# Patient Record
Sex: Male | Born: 1955
Health system: Southern US, Community
[De-identification: ages and names within clinical notes are randomized; demographics above are authoritative.]

## PROBLEM LIST (undated history)

## (undated) DIAGNOSIS — M549 Dorsalgia, unspecified: Secondary | ICD-10-CM

## (undated) DIAGNOSIS — R319 Hematuria, unspecified: Secondary | ICD-10-CM

## (undated) DIAGNOSIS — G8929 Other chronic pain: Secondary | ICD-10-CM

## (undated) DIAGNOSIS — C61 Malignant neoplasm of prostate: Secondary | ICD-10-CM

## (undated) DIAGNOSIS — I10 Essential (primary) hypertension: Secondary | ICD-10-CM

## (undated) DIAGNOSIS — G709 Myoneural disorder, unspecified: Secondary | ICD-10-CM

## (undated) DIAGNOSIS — J302 Other seasonal allergic rhinitis: Secondary | ICD-10-CM

## (undated) DIAGNOSIS — C801 Malignant (primary) neoplasm, unspecified: Secondary | ICD-10-CM

## (undated) DIAGNOSIS — E039 Hypothyroidism, unspecified: Secondary | ICD-10-CM

## (undated) HISTORY — PX: BACK SURGERY: SHX140

## (undated) HISTORY — PX: KNEE ARTHROSCOPY: SUR90

## (undated) HISTORY — PX: NASAL SEPTOPLASTY W/ TURBINOPLASTY: SHX2070

---

## 1980-09-12 HISTORY — PX: APPENDECTOMY: SHX54

## 1998-01-16 ENCOUNTER — Ambulatory Visit (HOSPITAL_COMMUNITY): Admission: RE | Admit: 1998-01-16 | Discharge: 1998-01-16 | Payer: Self-pay | Admitting: Internal Medicine

## 1999-01-27 ENCOUNTER — Ambulatory Visit (HOSPITAL_COMMUNITY): Admission: RE | Admit: 1999-01-27 | Discharge: 1999-01-27 | Payer: Self-pay

## 2000-12-13 ENCOUNTER — Ambulatory Visit (HOSPITAL_COMMUNITY): Admission: RE | Admit: 2000-12-13 | Discharge: 2000-12-13 | Payer: Self-pay | Admitting: Pain Medicine

## 2000-12-13 ENCOUNTER — Encounter: Payer: Self-pay | Admitting: Pain Medicine

## 2002-01-14 ENCOUNTER — Encounter: Payer: Self-pay | Admitting: Pain Medicine

## 2002-01-14 ENCOUNTER — Ambulatory Visit (HOSPITAL_COMMUNITY): Admission: RE | Admit: 2002-01-14 | Discharge: 2002-01-14 | Payer: Self-pay | Admitting: Pain Medicine

## 2002-09-09 ENCOUNTER — Encounter: Payer: Self-pay | Admitting: Pain Medicine

## 2002-09-09 ENCOUNTER — Ambulatory Visit (HOSPITAL_COMMUNITY): Admission: RE | Admit: 2002-09-09 | Discharge: 2002-09-09 | Payer: Self-pay | Admitting: Pain Medicine

## 2006-06-02 ENCOUNTER — Ambulatory Visit (HOSPITAL_COMMUNITY): Admission: RE | Admit: 2006-06-02 | Discharge: 2006-06-02 | Payer: Self-pay | Admitting: Urology

## 2012-08-17 ENCOUNTER — Other Ambulatory Visit (HOSPITAL_COMMUNITY): Payer: Self-pay | Admitting: Urology

## 2012-08-17 DIAGNOSIS — R3129 Other microscopic hematuria: Secondary | ICD-10-CM

## 2012-08-17 DIAGNOSIS — D1800 Hemangioma unspecified site: Secondary | ICD-10-CM

## 2012-08-20 ENCOUNTER — Ambulatory Visit (HOSPITAL_COMMUNITY)
Admission: RE | Admit: 2012-08-20 | Discharge: 2012-08-20 | Disposition: A | Discharge status: Home / Self Care | Payer: 59 | Source: Ambulatory Visit | Attending: Urology | Admitting: Urology

## 2012-08-20 DIAGNOSIS — R3129 Other microscopic hematuria: Secondary | ICD-10-CM | POA: Insufficient documentation

## 2012-08-20 MED ORDER — IOHEXOL 300 MG/ML  SOLN
125.0000 mL | Freq: Once | INTRAMUSCULAR | Status: AC | PRN
  Administered 2012-08-20: 125 mL via INTRAVENOUS

## 2012-10-08 ENCOUNTER — Other Ambulatory Visit: Payer: Self-pay | Admitting: Urology

## 2012-11-12 ENCOUNTER — Encounter (HOSPITAL_COMMUNITY): Payer: Self-pay | Admitting: Pharmacy Technician

## 2012-11-15 ENCOUNTER — Encounter (HOSPITAL_COMMUNITY)
Admission: RE | Admit: 2012-11-15 | Discharge: 2012-11-15 | Disposition: A | Discharge status: Home / Self Care | Payer: 59 | Source: Ambulatory Visit | Attending: Urology | Admitting: Urology

## 2012-11-15 ENCOUNTER — Encounter (HOSPITAL_COMMUNITY): Payer: Self-pay

## 2012-11-15 ENCOUNTER — Ambulatory Visit (HOSPITAL_COMMUNITY)
Admission: RE | Admit: 2012-11-15 | Discharge: 2012-11-15 | Disposition: A | Discharge status: Home / Self Care | Payer: 59 | Source: Ambulatory Visit | Attending: Urology | Admitting: Urology

## 2012-11-15 DIAGNOSIS — Z01810 Encounter for preprocedural cardiovascular examination: Secondary | ICD-10-CM | POA: Insufficient documentation

## 2012-11-15 DIAGNOSIS — Z01818 Encounter for other preprocedural examination: Secondary | ICD-10-CM | POA: Insufficient documentation

## 2012-11-15 DIAGNOSIS — Z01812 Encounter for preprocedural laboratory examination: Secondary | ICD-10-CM | POA: Insufficient documentation

## 2012-11-15 HISTORY — DX: Other seasonal allergic rhinitis: J30.2

## 2012-11-15 HISTORY — DX: Hypothyroidism, unspecified: E03.9

## 2012-11-15 HISTORY — DX: Malignant (primary) neoplasm, unspecified: C80.1

## 2012-11-15 HISTORY — DX: Dorsalgia, unspecified: M54.9

## 2012-11-15 HISTORY — DX: Myoneural disorder, unspecified: G70.9

## 2012-11-15 HISTORY — DX: Essential (primary) hypertension: I10

## 2012-11-15 HISTORY — DX: Other chronic pain: G89.29

## 2012-11-15 LAB — COMPREHENSIVE METABOLIC PANEL
ALT: 19 U/L (ref 0–53)
Albumin: 4.1 g/dL (ref 3.5–5.2)
Alkaline Phosphatase: 86 U/L (ref 39–117)
Chloride: 99 mEq/L (ref 96–112)
GFR calc Af Amer: >90 mL/min (ref >90)
Glucose, Bld: 95 mg/dL (ref 70–99)
Potassium: 4.5 mEq/L (ref 3.5–5.1)
Sodium: 137 mEq/L (ref 135–145)
Total Protein: 7.5 g/dL (ref 6.0–8.3)

## 2012-11-15 LAB — CBC
Hemoglobin: 14.6 g/dL (ref 13.0–17.0)
MCHC: 34 g/dL (ref 30.0–36.0)
WBC: 9.1 10*3/uL (ref 4.0–10.5)

## 2012-11-15 NOTE — Pre-Procedure Instructions (Signed)
11-15-12 EKG/ CXR done today.

## 2012-11-15 NOTE — Patient Instructions (Addendum)
20 Roger Durham  11/15/2012   Your procedure is scheduled on:  3-12 -2014  Report to [facility] at       0600 AM.  Call this number if you have problems the morning of surgery: [phone]  Or Presurgical Testing [phone]([place])   Remember: Follow any bowel prep instructions per MD office.(Drink clear liquids plentiful)    Do not eat food:After Midnight.    Take these medicines the morning of surgery with A SIP OF WATER:    Do not wear jewelry, make-up or nail polish.  Do not wear lotions, powders, or perfumes. You may wear deodorant.  Do not shave 12 hours prior to first CHG shower(legs and under arms).(face and neck okay.)  Do not bring valuables to the hospital.  Contacts, dentures or bridgework,body piercing,  may not be worn into surgery.  Leave suitcase in the car. After surgery it may be brought to your room.  For patients admitted to the hospital, checkout time is 11:00 AM the day of discharge.   Patients discharged the day of surgery will not be allowed to drive home. Must have responsible person with you x 24 hours once discharged.  Name and phone number of your driver: [phone] cell- [name]-spouse  Special Instructions: CHG(Chlorhedine 4%-"Hibiclens","Betasept","Aplicare") Shower Use Special Wash: see special instructions.(avoid face and genitals)   Please read over the following fact sheets that you were given: MRSA Information, Blood Transfusion fact sheet, Incentive Spirometry Instruction.    Failure to follow these instructions may result in Cancellation of your surgery.   Patient signature_______________________________________________________

## 2012-11-20 LAB — TYPE AND SCREEN: Antibody Screen: NEGATIVE

## 2012-11-20 NOTE — H&P (Signed)
History of Present Illness   Roger Durham  presents to discuss his recent prostate ultrasound and biopsy results. He was noted to have an elevated PSA of approximately 5.6. A digital rectal exam he did not have an enlarged prostate but there was a moderate amount of induration involving the right lobe of his prostate. Ultrasound revealed a prostate of approximately 24 g. Unfortunately biopsies were positive primarily on the right side. There 5/6 biopsies were positive. The majority of biopsy showed a Gleason's 4+3 equals 7 cancer with 30-60% core involvement. There was one core of Gleason's 6. On the left side there was one biopsy positive. This was a Gleason's 3+3 equals 6 cancer involving 5% of the core at the left base. He had several additional biopsies that showed high-grade PIN/atypia. Patient is felt to have clinical stage TIIb  tumor.   Past Medical History Problems  1. History of  Hypertension 401.9 2. History of  Hypothyroidism 244.9  Surgical History Problems  1. History of  Back Surgery  Current Meds 1. Adult Aspirin Low Strength 81 MG Oral Tablet Dispersible; Therapy: (Recorded:05Dec2013) to 2. Levofloxacin 500 MG Oral Tablet; TAKE 1 TABLET Daily Start taking the day prior to procedure  date; Therapy: 05Dec2013 to (Evaluate:08Dec2013); Last Rx:05Dec2013 3. Levothyroxine Sodium 75 MCG Oral Tablet; Therapy: (Recorded:05Dec2013) to 4. Lisinopril TABS; Therapy: (Recorded:05Dec2013) to 5. Methadone HCl 10 MG Oral Tablet; Therapy: (Recorded:05Dec2013) to 6. Simvastatin 40 MG Oral Tablet; Therapy: (Recorded:05Dec2013) to  Allergies Medication  1. No Known Drug Allergies  Family History Problems  1. Paternal history of  Death In The Family Father 52yrs 2. Maternal history of  Death In The Family Mother 40yrs 3. Family history of  Family Health Status Number Of Children 1 son and 1 daughter 4. Family history of  No Significant Family History  Social History Problems  1.  Activities Of Daily Living 2. Caffeine Use 3 qd 3. Exercise Habits No formal exercise at this time however patient is independent with all chores around the home and with gardening activities. 4. Living Independently With Spouse 5. Marital History - Currently Married 6. Occupation: retired Garment/textile technologist 7. Self-reliant In Usual Daily Activities 8. Tobacco Use 305.1 1/2 ppd for 40yrs Denied  9. History of  Alcohol Use  Review of Systems Genitourinary, constitutional, skin, eye, otolaryngeal, hematologic/lymphatic, cardiovascular, pulmonary, endocrine, musculoskeletal, gastrointestinal, neurological and psychiatric system(s) were reviewed and pertinent findings if present are noted.  Genitourinary: urinary frequency, feelings of urinary urgency and erectile dysfunction.  Gastrointestinal: no flank pain and no abdominal pain.  Musculoskeletal: back pain.   Physical Exam Constitutional: Well nourished and well developed . No acute distress.  ENT:. The ears and nose are normal in appearance.  Neck: The appearance of the neck is normal and no neck mass is present.  Pulmonary: No respiratory distress and normal respiratory rhythm and effort.  Cardiovascular: Heart rate and rhythm are normal . No peripheral edema.  Abdomen: The abdomen is soft and nontender. No masses are palpated. No CVA tenderness. No hernias are palpable. No hepatosplenomegaly noted.  Rectal: Rectal exam demonstrates normal sphincter tone, no tenderness and no masses. Estimated prostate size is 1+. The prostate has no nodularity, is indurated involving the apex, mid aspect of the prostate which appears to be confined within the prostate capsule and is not tender. The left seminal vesicle is nonpalpable. The right seminal vesicle is nonpalpable. The perineum is normal on inspection.  Genitourinary: Examination of the penis demonstrates no discharge, no masses,  no lesions and a normal meatus. The scrotum is without lesions.  The right epididymis is palpably normal and non-tender. The left epididymis is palpably normal and non-tender. The right testis is non-tender and without masses. The left testis is non-tender and without masses.  Lymphatics: The femoral and inguinal nodes are not enlarged or tender.  Skin: Normal skin turgor, no visible rash and no visible skin lesions.  Neuro/Psych:. Mood and affect are appropriate.     Assessment Assessed  1. Prostate Cancer 185  Plan Prostate Cancer (185)  1. Follow-up Keep Future Appt Office  Follow-up  Requested for: 12Feb2014  Discussion/Summary  The patient was counseled about the natural history of prostate cancer and the standard treatment options that are available for prostate cancer. It was explained to him how his age and life expectancy, clinical stage, Gleason score, and PSA affect his prognosis, the decision to proceed with additional staging studies, as well as how that information influences recommended treatment strategies. We discussed the roles for active surveillance, radiation therapy, surgical therapy, androgen deprivation, as well as ablative therapy options for the treatment of prostate cancer as appropriate to his individual cancer situation. We discussed the risks and benefits of these options with regard to their impact on cancer control and also in terms of potential adverse events, complications, and impact on quiality of life particularly related to urinary, bowel, and sexual function. The patient was encouraged to ask questions throughout the discussion today and all questions were answered to his stated satisfaction. In addition, the patient was provided with and/or directed to appropriate resources and literature for further education about prostate cancer and treatment options.   We discussed surgical therapy for prostate cancer including the different available surgical approaches. We discussed, in detail, the risks and expectations of surgery  with regard to cancer control, urinary control, and erectile function as well as the expected postoperative recovery process. The risks, potential complications/adverse events of radical prostatectomy as well as alternative options were explained to the patient.   We discussed surgical therapy for prostate cancer including the different available surgical approaches. We discussed, in detail, the risks and expectations of surgery with regard to cancer control, urinary control, and erectile function as well as the expected postoperative recovery process. Additional risks of surgery including but not limited to bleeding, infection, hernia formation, nerve damage, lymphocele formation, bowel/rectal injury potentially necessitating colostomy, damage to the urinary tract resulting in urine leakage, urethral stricture, and the cardiopulmonary risks such as myocardial infarction, stroke, death, venothromboembolism, etc. were explained. The risk of open surgical conversion for robotic/laparoscopic prostatectomy was also discussed.   45 minutes were spent in face to face consultation with patient today.    SignaturesElectronically signed by : Barron Alvine, M.D.; Oct 29 2012  9:17AM

## 2012-11-21 ENCOUNTER — Encounter (HOSPITAL_COMMUNITY)
Admission: RE | Disposition: A | Discharge status: Home / Self Care | Payer: Self-pay | Source: Ambulatory Visit | Attending: Urology

## 2012-11-21 ENCOUNTER — Observation Stay (HOSPITAL_COMMUNITY)
Admission: RE | Admit: 2012-11-21 | Discharge: 2012-11-22 | Disposition: A | Discharge status: Home / Self Care | Payer: 59 | Source: Ambulatory Visit | Attending: Urology | Admitting: Urology

## 2012-11-21 ENCOUNTER — Encounter (HOSPITAL_COMMUNITY): Payer: Self-pay | Admitting: Anesthesiology

## 2012-11-21 ENCOUNTER — Encounter (HOSPITAL_COMMUNITY): Payer: Self-pay | Admitting: *Deleted

## 2012-11-21 ENCOUNTER — Ambulatory Visit (HOSPITAL_COMMUNITY): Payer: 59 | Admitting: Anesthesiology

## 2012-11-21 DIAGNOSIS — Z79899 Other long term (current) drug therapy: Secondary | ICD-10-CM | POA: Insufficient documentation

## 2012-11-21 DIAGNOSIS — Z7982 Long term (current) use of aspirin: Secondary | ICD-10-CM | POA: Insufficient documentation

## 2012-11-21 DIAGNOSIS — I10 Essential (primary) hypertension: Secondary | ICD-10-CM | POA: Insufficient documentation

## 2012-11-21 DIAGNOSIS — E039 Hypothyroidism, unspecified: Secondary | ICD-10-CM | POA: Insufficient documentation

## 2012-11-21 DIAGNOSIS — C61 Malignant neoplasm of prostate: Principal | ICD-10-CM | POA: Insufficient documentation

## 2012-11-21 HISTORY — PX: LYMPHADENECTOMY: SHX5960

## 2012-11-21 HISTORY — PX: ROBOT ASSISTED LAPAROSCOPIC RADICAL PROSTATECTOMY: SHX5141

## 2012-11-21 LAB — HEMOGLOBIN AND HEMATOCRIT, BLOOD: HCT: 39.6 % (ref 39.0–52.0)

## 2012-11-21 SURGERY — ROBOTIC ASSISTED LAPAROSCOPIC RADICAL PROSTATECTOMY
Anesthesia: General | Wound class: Clean Contaminated

## 2012-11-21 MED ORDER — ACETAMINOPHEN 10 MG/ML IV SOLN
1000.0000 mg | Freq: Four times a day (QID) | INTRAVENOUS | Status: AC
  Administered 2012-11-21 – 2012-11-22 (×4): 1000 mg via INTRAVENOUS
  Filled 2012-11-21 (×4): qty 100

## 2012-11-21 MED ORDER — PROMETHAZINE HCL 25 MG/ML IJ SOLN: 6.2500 mg | INTRAMUSCULAR | Status: DC | PRN

## 2012-11-21 MED ORDER — MORPHINE SULFATE 2 MG/ML IJ SOLN: 2.0000 mg | INTRAMUSCULAR | Status: DC | PRN

## 2012-11-21 MED ORDER — NEOSTIGMINE METHYLSULFATE 1 MG/ML IJ SOLN
INTRAMUSCULAR | Status: DC | PRN
  Administered 2012-11-21: 4 mg via INTRAVENOUS

## 2012-11-21 MED ORDER — BUPIVACAINE-EPINEPHRINE 0.25% -1:200000 IJ SOLN
INTRAMUSCULAR | Status: DC | PRN
  Administered 2012-11-21: 26 mL

## 2012-11-21 MED ORDER — ROCURONIUM BROMIDE 100 MG/10ML IV SOLN
INTRAVENOUS | Status: DC | PRN
  Administered 2012-11-21: 60 mg via INTRAVENOUS
  Administered 2012-11-21 (×2): 20 mg via INTRAVENOUS

## 2012-11-21 MED ORDER — CIPROFLOXACIN HCL 500 MG PO TABS: 500.0000 mg | ORAL_TABLET | Freq: Two times a day (BID) | ORAL | Status: DC

## 2012-11-21 MED ORDER — ONDANSETRON HCL 4 MG/2ML IJ SOLN
INTRAMUSCULAR | Status: DC | PRN
  Administered 2012-11-21: 4 mg via INTRAVENOUS

## 2012-11-21 MED ORDER — HYDROMORPHONE HCL PF 1 MG/ML IJ SOLN
INTRAMUSCULAR | Status: AC
  Filled 2012-11-21: qty 1

## 2012-11-21 MED ORDER — SIMVASTATIN 20 MG PO TABS
20.0000 mg | ORAL_TABLET | Freq: Every day | ORAL | Status: DC
  Filled 2012-11-21 (×2): qty 1

## 2012-11-21 MED ORDER — LACTATED RINGERS IV SOLN
INTRAVENOUS | Status: DC | PRN
  Administered 2012-11-21: 11:00:00

## 2012-11-21 MED ORDER — SODIUM CHLORIDE 0.9 % IR SOLN
Status: DC | PRN
  Administered 2012-11-21: 1000 mL

## 2012-11-21 MED ORDER — INDIGOTINDISULFONATE SODIUM 8 MG/ML IJ SOLN
INTRAMUSCULAR | Status: AC
  Filled 2012-11-21: qty 10

## 2012-11-21 MED ORDER — ACETAMINOPHEN 10 MG/ML IV SOLN
INTRAVENOUS | Status: DC | PRN
  Administered 2012-11-21: 1000 mg via INTRAVENOUS

## 2012-11-21 MED ORDER — MEPERIDINE HCL 50 MG/ML IJ SOLN: 6.2500 mg | INTRAMUSCULAR | Status: DC | PRN

## 2012-11-21 MED ORDER — STERILE WATER FOR IRRIGATION IR SOLN
Status: DC | PRN
  Administered 2012-11-21: 3000 mL

## 2012-11-21 MED ORDER — HYDROMORPHONE HCL PF 1 MG/ML IJ SOLN
INTRAMUSCULAR | Status: DC | PRN
  Administered 2012-11-21 (×2): 1 mg via INTRAVENOUS

## 2012-11-21 MED ORDER — AMLODIPINE BESY-BENAZEPRIL HCL 5-10 MG PO CAPS: 1.0000 | ORAL_CAPSULE | Freq: Every morning | ORAL | Status: DC

## 2012-11-21 MED ORDER — METHADONE HCL 10 MG PO TABS
10.0000 mg | ORAL_TABLET | Freq: Three times a day (TID) | ORAL | Status: DC
  Filled 2012-11-21: qty 1

## 2012-11-21 MED ORDER — CEFAZOLIN SODIUM-DEXTROSE 2-3 GM-% IV SOLR
2.0000 g | INTRAVENOUS | Status: AC
  Administered 2012-11-21: 2 g via INTRAVENOUS

## 2012-11-21 MED ORDER — CEFAZOLIN SODIUM-DEXTROSE 2-3 GM-% IV SOLR
INTRAVENOUS | Status: AC
  Filled 2012-11-21: qty 50

## 2012-11-21 MED ORDER — KCL IN DEXTROSE-NACL 10-5-0.45 MEQ/L-%-% IV SOLN
INTRAVENOUS | Status: DC
  Administered 2012-11-21 – 2012-11-22 (×3): via INTRAVENOUS
  Filled 2012-11-21 (×5): qty 1000

## 2012-11-21 MED ORDER — MIDAZOLAM HCL 5 MG/5ML IJ SOLN
INTRAMUSCULAR | Status: DC | PRN
  Administered 2012-11-21: 2 mg via INTRAVENOUS

## 2012-11-21 MED ORDER — HEPARIN SODIUM (PORCINE) 1000 UNIT/ML IJ SOLN
INTRAMUSCULAR | Status: AC
  Filled 2012-11-21: qty 1

## 2012-11-21 MED ORDER — PROPOFOL 10 MG/ML IV BOLUS
INTRAVENOUS | Status: DC | PRN
  Administered 2012-11-21: 200 mg via INTRAVENOUS

## 2012-11-21 MED ORDER — FENTANYL CITRATE 0.05 MG/ML IJ SOLN
INTRAMUSCULAR | Status: DC | PRN
  Administered 2012-11-21: 100 ug via INTRAVENOUS
  Administered 2012-11-21: 150 ug via INTRAVENOUS

## 2012-11-21 MED ORDER — ACETAMINOPHEN 10 MG/ML IV SOLN
INTRAVENOUS | Status: AC
  Filled 2012-11-21: qty 100

## 2012-11-21 MED ORDER — LEVOTHYROXINE SODIUM 75 MCG PO TABS
75.0000 ug | ORAL_TABLET | Freq: Every day | ORAL | Status: DC
  Filled 2012-11-21 (×2): qty 1

## 2012-11-21 MED ORDER — BENAZEPRIL HCL 10 MG PO TABS
10.0000 mg | ORAL_TABLET | Freq: Every day | ORAL | Status: DC
  Filled 2012-11-21: qty 1

## 2012-11-21 MED ORDER — LACTATED RINGERS IV SOLN
INTRAVENOUS | Status: DC | PRN
  Administered 2012-11-21 (×2): via INTRAVENOUS

## 2012-11-21 MED ORDER — KETAMINE HCL 50 MG/ML IJ SOLN
INTRAMUSCULAR | Status: DC | PRN
  Administered 2012-11-21: 25 mg via INTRAMUSCULAR

## 2012-11-21 MED ORDER — HYDROMORPHONE HCL PF 1 MG/ML IJ SOLN
0.2500 mg | INTRAMUSCULAR | Status: DC | PRN
  Administered 2012-11-21: 0.5 mg via INTRAVENOUS

## 2012-11-21 MED ORDER — SODIUM CHLORIDE 0.9 % IV BOLUS (SEPSIS)
1000.0000 mL | Freq: Once | INTRAVENOUS | Status: AC
  Administered 2012-11-21: 1000 mL via INTRAVENOUS

## 2012-11-21 MED ORDER — INDIGOTINDISULFONATE SODIUM 8 MG/ML IJ SOLN
INTRAMUSCULAR | Status: DC | PRN
  Administered 2012-11-21 (×2): 5 mL via INTRAVENOUS

## 2012-11-21 MED ORDER — BUPIVACAINE-EPINEPHRINE PF 0.25-1:200000 % IJ SOLN
INTRAMUSCULAR | Status: AC
  Filled 2012-11-21: qty 30

## 2012-11-21 MED ORDER — HEMOSTATIC AGENTS (NO CHARGE) OPTIME
TOPICAL | Status: DC | PRN
  Administered 2012-11-21: 1

## 2012-11-21 MED ORDER — LACTATED RINGERS IV SOLN: INTRAVENOUS | Status: DC

## 2012-11-21 MED ORDER — AMLODIPINE BESYLATE 5 MG PO TABS
5.0000 mg | ORAL_TABLET | Freq: Every day | ORAL | Status: DC
  Filled 2012-11-21: qty 1

## 2012-11-21 MED ORDER — GLYCOPYRROLATE 0.2 MG/ML IJ SOLN
INTRAMUSCULAR | Status: DC | PRN
  Administered 2012-11-21: .5 mg via INTRAVENOUS

## 2012-11-21 MED ORDER — AMLODIPINE BESYLATE 5 MG PO TABS
5.0000 mg | ORAL_TABLET | ORAL | Status: DC
  Filled 2012-11-21: qty 1

## 2012-11-21 MED ORDER — EPHEDRINE SULFATE 50 MG/ML IJ SOLN
INTRAMUSCULAR | Status: DC | PRN
  Administered 2012-11-21 (×2): 5 mg via INTRAVENOUS

## 2012-11-21 MED ORDER — KCL IN DEXTROSE-NACL 10-5-0.45 MEQ/L-%-% IV SOLN
INTRAVENOUS | Status: AC
  Filled 2012-11-21: qty 1000

## 2012-11-21 SURGICAL SUPPLY — 51 items
AGENT HMST MTR 8 SURGIFLO (HEMOSTASIS) ×2
APL ESCP 34 STRL LF DISP (HEMOSTASIS) ×2
APPLICATOR SURGIFLO ENDO (HEMOSTASIS) ×1 IMPLANT
CANISTER SUCTION 2500CC (MISCELLANEOUS) ×3 IMPLANT
CATH FOLEY 2WAY SLVR 18FR 30CC (CATHETERS) ×3 IMPLANT
CATH ROBINSON RED A/P 16FR (CATHETERS) ×3 IMPLANT
CATH ROBINSON RED A/P 8FR (CATHETERS) ×3 IMPLANT
CATH TIEMANN FOLEY 18FR 5CC (CATHETERS) ×3 IMPLANT
CHLORAPREP W/TINT 26ML (MISCELLANEOUS) ×3 IMPLANT
CLIP LIGATING HEM O LOK PURPLE (MISCELLANEOUS) IMPLANT
CLOTH BEACON ORANGE TIMEOUT ST (SAFETY) ×3 IMPLANT
CORD HIGH FREQUENCY UNIPOLAR (ELECTROSURGICAL) ×3 IMPLANT
COVER SURGICAL LIGHT HANDLE (MISCELLANEOUS) ×3 IMPLANT
COVER TIP SHEARS 8 DVNC (MISCELLANEOUS) ×2 IMPLANT
COVER TIP SHEARS 8MM DA VINCI (MISCELLANEOUS) ×1
CUTTER ECHEON FLEX ENDO 45 340 (ENDOMECHANICALS) ×3 IMPLANT
DECANTER SPIKE VIAL GLASS SM (MISCELLANEOUS) ×3 IMPLANT
DRAPE SURG IRRIG POUCH 19X23 (DRAPES) ×3 IMPLANT
DRSG TEGADERM 2-3/8X2-3/4 SM (GAUZE/BANDAGES/DRESSINGS) ×12 IMPLANT
DRSG TEGADERM 4X4.75 (GAUZE/BANDAGES/DRESSINGS) ×6 IMPLANT
DRSG TEGADERM 6X8 (GAUZE/BANDAGES/DRESSINGS) ×9 IMPLANT
DRSG TEGADERM 8X12 (GAUZE/BANDAGES/DRESSINGS) ×2 IMPLANT
ELECT REM PT RETURN 9FT ADLT (ELECTROSURGICAL) ×3
ELECTRODE REM PT RTRN 9FT ADLT (ELECTROSURGICAL) ×2 IMPLANT
GAUZE SPONGE 2X2 8PLY STRL LF (GAUZE/BANDAGES/DRESSINGS) ×2 IMPLANT
GLOVE BIO SURGEON STRL SZ 6.5 (GLOVE) ×3 IMPLANT
GLOVE BIOGEL M STRL SZ7.5 (GLOVE) ×6 IMPLANT
GOWN PREVENTION PLUS XLARGE (GOWN DISPOSABLE) ×4 IMPLANT
GOWN STRL NON-REIN LRG LVL3 (GOWN DISPOSABLE) ×2 IMPLANT
GOWN STRL REIN XL XLG (GOWN DISPOSABLE) ×6 IMPLANT
HOLDER FOLEY CATH W/STRAP (MISCELLANEOUS) ×3 IMPLANT
IV LACTATED RINGERS 1000ML (IV SOLUTION) ×3 IMPLANT
KIT ACCESSORY DA VINCI DISP (KITS) ×1
KIT ACCESSORY DVNC DISP (KITS) ×2 IMPLANT
NDL SAFETY ECLIPSE 18X1.5 (NEEDLE) ×2 IMPLANT
NEEDLE HYPO 18GX1.5 SHARP (NEEDLE) ×3
PACK ROBOT UROLOGY CUSTOM (CUSTOM PROCEDURE TRAY) ×3 IMPLANT
RELOAD GREEN ECHELON 45 (STAPLE) ×3 IMPLANT
SEALER TISSUE G2 CVD JAW 45CM (ENDOMECHANICALS) ×1 IMPLANT
SET TUBE IRRIG SUCTION NO TIP (IRRIGATION / IRRIGATOR) ×3 IMPLANT
SOLUTION ELECTROLUBE (MISCELLANEOUS) ×3 IMPLANT
SPOGE SURGIFLO 8M (HEMOSTASIS) ×1
SPONGE GAUZE 2X2 STER 10/PKG (GAUZE/BANDAGES/DRESSINGS)
SPONGE SURGIFLO 8M (HEMOSTASIS) IMPLANT
SUT ETHILON 3 0 PS 1 (SUTURE) ×1 IMPLANT
SUT VIC AB 2-0 SH 27 (SUTURE) ×3
SUT VIC AB 2-0 SH 27X BRD (SUTURE) ×2 IMPLANT
SUT VICRYL 0 UR6 27IN ABS (SUTURE) ×3 IMPLANT
SYR 27GX1/2 1ML LL SAFETY (SYRINGE) ×3 IMPLANT
TOWEL OR NON WOVEN STRL DISP B (DISPOSABLE) ×3 IMPLANT
WATER STERILE IRR 1500ML POUR (IV SOLUTION) ×4 IMPLANT

## 2012-11-21 NOTE — Progress Notes (Signed)
Patient ID: Roger Durham, male   DOB: May 02, 1956, 57 y.o.   MRN: [id number] Post-op note  Subjective: The patient is doing well.  No complaints.  Denies N/V.  Has not ambulated yet  Objective: Vital signs in last 24 hours: Temp:  [97.3 F (36.3 C)-98 F (36.7 C)] 98 F (36.7 C) (03/12 1331) Pulse Rate:  [66-85] 70 (03/12 1331) Resp:  [9-18] 12 (03/12 1331) BP: (105-144)/(64-75) 105/66 mmHg (03/12 1331) SpO2:  [94 %-100 %] 96 % (03/12 1331) Weight:  [92.987 kg (205 lb)] 92.987 kg (205 lb) (03/12 1331)  Intake/Output from previous day:   Intake/Output this shift: Total I/O In: 3075 [I.V.:2075; IV Piggyback:1000] Out: 205 [Urine:125; Drains:30; Blood:50]  Physical Exam:  General: Alert and oriented. Abdomen: Soft, Nondistended. Incisions: Clean and dry.  Lab Results:  Recent Labs  11/21/12 1149  HGB 13.1  HCT 39.6    Assessment/Plan: POD#0   1) Continue to monitor 2) Amb, IS, pain control, DVT prophy    LOS: 0 days   Silas Flood. 11/21/2012, 2:29 PM

## 2012-11-21 NOTE — Anesthesia Preprocedure Evaluation (Addendum)
Anesthesia Evaluation  Patient identified by MRN, date of birth, ID band Patient awake    Reviewed: Allergy & Precautions, H&P , NPO status , Patient's Chart, lab work & pertinent test results  Airway Mallampati: II TM Distance: >3 FB Neck ROM: Full    Dental no notable dental hx. (+) Edentulous Upper and Edentulous Lower   Pulmonary neg pulmonary ROS, former smoker,  breath sounds clear to auscultation  Pulmonary exam normal       Cardiovascular hypertension, Pt. on medications Rhythm:Regular Rate:Normal     Neuro/Psych negative neurological ROS  negative psych ROS   GI/Hepatic negative GI ROS, Neg liver ROS,   Endo/Other  Hypothyroidism   Renal/GU negative Renal ROS  negative genitourinary   Musculoskeletal   Abdominal   Peds negative pediatric ROS (+)  Hematology negative hematology ROS (+)   Anesthesia Other Findings   Reproductive/Obstetrics negative OB ROS                         Anesthesia Physical Anesthesia Plan  ASA: II  Anesthesia Plan: General   Post-op Pain Management:    Induction: Intravenous  Airway Management Planned: Oral ETT  Additional Equipment:   Intra-op Plan:   Post-operative Plan: Extubation in OR  Informed Consent: I have reviewed the patients History and Physical, chart, labs and discussed the procedure including the risks, benefits and alternatives for the proposed anesthesia with the patient or authorized representative who has indicated his/her understanding and acceptance.   Dental advisory given  Plan Discussed with: CRNA  Anesthesia Plan Comments:         Anesthesia Quick Evaluation

## 2012-11-21 NOTE — Transfer of Care (Signed)
Immediate Anesthesia Transfer of Care Note  Patient: Roger Durham  Procedure(s) Performed: Procedure(s) with comments: ROBOTIC ASSISTED LAPAROSCOPIC RADICAL PROSTATECTOMY (N/A) - WITH BILATERAL PELVIC LYMPH NODE DISSECTION  LYMPHADENECTOMY (Bilateral)  Patient Location: PACU  Anesthesia Type:General  Level of Consciousness: oriented, sedated, patient cooperative and responds to stimulation  Airway & Oxygen Therapy: Patient Spontanous Breathing and Patient connected to face mask oxygen  Post-op Assessment: Report given to PACU RN, Post -op Vital signs reviewed and stable and Patient moving all extremities X 4  Post vital signs: Reviewed and stable  Complications: No apparent anesthesia complications

## 2012-11-21 NOTE — Progress Notes (Signed)
Hgb. And Hct. Drawn by lab. 

## 2012-11-21 NOTE — Anesthesia Postprocedure Evaluation (Signed)
  Anesthesia Post-op Note  Patient: Roger Durham  Procedure(s) Performed: Procedure(s) (LRB): ROBOTIC ASSISTED LAPAROSCOPIC RADICAL PROSTATECTOMY (N/A) LYMPHADENECTOMY (Bilateral)  Patient Location: PACU  Anesthesia Type: General  Level of Consciousness: awake and alert   Airway and Oxygen Therapy: Patient Spontanous Breathing  Post-op Pain: mild  Post-op Assessment: Post-op Vital signs reviewed, Patient's Cardiovascular Status Stable, Respiratory Function Stable, Patent Airway and No signs of Nausea or vomiting  Last Vitals:  Filed Vitals:   11/21/12 1145  BP: 144/74  Pulse: 71  Temp:   Resp: 9    Post-op Vital Signs: stable   Complications: No apparent anesthesia complications

## 2012-11-21 NOTE — Progress Notes (Signed)
Patient ambulated 2x in the hall today.  Tolerated very well.  States pain is manageable and denies needing pain medication.

## 2012-11-21 NOTE — Op Note (Signed)
Preoperative diagnosis: Clinical stage T1c Adenocarcinoma prostate  Postoperative diagnosis: Same  Procedure: Robotic-assisted laparoscopic radical retropubic prostatectomy with bilateral pelvic lymph node dissection  Surgeon: Valetta Fuller, MD  Asst.: Pecola Leisure, PA Anesthesia: Gen. Endotracheal  Indications: Patient was diagnosed with clinical stage TIc Adenocarcinoma the prostate. He underwent extensive consultation with regard to treatment options. The patient decided on a surgical approach. He appeared to understand the distinct advantages as well as the disadvantages of this procedure. The patient has performed a mechanical bowel prep. He has had placement of PAS compression boots and has received perioperative antibiotics. The patient's preoperative PSA was 5.6. Ultrasound revealed a 23 g prostate.   Technique and findings:The patient was brought to the operating room and had successful induction of general endotracheal anesthesia.the patient was placed in a low lithotomy position with careful padding of all extremities. He was secured to the operative table and placed in the steep Trendelenburg position. He was prepped and draped in usual manner. A Foley catheter was placed sterilely on the field. Camera port site was chosen 18 cm above the pubic symphysis just to the left of the umbilicus. A standard open Hassan technique was utilized. A 12 mm trocar was placed without difficulty. The camera was then inserted and no abnormalities were noted within the pelvis. The trochars were placed with direct visual guidance. This included 3 8mm robotic trochars and a 12 mm and 5 mm assist ports. Once all the ports were placed the robot was docked. The bladder was filled and the space of Retzius was developed with electrocautery dissection as well as blunt dissection. Superficial fat off the endopelvic fascia and bladder neck was removed with electrocautery scissors. The endopelvic fascia was then incised  bilaterally from base to apex. Levator musculature was swept off the apex of the prostate isolating the dorsal venous complex which was then stapled with the ETS stapling device. The anterior bladder neck was identified with the aid of the Foley balloon. This was then transected down to the Foley catheter with electrocautery scissors. The Foley catheter was then retracted anteriorly. Indigo carmine was given and we appeared to be well away from the ureteral orifices. The posterior bladder neck was then transected and the dissection carried down to the adnexal structures. The seminal vesicles and vas deferens on both sides were then individually dissected free and retracted anteriorly. The posterior plane between the rectum and prostate was then established primarily with blunt dissection.  Attention was then turned towards nerve sparing. The patient was felt to be a candidate for left-sided nerve sparing and wide excision on the right. Superficial fascia along the anterior lateral aspect of the prostate was incised on the left side. This tissue was then swept laterally until we were able to establish a groove between the neurovascular tissue and the posterior lateral aspect on the prostate. This groove was then extended from the apex back to the base of the prostate. With the prostate retracted anteriorly the vascular pedicles of the prostate were taken with the Enseal device. The Foley catheter was then reinserted and the anterior urethra was transected. The posterior urethra was then transected as were some rectourethralis fibers. The prostate was then removed from the pelvis. The pelvis was then copiously irrigated. Rectal insufflation was performed and there was no evidence of rectal injury.  Attention was then turned towards bilateral pelvic lymph node dissection. The obturator node packets were removed I laterally and the dissection extended towards the bifurcation of the iliac  artery. The obturator nerve  was identified on both sides and preserved. Hemalock clips were used for small veins and lymphatic channels. The node packets were sent for permanent analysis.  Attention was then turned towards reconstruction. The bladder neck did not require any reconstruction. The bladder neck and posterior urethra were reapproximated at the 6:00 position utilizing a 2-0 Vicryl suture. The rest of the anastomosis was done with a double-armed 3-0 Monocryl suture in a 360 degree manner. Additional indigo carmine was given. A new catheter was placed and bladder irrigation revealed no evidence of leakage. A Blake drain was placed through one of the robotic trochars and positioned in the retropubic space above the anastomosis. This was then secured to the skin with a nylon suture. The prostate was placed in the Endopouch retrieval bag. The 12 mm trocar site was closed with a Vicryl suture with the aid of a suture passer. Our other trochars were taken out with direct visual guidance without evidence of any bleeding. The camera port incision was extended slightly to allow for removal of the specimen and then closed with a running Vicryl suture. All port sites were infiltrated with Marcaine and then closed with surgical clips. The patient was then taken to recovery room having had no obvious complications or problems. Sponge and needle counts were correct.

## 2012-11-21 NOTE — Progress Notes (Signed)
Amlodipine omitted because patient took his at home this am.

## 2012-11-21 NOTE — Preoperative (Signed)
Beta Blockers   Reason not to administer Beta Blockers:Not Applicable 

## 2012-11-21 NOTE — Interval H&P Note (Signed)
History and Physical Interval Note:  11/21/2012 8:22 AM  Roger Durham  has presented today for surgery, with the diagnosis of PROSTATE CANCER  The various methods of treatment have been discussed with the patient and family. After consideration of risks, benefits and other options for treatment, the patient has consented to  Procedure(s) with comments: ROBOTIC ASSISTED LAPAROSCOPIC RADICAL PROSTATECTOMY (N/A) - WITH BILATERAL PELVIC LYMPH NODE DISSECTION  LYMPHADENECTOMY (Bilateral) as a surgical intervention .  The patient's history has been reviewed, patient examined, no change in status, stable for surgery.  I have reviewed the patient's chart and labs.  Questions were answered to the patient's satisfaction.     GRAPEY,DAVID S

## 2012-11-21 NOTE — Progress Notes (Signed)
Lab results noted- Hgb. 13.1- Hct. 39.6

## 2012-11-22 ENCOUNTER — Encounter (HOSPITAL_COMMUNITY): Payer: Self-pay | Admitting: Urology

## 2012-11-22 LAB — CREATININE, FLUID (PLEURAL, PERITONEAL, JP DRAINAGE): Creat, Fluid: 0.7 mg/dL

## 2012-11-22 LAB — BASIC METABOLIC PANEL
BUN: 10 mg/dL (ref 6–23)
Calcium: 8.7 mg/dL (ref 8.4–10.5)
GFR calc non Af Amer: >90 mL/min (ref >90)
Glucose, Bld: 118 mg/dL — ABNORMAL HIGH (ref 70–99)

## 2012-11-22 MED ORDER — BISACODYL 10 MG RE SUPP
10.0000 mg | Freq: Once | RECTAL | Status: AC
  Administered 2012-11-22: 10 mg via RECTAL
  Filled 2012-11-22: qty 1

## 2012-11-22 NOTE — Progress Notes (Signed)
1 Day Post-Op Subjective: Patient reports pain control good.He has ambulated several times. No real complaints.  Objective: Vital signs in last 24 hours: Temp:  [97.3 F (36.3 C)-98.8 F (37.1 C)] 98.1 F (36.7 C) (03/13 0539) Pulse Rate:  [63-79] 67 (03/13 0539) Resp:  [9-18] 16 (03/13 0539) BP: (105-144)/(59-77) 124/65 mmHg (03/13 0539) SpO2:  [94 %-100 %] 95 % (03/13 0539) Weight:  [92.987 kg (205 lb)] 92.987 kg (205 lb) (03/12 1331)  Intake/Output from previous day: 03/12 0701 - 03/13 0700 In: 6211.3 [P.O.:630; I.V.:4281.3; IV Piggyback:1300] Out: 3330 [Urine:2925; Drains:355; Blood:50] Intake/Output this shift:    Physical Exam:  General:alert and cooperative GI:  soft Male genitalia: not done Penis: catheter draining clear urine   Lab Results:  Recent Labs  11/21/12 1149 11/22/12 0430  HGB 13.1 14.1  HCT 39.6 41.7   BMET  Recent Labs  11/22/12 0430  NA 137  K 4.2  CL 103  CO2 26  GLUCOSE 118*  BUN 10  CREATININE 0.75  CALCIUM 8.7   No results found for this basename: LABPT, INR,  in the last 72 hours No results found for this basename: LABURIN,  in the last 72 hours Results for orders placed during the hospital encounter of 11/15/12  SURGICAL PCR SCREEN     Status: None   Collection Time    11/15/12  3:34 PM      Result Value Range Status   MRSA, PCR NEGATIVE  NEGATIVE Final   Staphylococcus aureus NEGATIVE  NEGATIVE Final   Comment:            The Xpert SA Assay (FDA     approved for NASAL specimens     in patients over 31 years of age),     is one component of     a comprehensive surveillance     program.  Test performance has     been validated by [employer] for patients greater     than or equal to 58 year old.     It is not intended     to diagnose infection nor to     guide or monitor treatment.    Studies/Results: No results found.  Assessment/Plan: 1 Day Post-Op Procedure(s) (LRB): ROBOTIC ASSISTED LAPAROSCOPIC  RADICAL PROSTATECTOMY (N/A) LYMPHADENECTOMY (Bilateral)  Clear liquids  Doubt urine leak but will send stat JP creat  Probable d/c home later today  Routine post-op   LOS: 1 day   GRAPEY,DAVID S 11/22/2012, 8:03 AM

## 2012-11-22 NOTE — Progress Notes (Signed)
Went to bedside to speak with patient and patient's wife to explain Link to [employer] as a benefit of having [employer]. Patient's wife is an Sports administrator [employer]. Patient reports he does not have any Link to Wellness needs at this time. Does not think he needs a post transition of care call either. Appreciative of visit. Left contact information with wife. Raiford Noble, MSN-Ed, RN,BSN, [facility], [phone]

## 2012-11-23 NOTE — Discharge Summary (Signed)
  Date of admission: 11/21/2012  Date of discharge: 11/23/2012  Admission diagnosis: Prostate Cancer  Discharge diagnosis: Prostate Cancer  History and Physical: For full details, please see admission history and physical. Briefly, Roger Durham is a 57 y.o. gentleman with localized prostate cancer.  After discussing management/treatment options, he elected to proceed with surgical treatment.  Hospital Course: Roger Durham was taken to the operating room on 11/21/2012 and underwent a robotic assisted laparoscopic radical prostatectomy. He tolerated this procedure well and without complications. Postoperatively, he was able to be transferred to a regular hospital room following recovery from anesthesia.  He was able to begin ambulating the night of surgery. He remained hemodynamically stable overnight.  He had excellent urine output.  He had increased drainage from the JP, therefore, a Cr level was checked.  This was found to be consistent with serum and the drain was d/c'd on POD #1.  He was transitioned to oral pain medication, tolerated a clear liquid diet, and had met all discharge criteria and was able to be discharged home later on POD#1.  Laboratory values:  Recent Labs  11/21/12 1149 11/22/12 0430  HGB 13.1 14.1  HCT 39.6 41.7    Disposition: Home  Discharge instruction: He was instructed to be ambulatory but to refrain from heavy lifting, strenuous activity, or driving. He was instructed on urethral catheter care.  Discharge medications:     Medication List    STOP taking these medications       aspirin EC 81 MG tablet      TAKE these medications       amLODipine-benazepril 5-10 MG per capsule  Commonly known as:  LOTREL  Take 1 capsule by mouth every morning.     ciprofloxacin 500 MG tablet  Commonly known as:  CIPRO  Take 1 tablet (500 mg total) by mouth 2 (two) times daily. Start day prior to office visit for foley removal     levothyroxine 75 MCG tablet  Commonly  known as:  SYNTHROID, LEVOTHROID  Take 75 mcg by mouth every morning.     methadone 10 MG tablet  Commonly known as:  DOLOPHINE  Take 10 mg by mouth 3 (three) times daily. For pain by pain management center     simvastatin 20 MG tablet  Commonly known as:  ZOCOR  Take 20 mg by mouth every evening.        Followup: He will followup in 1 week for catheter removal and to discuss his surgical pathology results.

## 2014-03-17 ENCOUNTER — Other Ambulatory Visit: Payer: Self-pay | Admitting: Gastroenterology

## 2015-09-30 ENCOUNTER — Encounter: Payer: Self-pay | Admitting: Radiation Oncology

## 2015-09-30 NOTE — Progress Notes (Signed)
GU Location of Tumor / Histology: prostatic adenocarcinoma with biochemical reoccurence  If Prostate Cancer, Gleason Score is (4 + 3) and PSA is (0.14) on 09/09/2015  Roger Durham is three years status post prostatectomy. His PSA was undetectable until May of 2016 when it proved to be 0.02 then in November 0.08. PSA 09/08/15 0.14.    Past/Anticipated interventions by urology, if any: prostatectomy, routine PSA checks, referral to radiation oncology for consideration of salvage radiotherapy  Past/Anticipated interventions by medical oncology, if any: no  Weight changes, if any: no  Bowel/Bladder complaints, if any: urinary frequency worse at night, feelings of intermittent urinary urgency, nocturia x 1-2 and mild erectile dysfunction, no dysuria, no hematuria, describes a strong steady urine stream, denies difficulty emptying his bladder, denies incontinence or leakage  Nausea/Vomiting, if any: no  Pain issues, if any:  Chronic back pain related to injury from 1994; manages back pain with methadone 10 mg tid.   SAFETY ISSUES:  Prior radiation? no  Pacemaker/ICD? no  Possible current pregnancy? no  Is the patient on methotrexate? no  Current Complaints / other details:  60 year old. Retired Music therapist. Married with one son and one daughter.

## 2015-10-01 ENCOUNTER — Ambulatory Visit
Admission: RE | Admit: 2015-10-01 | Discharge: 2015-10-01 | Disposition: A | Discharge status: Home / Self Care | Payer: 59 | Source: Ambulatory Visit | Attending: Radiation Oncology | Admitting: Radiation Oncology

## 2015-10-01 ENCOUNTER — Encounter: Payer: Self-pay | Admitting: Radiation Oncology

## 2015-10-01 VITALS — BP 126/75 | HR 66 | Resp 16 | Ht 67.0 in | Wt 206.0 lb

## 2015-10-01 DIAGNOSIS — M5412 Radiculopathy, cervical region: Secondary | ICD-10-CM | POA: Diagnosis not present

## 2015-10-01 DIAGNOSIS — M545 Low back pain: Secondary | ICD-10-CM | POA: Insufficient documentation

## 2015-10-01 DIAGNOSIS — E039 Hypothyroidism, unspecified: Secondary | ICD-10-CM | POA: Diagnosis not present

## 2015-10-01 DIAGNOSIS — M961 Postlaminectomy syndrome, not elsewhere classified: Secondary | ICD-10-CM | POA: Diagnosis not present

## 2015-10-01 DIAGNOSIS — C61 Malignant neoplasm of prostate: Secondary | ICD-10-CM

## 2015-10-01 DIAGNOSIS — M542 Cervicalgia: Secondary | ICD-10-CM | POA: Diagnosis not present

## 2015-10-01 DIAGNOSIS — Z510 Encounter for antineoplastic radiation therapy: Secondary | ICD-10-CM | POA: Insufficient documentation

## 2015-10-01 DIAGNOSIS — G8929 Other chronic pain: Secondary | ICD-10-CM | POA: Insufficient documentation

## 2015-10-01 DIAGNOSIS — R9721 Rising PSA following treatment for malignant neoplasm of prostate: Secondary | ICD-10-CM | POA: Diagnosis not present

## 2015-10-01 DIAGNOSIS — I10 Essential (primary) hypertension: Secondary | ICD-10-CM | POA: Insufficient documentation

## 2015-10-01 DIAGNOSIS — Z87891 Personal history of nicotine dependence: Secondary | ICD-10-CM | POA: Diagnosis not present

## 2015-10-01 DIAGNOSIS — M5440 Lumbago with sciatica, unspecified side: Secondary | ICD-10-CM | POA: Diagnosis not present

## 2015-10-01 DIAGNOSIS — Z9889 Other specified postprocedural states: Secondary | ICD-10-CM | POA: Insufficient documentation

## 2015-10-01 DIAGNOSIS — G709 Myoneural disorder, unspecified: Secondary | ICD-10-CM | POA: Diagnosis not present

## 2015-10-01 HISTORY — DX: Malignant neoplasm of prostate: C61

## 2015-10-01 NOTE — Progress Notes (Signed)
Radiation Oncology         [phone] ________________________________  Initial outpatient Consultation  Name: Roger Durham MRN: [id number]  Date: 10/01/2015  DOB: [birth year]  [id number] Roger John, MD  [name] Snare, MD   REFERRING PHYSICIAN: [name] Snare, MD  DIAGNOSIS: The encounter diagnosis was Prostate cancer [facility]).    ICD-9-CM ICD-10-CM   1. Prostate cancer ([place]) [place] is a pleasant 61 y.o. male with a history of stage IIB adenocarcinoma of the prostate. In 2014 he was found to have an adenocarcinoma of the prostate, and underwent a radical prostatectomy and final pathology revealed adenocarcinoma with Gleason score of 4+3. Margins were negative and he did not have any extra prostatitic disease. His PSA has been followed since that time, and had been undetectable until November 2016 when this was 0.08. On 09/08/2015 this had increased to 0.14. He comes to discuss the role of radiotherapy to the prosthetic fossa with Dr. Tammi Klippel.  PREVIOUS RADIATION THERAPY: No  PAST MEDICAL HISTORY:  has a past medical history of Chronic back pain; Hypertension; Hypothyroidism; Seasonal allergies; Neuromuscular disorder ([place]); Cancer [facility]); and Prostate cancer ([place]).    PAST SURGICAL HISTORY: Past Surgical History  Procedure Laterality Date  . Back surgery      multiple back surgery-retained hardware '08-Ray cage  . Nasal septoplasty w/ turbinoplasty      Seasonal allergies  . Appendectomy    . Knee arthroscopy      left knee-torn menicus  . Robot assisted laparoscopic radical prostatectomy N/A 11/21/2012    Procedure: ROBOTIC ASSISTED LAPAROSCOPIC RADICAL PROSTATECTOMY;  Surgeon: Bernestine Amass, MD;  Location: [facility] ORS;  Service: Urology;  Laterality: N/A;  WITH BILATERAL PELVIC LYMPH NODE DISSECTION   . Lymphadenectomy Bilateral 11/21/2012    Procedure: LYMPHADENECTOMY;  Surgeon: Bernestine Amass, MD;  Location: [facility];  Service: Urology;   Laterality: Bilateral;    FAMILY HISTORY: family history includes Cancer in his brother.  SOCIAL HISTORY:  Social History   Social History  . Marital Status: Married    Spouse Name: N/A  . Number of Children: N/A  . Years of Education: N/A   Occupational History  . Not on file.   Social History Main Topics  . Smoking status: Former Smoker -- 0.50 packs/day for 25 years    Types: Cigarettes    Quit date: 09/12/2009  . Smokeless tobacco: Never Used     Comment: uses in past yr-electronic cigarette  . Alcohol Use: No     Comment: occ. beer  . Drug Use: No  . Sexual Activity: Yes   Other Topics Concern  . Not on file   Social History Narrative   He is a retired Immunologist.  ALLERGIES: Penicillins  MEDICATIONS:  Current Outpatient Prescriptions  Medication Sig Dispense Refill  . amLODipine-benazepril (LOTREL) 5-10 MG per capsule Take 1 capsule by mouth every morning.    Marland Kitchen aspirin 81 MG tablet Take 81 mg by mouth daily.    Marland Kitchen levothyroxine (SYNTHROID, LEVOTHROID) 75 MCG tablet Take 75 mcg by mouth every morning.    . methadone (DOLOPHINE) 10 MG tablet Take 10 mg by mouth 3 (three) times daily. For pain by pain management center    . simvastatin (ZOCOR) 20 MG tablet Take 20 mg by mouth every evening.     No current facility-administered medications for this encounter.    REVIEW OF SYSTEMS:  On review of systems, the patient reports  that he has not had any trouble with dysuria, hematuria, urinary frequency, dribbling or incontinence. He has noticed ED for sever years and describes this as frequent but not during every encounter. He also has noticed some viscous ejaculate in the past month or so, where prior to that time after hs prostatectomy he had none. He denies any new body aches or pains but has a history of multiple back surgeries and has chronic low back pain managed by Dr. Brayton Mars. He has been on the same dose of Methadone for many years with good relief. He denies chest  pain, shortness of breath, fevers, unintended weight changes, nausea, vomiting, or bowel dysfunction. A complete review of systems is obtained and is otherwise negative.   PHYSICAL EXAM:  height is 5\' 7"  (1.702 m) and weight is 206 lb (93.441 kg). His blood pressure is 126/75 and his pulse is 66. His respiration is 16 and oxygen saturation is 100%.   Pain scale 0/10  In general this is a well appearing middle [place] male in no acute distress. He is alert and oriented x4 and appropriate during the encounter. Cardiovascular exam reveals a regular rhythm, no C/R/M. Chest is clear to ausculation bilaterally. No adenopathy is noted of the supraclavicular, cervical, or axillary chains bilaterally. Abdomen revels active bowel sounds in all quadrants, is soft, nontender, nondistended. No hepatosplenomegaly is noted and no fascial defects are present.  KPS = 100  100 - Normal; no complaints; no evidence of disease. 90   - Able to carry on normal activity; minor signs or symptoms of disease. 80   - Normal activity with effort; some signs or symptoms of disease. 49   - Cares for self; unable to carry on normal activity or to do active work. 60   - Requires occasional assistance, but is able to care for most of his personal needs. 50   - Requires considerable assistance and frequent medical care. 8   - Disabled; requires special care and assistance. 59   - Severely disabled; hospital admission is indicated although death not imminent. 55   - Very sick; hospital admission necessary; active supportive treatment necessary. 10   - Moribund; fatal processes progressing rapidly. 0     - Dead  Karnofsky DA, Abelmann Lansford, Craver LS and [place] [facility] [phone]) The use of the nitrogen mustards in the palliative treatment of carcinoma: with particular reference to bronchogenic carcinoma Cancer 1 634-56  LABORATORY DATA:  Lab Results  Component Value Date   WBC 9.1 11/15/2012   HGB 14.1 11/22/2012   HCT 41.7  11/22/2012   MCV 84.8 11/15/2012   PLT 242 11/15/2012   Lab Results  Component Value Date   NA 137 11/22/2012   K 4.2 11/22/2012   CL 103 11/22/2012   CO2 26 11/22/2012   Lab Results  Component Value Date   ALT 19 11/15/2012   AST 24 11/15/2012   ALKPHOS 86 11/15/2012   BILITOT 0.4 11/15/2012     RADIOGRAPHY: none    IMPRESSION: History of 2B adenocarcinoma of the prostate s/p radical prostatectomy with rising PSA.  PLAN: Dr. Tammi Klippel discusses the patient's history, original pathology, and discusses the rising PSA. He reviews that the most likely source of the patient's PSA is a microscopic recurrence within the prostatic fossa. Considering the patient's preop and postop Gleason score and preop PSA, he recommends proceeding with radiotherapy to the prostatic fossa for about  6 weeks. He also reviews the options for considering androgen deprivation  therapy, but given his original tumor was not high risk, we feel that the risks of using this at this time outweigh the potential benefit. The patient is in agreement and would like to move forward with simulation and subsequent therapy. We discussed the risks, benefits, and expectations of radiotherapy. We will contact him to set up simulation to occur next week.    The above documentation reflects my direct findings during this shared patient visit. Please see the separate note by Dr. Tammi Klippel on this date for the remainder of the patient's plan of care.  Carola Rhine, PAC

## 2015-10-01 NOTE — Progress Notes (Signed)
See progress note under physician encounter. 

## 2015-10-01 NOTE — Patient Instructions (Signed)
Contact our office if you have any questions following today's appointment: [phone].  

## 2015-10-04 MED FILL — METHADONE HCL 10 MG TABLET: 10 | 30 days supply | Qty: 90 | Fill #0

## 2015-10-09 ENCOUNTER — Ambulatory Visit
Admission: RE | Admit: 2015-10-09 | Discharge: 2015-10-09 | Disposition: A | Discharge status: Home / Self Care | Payer: 59 | Source: Ambulatory Visit | Attending: Radiation Oncology | Admitting: Radiation Oncology

## 2015-10-09 ENCOUNTER — Other Ambulatory Visit: Payer: Self-pay | Admitting: Radiation Oncology

## 2015-10-09 DIAGNOSIS — E039 Hypothyroidism, unspecified: Secondary | ICD-10-CM | POA: Diagnosis not present

## 2015-10-09 DIAGNOSIS — C61 Malignant neoplasm of prostate: Secondary | ICD-10-CM

## 2015-10-09 DIAGNOSIS — G709 Myoneural disorder, unspecified: Secondary | ICD-10-CM | POA: Diagnosis not present

## 2015-10-09 DIAGNOSIS — I10 Essential (primary) hypertension: Secondary | ICD-10-CM | POA: Diagnosis not present

## 2015-10-09 DIAGNOSIS — M545 Low back pain: Secondary | ICD-10-CM | POA: Diagnosis not present

## 2015-10-09 DIAGNOSIS — G8929 Other chronic pain: Secondary | ICD-10-CM | POA: Diagnosis not present

## 2015-10-09 DIAGNOSIS — Z510 Encounter for antineoplastic radiation therapy: Secondary | ICD-10-CM | POA: Diagnosis not present

## 2015-10-09 DIAGNOSIS — R9721 Rising PSA following treatment for malignant neoplasm of prostate: Secondary | ICD-10-CM | POA: Diagnosis not present

## 2015-10-09 DIAGNOSIS — Z87891 Personal history of nicotine dependence: Secondary | ICD-10-CM | POA: Diagnosis not present

## 2015-10-09 LAB — TSH: TSH: 5.345 m(IU)/L — ABNORMAL HIGH (ref 0.320–4.118)

## 2015-10-09 NOTE — Progress Notes (Signed)
  Radiation Oncology         [phone] ________________________________  Name: Roger Durham MRN: [id number]  Date: 10/09/2015  DOB: 09-20-1955  SIMULATION AND TREATMENT PLANNING NOTE    ICD-9-CM ICD-10-CM   1. Prostate cancer ([place]) [place]     DIAGNOSIS: Roger Durham is a pleasant 60 y.o. male with a history of stage IIB adenocarcinoma of the prostate with a Gleason's score of 4+3 and most recent PSA of 0.14.  NARRATIVE:  The patient was brought to the [place]. Identity was confirmed.  All relevant records and images related to the planned course of therapy were reviewed.  The patient freely provided informed written consent to proceed with treatment after reviewing the details related to the planned course of therapy. The consent form was witnessed and verified by the simulation staff.  Then, the patient was set-up in a stable reproducible supine position for radiation therapy.  A vacuum lock pillow device was custom fabricated to position his legs in a reproducible immobilized position.  Then, I performed a urethrogram under sterile conditions to identify the prostatic apex.  CT images were obtained.  Surface markings were placed.  The CT images were loaded into the planning software.  Then the prostate target and avoidance structures including the rectum, bladder, bowel and hips were contoured.  Treatment planning then occurred.  The radiation prescription was entered and confirmed.  A total of 1 complex treatment device was fabricated. I have requested : Intensity Modulated Radiotherapy (IMRT) is medically necessary for this case for the following reason:  Rectal sparing.Marland Kitchen  PLAN:  The patient will receive 68.4 Gy in 38 fractions.  ________________________________  Sheral Apley Tammi Klippel, M.D.

## 2015-10-10 LAB — PSA: Prostate Specific Ag, Serum: 0.2 ng/mL (ref 0.0–4.0)

## 2015-10-12 ENCOUNTER — Telehealth: Payer: Self-pay | Admitting: Radiation Oncology

## 2015-10-12 MED FILL — LEVOTHYROXINE 88 MCG TABLET: 88 | 30 days supply | Qty: 30 | Fill #2

## 2015-10-12 NOTE — Telephone Encounter (Signed)
Phoned patient as requested by Shona Simpson, PA-C. Per Alison's order informed patient his PSA 3 days ago was 0.2 and TSH 5.345. Additionally, explained to the patient that Dr. Tammi Klippel is OK with his results and a copy of his TSH was forwarded onto Dr. Laurann Montana. Patient verbalized understanding and expressed appreciation for the call.

## 2015-10-13 DIAGNOSIS — G709 Myoneural disorder, unspecified: Secondary | ICD-10-CM | POA: Diagnosis not present

## 2015-10-13 DIAGNOSIS — Z87891 Personal history of nicotine dependence: Secondary | ICD-10-CM | POA: Diagnosis not present

## 2015-10-13 DIAGNOSIS — R9721 Rising PSA following treatment for malignant neoplasm of prostate: Secondary | ICD-10-CM | POA: Diagnosis not present

## 2015-10-13 DIAGNOSIS — E039 Hypothyroidism, unspecified: Secondary | ICD-10-CM | POA: Diagnosis not present

## 2015-10-13 DIAGNOSIS — I10 Essential (primary) hypertension: Secondary | ICD-10-CM | POA: Diagnosis not present

## 2015-10-13 DIAGNOSIS — C61 Malignant neoplasm of prostate: Secondary | ICD-10-CM | POA: Diagnosis not present

## 2015-10-13 DIAGNOSIS — G8929 Other chronic pain: Secondary | ICD-10-CM | POA: Diagnosis not present

## 2015-10-13 DIAGNOSIS — M545 Low back pain: Secondary | ICD-10-CM | POA: Diagnosis not present

## 2015-10-13 DIAGNOSIS — Z510 Encounter for antineoplastic radiation therapy: Secondary | ICD-10-CM | POA: Diagnosis not present

## 2015-10-14 DIAGNOSIS — C61 Malignant neoplasm of prostate: Secondary | ICD-10-CM | POA: Diagnosis not present

## 2015-10-15 DIAGNOSIS — I10 Essential (primary) hypertension: Secondary | ICD-10-CM | POA: Diagnosis not present

## 2015-10-15 DIAGNOSIS — Z510 Encounter for antineoplastic radiation therapy: Secondary | ICD-10-CM | POA: Diagnosis not present

## 2015-10-15 DIAGNOSIS — R9721 Rising PSA following treatment for malignant neoplasm of prostate: Secondary | ICD-10-CM | POA: Diagnosis not present

## 2015-10-15 DIAGNOSIS — C61 Malignant neoplasm of prostate: Secondary | ICD-10-CM | POA: Diagnosis not present

## 2015-10-15 DIAGNOSIS — M545 Low back pain: Secondary | ICD-10-CM | POA: Diagnosis not present

## 2015-10-15 DIAGNOSIS — E039 Hypothyroidism, unspecified: Secondary | ICD-10-CM | POA: Diagnosis not present

## 2015-10-15 DIAGNOSIS — G709 Myoneural disorder, unspecified: Secondary | ICD-10-CM | POA: Diagnosis not present

## 2015-10-15 DIAGNOSIS — Z87891 Personal history of nicotine dependence: Secondary | ICD-10-CM | POA: Diagnosis not present

## 2015-10-15 DIAGNOSIS — G8929 Other chronic pain: Secondary | ICD-10-CM | POA: Diagnosis not present

## 2015-10-20 ENCOUNTER — Ambulatory Visit
Admission: RE | Admit: 2015-10-20 | Discharge: 2015-10-20 | Disposition: A | Discharge status: Home / Self Care | Payer: 59 | Source: Ambulatory Visit | Attending: Radiation Oncology | Admitting: Radiation Oncology

## 2015-10-20 DIAGNOSIS — R9721 Rising PSA following treatment for malignant neoplasm of prostate: Secondary | ICD-10-CM | POA: Diagnosis not present

## 2015-10-20 DIAGNOSIS — I10 Essential (primary) hypertension: Secondary | ICD-10-CM | POA: Diagnosis not present

## 2015-10-20 DIAGNOSIS — Z510 Encounter for antineoplastic radiation therapy: Secondary | ICD-10-CM | POA: Diagnosis not present

## 2015-10-20 DIAGNOSIS — Z87891 Personal history of nicotine dependence: Secondary | ICD-10-CM | POA: Diagnosis not present

## 2015-10-20 DIAGNOSIS — M545 Low back pain: Secondary | ICD-10-CM | POA: Diagnosis not present

## 2015-10-20 DIAGNOSIS — E039 Hypothyroidism, unspecified: Secondary | ICD-10-CM | POA: Diagnosis not present

## 2015-10-20 DIAGNOSIS — G709 Myoneural disorder, unspecified: Secondary | ICD-10-CM | POA: Diagnosis not present

## 2015-10-20 DIAGNOSIS — C61 Malignant neoplasm of prostate: Secondary | ICD-10-CM | POA: Diagnosis not present

## 2015-10-20 DIAGNOSIS — G8929 Other chronic pain: Secondary | ICD-10-CM | POA: Diagnosis not present

## 2015-10-21 ENCOUNTER — Ambulatory Visit
Admission: RE | Admit: 2015-10-21 | Discharge: 2015-10-21 | Disposition: A | Discharge status: Home / Self Care | Payer: 59 | Source: Ambulatory Visit | Attending: Radiation Oncology | Admitting: Radiation Oncology

## 2015-10-21 DIAGNOSIS — E039 Hypothyroidism, unspecified: Secondary | ICD-10-CM | POA: Diagnosis not present

## 2015-10-21 DIAGNOSIS — R9721 Rising PSA following treatment for malignant neoplasm of prostate: Secondary | ICD-10-CM | POA: Diagnosis not present

## 2015-10-21 DIAGNOSIS — M545 Low back pain: Secondary | ICD-10-CM | POA: Diagnosis not present

## 2015-10-21 DIAGNOSIS — Z510 Encounter for antineoplastic radiation therapy: Secondary | ICD-10-CM | POA: Diagnosis not present

## 2015-10-21 DIAGNOSIS — C61 Malignant neoplasm of prostate: Secondary | ICD-10-CM | POA: Diagnosis not present

## 2015-10-21 DIAGNOSIS — G8929 Other chronic pain: Secondary | ICD-10-CM | POA: Diagnosis not present

## 2015-10-21 DIAGNOSIS — Z87891 Personal history of nicotine dependence: Secondary | ICD-10-CM | POA: Diagnosis not present

## 2015-10-21 DIAGNOSIS — G709 Myoneural disorder, unspecified: Secondary | ICD-10-CM | POA: Diagnosis not present

## 2015-10-21 DIAGNOSIS — I10 Essential (primary) hypertension: Secondary | ICD-10-CM | POA: Diagnosis not present

## 2015-10-21 NOTE — Progress Notes (Signed)
Oriented patient to staff and routine of the clinic. Provided patient with RADIATION THERAPY AND YOU handbook then, reviewed pertinent information. Educated patient reference potential side effects and management such as fatigue, diarrhea, and urinary bladder changes. Answered all patient questions to the best of my ability. Provided patient with my business card and encouraged him to call with needs. Patient verbalized understanding of all reviewed.

## 2015-10-22 ENCOUNTER — Ambulatory Visit
Admission: RE | Admit: 2015-10-22 | Discharge: 2015-10-22 | Disposition: A | Discharge status: Home / Self Care | Payer: 59 | Source: Ambulatory Visit | Attending: Radiation Oncology | Admitting: Radiation Oncology

## 2015-10-22 VITALS — BP 115/61 | HR 51 | Resp 16 | Wt 209.0 lb

## 2015-10-22 DIAGNOSIS — R9721 Rising PSA following treatment for malignant neoplasm of prostate: Secondary | ICD-10-CM | POA: Diagnosis not present

## 2015-10-22 DIAGNOSIS — Z510 Encounter for antineoplastic radiation therapy: Secondary | ICD-10-CM | POA: Diagnosis not present

## 2015-10-22 DIAGNOSIS — G8929 Other chronic pain: Secondary | ICD-10-CM | POA: Diagnosis not present

## 2015-10-22 DIAGNOSIS — C61 Malignant neoplasm of prostate: Secondary | ICD-10-CM

## 2015-10-22 DIAGNOSIS — Z87891 Personal history of nicotine dependence: Secondary | ICD-10-CM | POA: Diagnosis not present

## 2015-10-22 DIAGNOSIS — E039 Hypothyroidism, unspecified: Secondary | ICD-10-CM | POA: Diagnosis not present

## 2015-10-22 DIAGNOSIS — I10 Essential (primary) hypertension: Secondary | ICD-10-CM | POA: Diagnosis not present

## 2015-10-22 DIAGNOSIS — G709 Myoneural disorder, unspecified: Secondary | ICD-10-CM | POA: Diagnosis not present

## 2015-10-22 DIAGNOSIS — M545 Low back pain: Secondary | ICD-10-CM | POA: Diagnosis not present

## 2015-10-22 NOTE — Progress Notes (Signed)
  Radiation Oncology         [phone]   Name: Roger Durham MRN: [id number]   Date: 10/22/2015  DOB: [birth year]     Weekly Radiation Therapy Management    ICD-9-CM ICD-10-CM   1. Prostate cancer ([place]) 185 C61     Current Dose: 5.4 Gy  Planned Dose:  68.4 Gy  Narrative The patient presents for routine under treatment assessment. Weight and vitals stable. Denies pain. Reports urinary frequency worse at night, feelings of intermittent urinary urgency, and nocturia x 1-2. Denies dysuria and hematuria. Describes a strong steady urine stream, denies difficulty emptying his bladder, denies incontinence or leakage. Denies fatigue.  .  The patient is without complaint. Set-up films were reviewed. The chart was checked.  Physical Findings  weight is 209 lb (94.802 kg). His blood pressure is 115/61 and his pulse is 51. His respiration is 16 and oxygen saturation is 100%. . Weight essentially stable.  No significant changes.  Impression The patient is tolerating radiation.  Plan Continue treatment as planned.     Sheral Apley Tammi Klippel, M.D.   This document serves as a record of services personally performed by Tyler Pita, MD. It was created on his behalf by Derek Mound, a trained medical scribe. The creation of this record is based on the scribe's personal observations and the provider's statements to them. This document has been checked and approved by the attending provider.

## 2015-10-22 NOTE — Progress Notes (Signed)
Weight and vitals stable. Denies pain. Reports urinary frequency worse at night, feelings of intermittent urinary urgency, and nocturia x 1-2. Denies dysuria and hematuria. Describes a strong steady urine stream, denies difficulty emptying his bladder, denies incontinence or leakage. Denies fatigue.   BP 115/61 mmHg  Pulse 51  Resp 16  Wt 209 lb (94.802 kg)  SpO2 100% Wt Readings from Last 3 Encounters:  10/22/15 209 lb (94.802 kg)  09/30/15 206 lb (93.441 kg)  11/21/12 205 lb (92.987 kg)

## 2015-10-23 ENCOUNTER — Ambulatory Visit
Admission: RE | Admit: 2015-10-23 | Discharge: 2015-10-23 | Disposition: A | Discharge status: Home / Self Care | Payer: 59 | Source: Ambulatory Visit | Attending: Radiation Oncology | Admitting: Radiation Oncology

## 2015-10-23 DIAGNOSIS — C61 Malignant neoplasm of prostate: Secondary | ICD-10-CM | POA: Diagnosis not present

## 2015-10-23 DIAGNOSIS — G709 Myoneural disorder, unspecified: Secondary | ICD-10-CM | POA: Diagnosis not present

## 2015-10-23 DIAGNOSIS — Z510 Encounter for antineoplastic radiation therapy: Secondary | ICD-10-CM | POA: Diagnosis not present

## 2015-10-23 DIAGNOSIS — E039 Hypothyroidism, unspecified: Secondary | ICD-10-CM | POA: Diagnosis not present

## 2015-10-23 DIAGNOSIS — G8929 Other chronic pain: Secondary | ICD-10-CM | POA: Diagnosis not present

## 2015-10-23 DIAGNOSIS — I10 Essential (primary) hypertension: Secondary | ICD-10-CM | POA: Diagnosis not present

## 2015-10-23 DIAGNOSIS — R9721 Rising PSA following treatment for malignant neoplasm of prostate: Secondary | ICD-10-CM | POA: Diagnosis not present

## 2015-10-23 DIAGNOSIS — M545 Low back pain: Secondary | ICD-10-CM | POA: Diagnosis not present

## 2015-10-23 DIAGNOSIS — Z87891 Personal history of nicotine dependence: Secondary | ICD-10-CM | POA: Diagnosis not present

## 2015-10-26 ENCOUNTER — Ambulatory Visit
Admission: RE | Admit: 2015-10-26 | Discharge: 2015-10-26 | Disposition: A | Discharge status: Home / Self Care | Payer: 59 | Source: Ambulatory Visit | Attending: Radiation Oncology | Admitting: Radiation Oncology

## 2015-10-26 DIAGNOSIS — G709 Myoneural disorder, unspecified: Secondary | ICD-10-CM | POA: Diagnosis not present

## 2015-10-26 DIAGNOSIS — M545 Low back pain: Secondary | ICD-10-CM | POA: Diagnosis not present

## 2015-10-26 DIAGNOSIS — Z510 Encounter for antineoplastic radiation therapy: Secondary | ICD-10-CM | POA: Diagnosis not present

## 2015-10-26 DIAGNOSIS — I10 Essential (primary) hypertension: Secondary | ICD-10-CM | POA: Diagnosis not present

## 2015-10-26 DIAGNOSIS — R9721 Rising PSA following treatment for malignant neoplasm of prostate: Secondary | ICD-10-CM | POA: Diagnosis not present

## 2015-10-26 DIAGNOSIS — G8929 Other chronic pain: Secondary | ICD-10-CM | POA: Diagnosis not present

## 2015-10-26 DIAGNOSIS — E039 Hypothyroidism, unspecified: Secondary | ICD-10-CM | POA: Diagnosis not present

## 2015-10-26 DIAGNOSIS — Z87891 Personal history of nicotine dependence: Secondary | ICD-10-CM | POA: Diagnosis not present

## 2015-10-26 DIAGNOSIS — C61 Malignant neoplasm of prostate: Secondary | ICD-10-CM | POA: Diagnosis not present

## 2015-10-27 ENCOUNTER — Ambulatory Visit
Admission: RE | Admit: 2015-10-27 | Discharge: 2015-10-27 | Disposition: A | Discharge status: Home / Self Care | Payer: 59 | Source: Ambulatory Visit | Attending: Radiation Oncology | Admitting: Radiation Oncology

## 2015-10-27 DIAGNOSIS — R9721 Rising PSA following treatment for malignant neoplasm of prostate: Secondary | ICD-10-CM | POA: Diagnosis not present

## 2015-10-27 DIAGNOSIS — G8929 Other chronic pain: Secondary | ICD-10-CM | POA: Diagnosis not present

## 2015-10-27 DIAGNOSIS — E039 Hypothyroidism, unspecified: Secondary | ICD-10-CM | POA: Diagnosis not present

## 2015-10-27 DIAGNOSIS — Z87891 Personal history of nicotine dependence: Secondary | ICD-10-CM | POA: Diagnosis not present

## 2015-10-27 DIAGNOSIS — G709 Myoneural disorder, unspecified: Secondary | ICD-10-CM | POA: Diagnosis not present

## 2015-10-27 DIAGNOSIS — I10 Essential (primary) hypertension: Secondary | ICD-10-CM | POA: Diagnosis not present

## 2015-10-27 DIAGNOSIS — C61 Malignant neoplasm of prostate: Secondary | ICD-10-CM | POA: Diagnosis not present

## 2015-10-27 DIAGNOSIS — M545 Low back pain: Secondary | ICD-10-CM | POA: Diagnosis not present

## 2015-10-27 DIAGNOSIS — Z510 Encounter for antineoplastic radiation therapy: Secondary | ICD-10-CM | POA: Diagnosis not present

## 2015-10-28 ENCOUNTER — Ambulatory Visit
Admission: RE | Admit: 2015-10-28 | Discharge: 2015-10-28 | Disposition: A | Discharge status: Home / Self Care | Payer: 59 | Source: Ambulatory Visit | Attending: Radiation Oncology | Admitting: Radiation Oncology

## 2015-10-28 DIAGNOSIS — G8929 Other chronic pain: Secondary | ICD-10-CM | POA: Diagnosis not present

## 2015-10-28 DIAGNOSIS — E039 Hypothyroidism, unspecified: Secondary | ICD-10-CM | POA: Diagnosis not present

## 2015-10-28 DIAGNOSIS — Z87891 Personal history of nicotine dependence: Secondary | ICD-10-CM | POA: Diagnosis not present

## 2015-10-28 DIAGNOSIS — Z510 Encounter for antineoplastic radiation therapy: Secondary | ICD-10-CM | POA: Diagnosis not present

## 2015-10-28 DIAGNOSIS — C61 Malignant neoplasm of prostate: Secondary | ICD-10-CM | POA: Diagnosis not present

## 2015-10-28 DIAGNOSIS — M545 Low back pain: Secondary | ICD-10-CM | POA: Diagnosis not present

## 2015-10-28 DIAGNOSIS — R9721 Rising PSA following treatment for malignant neoplasm of prostate: Secondary | ICD-10-CM | POA: Diagnosis not present

## 2015-10-28 DIAGNOSIS — G709 Myoneural disorder, unspecified: Secondary | ICD-10-CM | POA: Diagnosis not present

## 2015-10-28 DIAGNOSIS — I10 Essential (primary) hypertension: Secondary | ICD-10-CM | POA: Diagnosis not present

## 2015-10-29 ENCOUNTER — Ambulatory Visit
Admission: RE | Admit: 2015-10-29 | Discharge: 2015-10-29 | Disposition: A | Discharge status: Home / Self Care | Payer: 59 | Source: Ambulatory Visit | Attending: Radiation Oncology | Admitting: Radiation Oncology

## 2015-10-29 DIAGNOSIS — G8929 Other chronic pain: Secondary | ICD-10-CM | POA: Diagnosis not present

## 2015-10-29 DIAGNOSIS — Z87891 Personal history of nicotine dependence: Secondary | ICD-10-CM | POA: Diagnosis not present

## 2015-10-29 DIAGNOSIS — I10 Essential (primary) hypertension: Secondary | ICD-10-CM | POA: Diagnosis not present

## 2015-10-29 DIAGNOSIS — Z510 Encounter for antineoplastic radiation therapy: Secondary | ICD-10-CM | POA: Diagnosis not present

## 2015-10-29 DIAGNOSIS — G709 Myoneural disorder, unspecified: Secondary | ICD-10-CM | POA: Diagnosis not present

## 2015-10-29 DIAGNOSIS — E039 Hypothyroidism, unspecified: Secondary | ICD-10-CM | POA: Diagnosis not present

## 2015-10-29 DIAGNOSIS — M545 Low back pain: Secondary | ICD-10-CM | POA: Diagnosis not present

## 2015-10-29 DIAGNOSIS — R9721 Rising PSA following treatment for malignant neoplasm of prostate: Secondary | ICD-10-CM | POA: Diagnosis not present

## 2015-10-29 DIAGNOSIS — C61 Malignant neoplasm of prostate: Secondary | ICD-10-CM | POA: Diagnosis not present

## 2015-10-30 ENCOUNTER — Ambulatory Visit
Admission: RE | Admit: 2015-10-30 | Discharge: 2015-10-30 | Disposition: A | Discharge status: Home / Self Care | Payer: 59 | Source: Ambulatory Visit | Attending: Radiation Oncology | Admitting: Radiation Oncology

## 2015-10-30 ENCOUNTER — Encounter: Payer: Self-pay | Admitting: Radiation Oncology

## 2015-10-30 VITALS — BP 114/68 | HR 63 | Resp 16 | Wt 204.8 lb

## 2015-10-30 DIAGNOSIS — Z87891 Personal history of nicotine dependence: Secondary | ICD-10-CM | POA: Diagnosis not present

## 2015-10-30 DIAGNOSIS — M545 Low back pain: Secondary | ICD-10-CM | POA: Diagnosis not present

## 2015-10-30 DIAGNOSIS — C61 Malignant neoplasm of prostate: Secondary | ICD-10-CM

## 2015-10-30 DIAGNOSIS — Z510 Encounter for antineoplastic radiation therapy: Secondary | ICD-10-CM | POA: Diagnosis not present

## 2015-10-30 DIAGNOSIS — G709 Myoneural disorder, unspecified: Secondary | ICD-10-CM | POA: Diagnosis not present

## 2015-10-30 DIAGNOSIS — I10 Essential (primary) hypertension: Secondary | ICD-10-CM | POA: Diagnosis not present

## 2015-10-30 DIAGNOSIS — G8929 Other chronic pain: Secondary | ICD-10-CM | POA: Diagnosis not present

## 2015-10-30 DIAGNOSIS — R9721 Rising PSA following treatment for malignant neoplasm of prostate: Secondary | ICD-10-CM | POA: Diagnosis not present

## 2015-10-30 DIAGNOSIS — E039 Hypothyroidism, unspecified: Secondary | ICD-10-CM | POA: Diagnosis not present

## 2015-10-30 NOTE — Progress Notes (Signed)
Weight and vitals stable. Denies pain. Reports urinary frequency is worse at night. Reports feelings of intermittent urinary urgency. Reports nocturia x 1-2. Denies hematuria, dysuria, leakage, incontinence or difficulty emptying his bladder. Denies fatigue.   BP 114/68 mmHg  Pulse 63  Resp 16  Wt 204 lb 12.8 oz (92.897 kg)  SpO2 100% Wt Readings from Last 3 Encounters:  10/30/15 204 lb 12.8 oz (92.897 kg)  10/22/15 209 lb (94.802 kg)  09/30/15 206 lb (93.441 kg)

## 2015-10-30 NOTE — Progress Notes (Signed)
  Radiation Oncology         [phone]   Name: Roger Durham MRN: [id number]   Date: 10/30/2015  DOB: 09/11/1956     Weekly Radiation Therapy Management    ICD-9-CM ICD-10-CM   1. Prostate cancer ([place]) 185 C61     Current Dose: 16.2 Gy  Planned Dose:  68.4 Gy  Narrative The patient presents for routine under treatment assessment. Weight and vitals stable. Denies pain. Reports urinary frequency is worse at night. Reports feelings of intermittent urinary urgency. Reports nocturia x 1-2. Denies hematuria, dysuria, leakage, incontinence or difficulty emptying his bladder. Denies fatigue.   .  The patient is without complaint. Set-up films were reviewed. The chart was checked.  Physical Findings  weight is 204 lb 12.8 oz (92.897 kg). His blood pressure is 114/68 and his pulse is 63. His respiration is 16 and oxygen saturation is 100%. . Weight essentially stable.  No significant changes.  Impression The patient is tolerating radiation.  Plan Continue treatment as planned.     Sheral Apley Tammi Klippel, M.D.   This document serves as a record of services personally performed by Tyler Pita, MD. It was created on his behalf by Derek Mound, a trained medical scribe. The creation of this record is based on the scribe's personal observations and the provider's statements to them. This document has been checked and approved by the attending provider.

## 2015-11-02 ENCOUNTER — Ambulatory Visit
Admission: RE | Admit: 2015-11-02 | Discharge: 2015-11-02 | Disposition: A | Discharge status: Home / Self Care | Payer: 59 | Source: Ambulatory Visit | Attending: Radiation Oncology | Admitting: Radiation Oncology

## 2015-11-02 DIAGNOSIS — M545 Low back pain: Secondary | ICD-10-CM | POA: Diagnosis not present

## 2015-11-02 DIAGNOSIS — G8929 Other chronic pain: Secondary | ICD-10-CM | POA: Diagnosis not present

## 2015-11-02 DIAGNOSIS — C61 Malignant neoplasm of prostate: Secondary | ICD-10-CM | POA: Diagnosis not present

## 2015-11-02 DIAGNOSIS — Z510 Encounter for antineoplastic radiation therapy: Secondary | ICD-10-CM | POA: Diagnosis not present

## 2015-11-02 DIAGNOSIS — R9721 Rising PSA following treatment for malignant neoplasm of prostate: Secondary | ICD-10-CM | POA: Diagnosis not present

## 2015-11-02 DIAGNOSIS — G709 Myoneural disorder, unspecified: Secondary | ICD-10-CM | POA: Diagnosis not present

## 2015-11-02 DIAGNOSIS — E039 Hypothyroidism, unspecified: Secondary | ICD-10-CM | POA: Diagnosis not present

## 2015-11-02 DIAGNOSIS — Z87891 Personal history of nicotine dependence: Secondary | ICD-10-CM | POA: Diagnosis not present

## 2015-11-02 DIAGNOSIS — I10 Essential (primary) hypertension: Secondary | ICD-10-CM | POA: Diagnosis not present

## 2015-11-03 ENCOUNTER — Ambulatory Visit
Admission: RE | Admit: 2015-11-03 | Discharge: 2015-11-03 | Disposition: A | Discharge status: Home / Self Care | Payer: 59 | Source: Ambulatory Visit | Attending: Radiation Oncology | Admitting: Radiation Oncology

## 2015-11-03 DIAGNOSIS — I10 Essential (primary) hypertension: Secondary | ICD-10-CM | POA: Diagnosis not present

## 2015-11-03 DIAGNOSIS — R9721 Rising PSA following treatment for malignant neoplasm of prostate: Secondary | ICD-10-CM | POA: Diagnosis not present

## 2015-11-03 DIAGNOSIS — C61 Malignant neoplasm of prostate: Secondary | ICD-10-CM | POA: Diagnosis not present

## 2015-11-03 DIAGNOSIS — G709 Myoneural disorder, unspecified: Secondary | ICD-10-CM | POA: Diagnosis not present

## 2015-11-03 DIAGNOSIS — E039 Hypothyroidism, unspecified: Secondary | ICD-10-CM | POA: Diagnosis not present

## 2015-11-03 DIAGNOSIS — Z510 Encounter for antineoplastic radiation therapy: Secondary | ICD-10-CM | POA: Diagnosis not present

## 2015-11-03 DIAGNOSIS — G8929 Other chronic pain: Secondary | ICD-10-CM | POA: Diagnosis not present

## 2015-11-03 DIAGNOSIS — M545 Low back pain: Secondary | ICD-10-CM | POA: Diagnosis not present

## 2015-11-03 DIAGNOSIS — Z87891 Personal history of nicotine dependence: Secondary | ICD-10-CM | POA: Diagnosis not present

## 2015-11-04 ENCOUNTER — Ambulatory Visit
Admission: RE | Admit: 2015-11-04 | Discharge: 2015-11-04 | Disposition: A | Discharge status: Home / Self Care | Payer: 59 | Source: Ambulatory Visit | Attending: Radiation Oncology | Admitting: Radiation Oncology

## 2015-11-04 DIAGNOSIS — Z510 Encounter for antineoplastic radiation therapy: Secondary | ICD-10-CM | POA: Diagnosis not present

## 2015-11-04 DIAGNOSIS — Z87891 Personal history of nicotine dependence: Secondary | ICD-10-CM | POA: Diagnosis not present

## 2015-11-04 DIAGNOSIS — C61 Malignant neoplasm of prostate: Secondary | ICD-10-CM | POA: Diagnosis not present

## 2015-11-04 DIAGNOSIS — G8929 Other chronic pain: Secondary | ICD-10-CM | POA: Diagnosis not present

## 2015-11-04 DIAGNOSIS — R9721 Rising PSA following treatment for malignant neoplasm of prostate: Secondary | ICD-10-CM | POA: Diagnosis not present

## 2015-11-04 DIAGNOSIS — G709 Myoneural disorder, unspecified: Secondary | ICD-10-CM | POA: Diagnosis not present

## 2015-11-04 DIAGNOSIS — M545 Low back pain: Secondary | ICD-10-CM | POA: Diagnosis not present

## 2015-11-04 DIAGNOSIS — I10 Essential (primary) hypertension: Secondary | ICD-10-CM | POA: Diagnosis not present

## 2015-11-04 DIAGNOSIS — E039 Hypothyroidism, unspecified: Secondary | ICD-10-CM | POA: Diagnosis not present

## 2015-11-05 ENCOUNTER — Ambulatory Visit
Admission: RE | Admit: 2015-11-05 | Discharge: 2015-11-05 | Disposition: A | Discharge status: Home / Self Care | Payer: 59 | Source: Ambulatory Visit | Attending: Radiation Oncology | Admitting: Radiation Oncology

## 2015-11-05 DIAGNOSIS — C61 Malignant neoplasm of prostate: Secondary | ICD-10-CM | POA: Diagnosis not present

## 2015-11-05 DIAGNOSIS — R9721 Rising PSA following treatment for malignant neoplasm of prostate: Secondary | ICD-10-CM | POA: Diagnosis not present

## 2015-11-05 DIAGNOSIS — Z510 Encounter for antineoplastic radiation therapy: Secondary | ICD-10-CM | POA: Diagnosis not present

## 2015-11-05 DIAGNOSIS — M545 Low back pain: Secondary | ICD-10-CM | POA: Diagnosis not present

## 2015-11-05 DIAGNOSIS — E039 Hypothyroidism, unspecified: Secondary | ICD-10-CM | POA: Diagnosis not present

## 2015-11-05 DIAGNOSIS — G709 Myoneural disorder, unspecified: Secondary | ICD-10-CM | POA: Diagnosis not present

## 2015-11-05 DIAGNOSIS — Z87891 Personal history of nicotine dependence: Secondary | ICD-10-CM | POA: Diagnosis not present

## 2015-11-05 DIAGNOSIS — G8929 Other chronic pain: Secondary | ICD-10-CM | POA: Diagnosis not present

## 2015-11-05 DIAGNOSIS — I10 Essential (primary) hypertension: Secondary | ICD-10-CM | POA: Diagnosis not present

## 2015-11-06 ENCOUNTER — Ambulatory Visit
Admission: RE | Admit: 2015-11-06 | Discharge: 2015-11-06 | Disposition: A | Discharge status: Home / Self Care | Payer: 59 | Source: Ambulatory Visit | Attending: Radiation Oncology | Admitting: Radiation Oncology

## 2015-11-06 VITALS — BP 120/65 | HR 73 | Resp 16 | Wt 204.6 lb

## 2015-11-06 DIAGNOSIS — R9721 Rising PSA following treatment for malignant neoplasm of prostate: Secondary | ICD-10-CM | POA: Diagnosis not present

## 2015-11-06 DIAGNOSIS — G8929 Other chronic pain: Secondary | ICD-10-CM | POA: Diagnosis not present

## 2015-11-06 DIAGNOSIS — C61 Malignant neoplasm of prostate: Secondary | ICD-10-CM | POA: Diagnosis not present

## 2015-11-06 DIAGNOSIS — Z87891 Personal history of nicotine dependence: Secondary | ICD-10-CM | POA: Diagnosis not present

## 2015-11-06 DIAGNOSIS — G709 Myoneural disorder, unspecified: Secondary | ICD-10-CM | POA: Diagnosis not present

## 2015-11-06 DIAGNOSIS — I10 Essential (primary) hypertension: Secondary | ICD-10-CM | POA: Diagnosis not present

## 2015-11-06 DIAGNOSIS — Z510 Encounter for antineoplastic radiation therapy: Secondary | ICD-10-CM | POA: Diagnosis not present

## 2015-11-06 DIAGNOSIS — E039 Hypothyroidism, unspecified: Secondary | ICD-10-CM | POA: Diagnosis not present

## 2015-11-06 DIAGNOSIS — M545 Low back pain: Secondary | ICD-10-CM | POA: Diagnosis not present

## 2015-11-06 MED ORDER — PHENAZOPYRIDINE HCL 100 MG PO TABS: 100.0000 mg | ORAL_TABLET | Freq: Three times a day (TID) | ORAL | Status: DC | PRN

## 2015-11-06 MED FILL — PHENAZOPYRIDINE 100 MG TAB: 100 | 30 days supply | Qty: 90 | Fill #0

## 2015-11-06 MED FILL — METHADONE HCL 10 MG TABLET: 10 | 30 days supply | Qty: 90 | Fill #0

## 2015-11-06 MED FILL — LEVOTHYROXINE 88 MCG TABLET: 88 | 30 days supply | Qty: 30 | Fill #3

## 2015-11-06 MED FILL — SIMVASTATIN 40 MG TABLET: 40 | 90 days supply | Qty: 90 | Fill #3

## 2015-11-06 NOTE — Progress Notes (Signed)
  Radiation Oncology         [phone]   Name: Roger Durham MRN: [id number]   Date: 11/06/2015  DOB: 1956/06/21     Weekly Radiation Therapy Management    ICD-9-CM ICD-10-CM   1. Prostate cancer ([place]) 185 C61     Current Dose: 25.2 Gy  Planned Dose:  68.4 Gy  Narrative The patient presents for routine under treatment assessment.  Weight and vitals stable. Denies pain. Reports urinary frequency is worse at night. Reports feelings of intermittent urinary urgency. Reports nocturia x 4-5, but he is able to get back to sleep and feels well rested in the morning. He denies flank pain, hematuria, fevers, chills, incontinence, or difficulty with incomplete emptying. He has noticed increasing dysuria and describes this as a sharp shooting pain in the urethra at the onset of urinary stream.  The patient is without complaint. Set-up films were reviewed. The chart was checked.  Physical Findings  weight is 204 lb 9.6 oz (92.806 kg). His blood pressure is 120/65 and his pulse is 73. His respiration is 16 and oxygen saturation is 100%.   Pain scale  0/10 In general this is a well appearing middle [place] male in no acute distress. He's alert and oriented x4 and appropriate throughout the exam. Cardiopulmonary assessment is negative for distress and he exhibits normal effort.  Impression The patient is tolerating radiation.  Plan Continue treatment as planned. We have written the patient a prescription for Pyridium to manage new onset dysuria at 100mg  po TID, prn #60, no refills.     Sheral Apley Tammi Klippel, M.D.   This document serves as a record of services personally performed by Tyler Pita, MD. It was created on his behalf by Arlyce Harman, a trained medical scribe. The creation of this record is based on the scribe's personal observations and the provider's statements to them. This document has been checked and approved by the attending provider.

## 2015-11-06 NOTE — Progress Notes (Signed)
Weight and vitals stable. Denies pain. Reports urinary frequency is worse at night. Reports feelings of intermittent urinary urgency. Reports nocturia x 4-5. Denies hematuria, leakage, incontinence or difficulty emptying his bladder. Reports dysuria of new onset. Denies fatigue.   BP 120/65 mmHg  Pulse 73  Resp 16  Wt 204 lb 9.6 oz (92.806 kg)  SpO2 100% Wt Readings from Last 3 Encounters:  11/06/15 204 lb 9.6 oz (92.806 kg)  10/30/15 204 lb 12.8 oz (92.897 kg)  10/22/15 209 lb (94.802 kg)

## 2015-11-09 ENCOUNTER — Ambulatory Visit
Admission: RE | Admit: 2015-11-09 | Discharge: 2015-11-09 | Disposition: A | Discharge status: Home / Self Care | Payer: 59 | Source: Ambulatory Visit | Attending: Radiation Oncology | Admitting: Radiation Oncology

## 2015-11-09 DIAGNOSIS — C61 Malignant neoplasm of prostate: Secondary | ICD-10-CM | POA: Diagnosis not present

## 2015-11-09 DIAGNOSIS — M545 Low back pain: Secondary | ICD-10-CM | POA: Diagnosis not present

## 2015-11-09 DIAGNOSIS — E039 Hypothyroidism, unspecified: Secondary | ICD-10-CM | POA: Diagnosis not present

## 2015-11-09 DIAGNOSIS — R9721 Rising PSA following treatment for malignant neoplasm of prostate: Secondary | ICD-10-CM | POA: Diagnosis not present

## 2015-11-09 DIAGNOSIS — Z87891 Personal history of nicotine dependence: Secondary | ICD-10-CM | POA: Diagnosis not present

## 2015-11-09 DIAGNOSIS — Z510 Encounter for antineoplastic radiation therapy: Secondary | ICD-10-CM | POA: Diagnosis not present

## 2015-11-09 DIAGNOSIS — G8929 Other chronic pain: Secondary | ICD-10-CM | POA: Diagnosis not present

## 2015-11-09 DIAGNOSIS — G709 Myoneural disorder, unspecified: Secondary | ICD-10-CM | POA: Diagnosis not present

## 2015-11-09 DIAGNOSIS — I10 Essential (primary) hypertension: Secondary | ICD-10-CM | POA: Diagnosis not present

## 2015-11-10 ENCOUNTER — Ambulatory Visit
Admission: RE | Admit: 2015-11-10 | Discharge: 2015-11-10 | Disposition: A | Discharge status: Home / Self Care | Payer: 59 | Source: Ambulatory Visit | Attending: Radiation Oncology | Admitting: Radiation Oncology

## 2015-11-10 DIAGNOSIS — G8929 Other chronic pain: Secondary | ICD-10-CM | POA: Diagnosis not present

## 2015-11-10 DIAGNOSIS — G709 Myoneural disorder, unspecified: Secondary | ICD-10-CM | POA: Diagnosis not present

## 2015-11-10 DIAGNOSIS — Z87891 Personal history of nicotine dependence: Secondary | ICD-10-CM | POA: Diagnosis not present

## 2015-11-10 DIAGNOSIS — I10 Essential (primary) hypertension: Secondary | ICD-10-CM | POA: Diagnosis not present

## 2015-11-10 DIAGNOSIS — M545 Low back pain: Secondary | ICD-10-CM | POA: Diagnosis not present

## 2015-11-10 DIAGNOSIS — R9721 Rising PSA following treatment for malignant neoplasm of prostate: Secondary | ICD-10-CM | POA: Diagnosis not present

## 2015-11-10 DIAGNOSIS — E039 Hypothyroidism, unspecified: Secondary | ICD-10-CM | POA: Diagnosis not present

## 2015-11-10 DIAGNOSIS — C61 Malignant neoplasm of prostate: Secondary | ICD-10-CM | POA: Diagnosis not present

## 2015-11-10 DIAGNOSIS — Z510 Encounter for antineoplastic radiation therapy: Secondary | ICD-10-CM | POA: Diagnosis not present

## 2015-11-11 ENCOUNTER — Ambulatory Visit
Admission: RE | Admit: 2015-11-11 | Discharge: 2015-11-11 | Disposition: A | Discharge status: Home / Self Care | Payer: 59 | Source: Ambulatory Visit | Attending: Radiation Oncology | Admitting: Radiation Oncology

## 2015-11-11 DIAGNOSIS — R9721 Rising PSA following treatment for malignant neoplasm of prostate: Secondary | ICD-10-CM | POA: Diagnosis not present

## 2015-11-11 DIAGNOSIS — I10 Essential (primary) hypertension: Secondary | ICD-10-CM | POA: Diagnosis not present

## 2015-11-11 DIAGNOSIS — M545 Low back pain: Secondary | ICD-10-CM | POA: Diagnosis not present

## 2015-11-11 DIAGNOSIS — G709 Myoneural disorder, unspecified: Secondary | ICD-10-CM | POA: Diagnosis not present

## 2015-11-11 DIAGNOSIS — Z87891 Personal history of nicotine dependence: Secondary | ICD-10-CM | POA: Diagnosis not present

## 2015-11-11 DIAGNOSIS — C61 Malignant neoplasm of prostate: Secondary | ICD-10-CM | POA: Diagnosis not present

## 2015-11-11 DIAGNOSIS — G8929 Other chronic pain: Secondary | ICD-10-CM | POA: Diagnosis not present

## 2015-11-11 DIAGNOSIS — Z510 Encounter for antineoplastic radiation therapy: Secondary | ICD-10-CM | POA: Diagnosis not present

## 2015-11-11 DIAGNOSIS — E039 Hypothyroidism, unspecified: Secondary | ICD-10-CM | POA: Diagnosis not present

## 2015-11-12 ENCOUNTER — Ambulatory Visit
Admission: RE | Admit: 2015-11-12 | Discharge: 2015-11-12 | Disposition: A | Discharge status: Home / Self Care | Payer: 59 | Source: Ambulatory Visit | Attending: Radiation Oncology | Admitting: Radiation Oncology

## 2015-11-12 VITALS — BP 127/63 | HR 70 | Resp 16 | Wt 206.3 lb

## 2015-11-12 DIAGNOSIS — E039 Hypothyroidism, unspecified: Secondary | ICD-10-CM | POA: Diagnosis not present

## 2015-11-12 DIAGNOSIS — R9721 Rising PSA following treatment for malignant neoplasm of prostate: Secondary | ICD-10-CM | POA: Diagnosis not present

## 2015-11-12 DIAGNOSIS — C61 Malignant neoplasm of prostate: Secondary | ICD-10-CM | POA: Diagnosis not present

## 2015-11-12 DIAGNOSIS — Z87891 Personal history of nicotine dependence: Secondary | ICD-10-CM | POA: Diagnosis not present

## 2015-11-12 DIAGNOSIS — I10 Essential (primary) hypertension: Secondary | ICD-10-CM | POA: Diagnosis not present

## 2015-11-12 DIAGNOSIS — M545 Low back pain: Secondary | ICD-10-CM | POA: Diagnosis not present

## 2015-11-12 DIAGNOSIS — G709 Myoneural disorder, unspecified: Secondary | ICD-10-CM | POA: Diagnosis not present

## 2015-11-12 DIAGNOSIS — Z510 Encounter for antineoplastic radiation therapy: Secondary | ICD-10-CM | POA: Diagnosis not present

## 2015-11-12 DIAGNOSIS — G8929 Other chronic pain: Secondary | ICD-10-CM | POA: Diagnosis not present

## 2015-11-12 NOTE — Progress Notes (Signed)
Weight and vitals stable. Denies pain. Reports feelings of intermittent urgency continues. Reports dysuria continues but, is no worse. Reports nocturia x 4-5. Denies hematuria, leakage, incontinence or difficulty emptying his bladder. Reports mild fatigue.   BP 127/63 mmHg  Pulse 70  Resp 16  Wt 206 lb 4.8 oz (93.577 kg) Wt Readings from Last 3 Encounters:  11/12/15 206 lb 4.8 oz (93.577 kg)  11/06/15 204 lb 9.6 oz (92.806 kg)  10/30/15 204 lb 12.8 oz (92.897 kg)

## 2015-11-12 NOTE — Progress Notes (Signed)
  Radiation Oncology         [phone]   Name: Roger Durham MRN: [id number]   Date: 11/12/2015  DOB: 1956-06-16     Weekly Radiation Therapy Management    ICD-9-CM ICD-10-CM   1. Prostate cancer ([place]) 185 C61     Current Dose: 32.4 Gy  Planned Dose:  68.4 Gy  Narrative The patient presents for routine under treatment assessment.  Weight and vitals stable. Denies pain. Reports feelings of intermittent urgency continues. Reports dysuria continues, but it is not worse. He states the Pyridium is helping. Reports nocturia x 4-5. Denies hematuria, leakage, incontinence, or difficulty emptying his bladder. Reports mild fatigue.  Set-up films were reviewed. The chart was checked.  Physical Findings  weight is 206 lb 4.8 oz (93.577 kg). His blood pressure is 127/63 and his pulse is 70. His respiration is 16.   Pain scale  0/10 In general this is a well appearing middle [place] male in no acute distress. He's alert and oriented x4 and appropriate throughout the exam. Cardiopulmonary assessment is negative for distress and he exhibits normal effort.  Impression The patient is tolerating radiation.  Plan Continue treatment as planned.     Sheral Apley Tammi Klippel, M.D.  This document serves as a record of services personally performed by Tyler Pita, MD. It was created on his behalf by Darcus Austin, a trained medical scribe. The creation of this record is based on the scribe's personal observations and the provider's statements to them. This document has been checked and approved by the attending provider.

## 2015-11-13 ENCOUNTER — Ambulatory Visit
Admission: RE | Admit: 2015-11-13 | Discharge: 2015-11-13 | Disposition: A | Discharge status: Home / Self Care | Payer: 59 | Source: Ambulatory Visit | Attending: Radiation Oncology | Admitting: Radiation Oncology

## 2015-11-13 DIAGNOSIS — M545 Low back pain: Secondary | ICD-10-CM | POA: Diagnosis not present

## 2015-11-13 DIAGNOSIS — E039 Hypothyroidism, unspecified: Secondary | ICD-10-CM | POA: Diagnosis not present

## 2015-11-13 DIAGNOSIS — R9721 Rising PSA following treatment for malignant neoplasm of prostate: Secondary | ICD-10-CM | POA: Diagnosis not present

## 2015-11-13 DIAGNOSIS — I10 Essential (primary) hypertension: Secondary | ICD-10-CM | POA: Diagnosis not present

## 2015-11-13 DIAGNOSIS — C61 Malignant neoplasm of prostate: Secondary | ICD-10-CM | POA: Diagnosis not present

## 2015-11-13 DIAGNOSIS — Z510 Encounter for antineoplastic radiation therapy: Secondary | ICD-10-CM | POA: Diagnosis not present

## 2015-11-13 DIAGNOSIS — Z87891 Personal history of nicotine dependence: Secondary | ICD-10-CM | POA: Diagnosis not present

## 2015-11-13 DIAGNOSIS — G8929 Other chronic pain: Secondary | ICD-10-CM | POA: Diagnosis not present

## 2015-11-13 DIAGNOSIS — G709 Myoneural disorder, unspecified: Secondary | ICD-10-CM | POA: Diagnosis not present

## 2015-11-16 ENCOUNTER — Ambulatory Visit
Admission: RE | Admit: 2015-11-16 | Discharge: 2015-11-16 | Disposition: A | Discharge status: Home / Self Care | Payer: 59 | Source: Ambulatory Visit | Attending: Radiation Oncology | Admitting: Radiation Oncology

## 2015-11-16 DIAGNOSIS — G709 Myoneural disorder, unspecified: Secondary | ICD-10-CM | POA: Diagnosis not present

## 2015-11-16 DIAGNOSIS — E039 Hypothyroidism, unspecified: Secondary | ICD-10-CM | POA: Diagnosis not present

## 2015-11-16 DIAGNOSIS — C61 Malignant neoplasm of prostate: Secondary | ICD-10-CM | POA: Diagnosis not present

## 2015-11-16 DIAGNOSIS — M545 Low back pain: Secondary | ICD-10-CM | POA: Diagnosis not present

## 2015-11-16 DIAGNOSIS — G8929 Other chronic pain: Secondary | ICD-10-CM | POA: Diagnosis not present

## 2015-11-16 DIAGNOSIS — Z510 Encounter for antineoplastic radiation therapy: Secondary | ICD-10-CM | POA: Diagnosis not present

## 2015-11-16 DIAGNOSIS — Z87891 Personal history of nicotine dependence: Secondary | ICD-10-CM | POA: Diagnosis not present

## 2015-11-16 DIAGNOSIS — R9721 Rising PSA following treatment for malignant neoplasm of prostate: Secondary | ICD-10-CM | POA: Diagnosis not present

## 2015-11-16 DIAGNOSIS — I10 Essential (primary) hypertension: Secondary | ICD-10-CM | POA: Diagnosis not present

## 2015-11-16 MED FILL — QVAR 80 MCG ORAL INHALER: 80 | 30 days supply | Qty: 9 | Fill #4

## 2015-11-17 ENCOUNTER — Ambulatory Visit
Admission: RE | Admit: 2015-11-17 | Discharge: 2015-11-17 | Disposition: A | Discharge status: Home / Self Care | Payer: 59 | Source: Ambulatory Visit | Attending: Radiation Oncology | Admitting: Radiation Oncology

## 2015-11-17 DIAGNOSIS — M545 Low back pain: Secondary | ICD-10-CM | POA: Diagnosis not present

## 2015-11-17 DIAGNOSIS — Z87891 Personal history of nicotine dependence: Secondary | ICD-10-CM | POA: Diagnosis not present

## 2015-11-17 DIAGNOSIS — E039 Hypothyroidism, unspecified: Secondary | ICD-10-CM | POA: Diagnosis not present

## 2015-11-17 DIAGNOSIS — C61 Malignant neoplasm of prostate: Secondary | ICD-10-CM | POA: Diagnosis not present

## 2015-11-17 DIAGNOSIS — G8929 Other chronic pain: Secondary | ICD-10-CM | POA: Diagnosis not present

## 2015-11-17 DIAGNOSIS — G709 Myoneural disorder, unspecified: Secondary | ICD-10-CM | POA: Diagnosis not present

## 2015-11-17 DIAGNOSIS — R9721 Rising PSA following treatment for malignant neoplasm of prostate: Secondary | ICD-10-CM | POA: Diagnosis not present

## 2015-11-17 DIAGNOSIS — Z510 Encounter for antineoplastic radiation therapy: Secondary | ICD-10-CM | POA: Diagnosis not present

## 2015-11-17 DIAGNOSIS — I10 Essential (primary) hypertension: Secondary | ICD-10-CM | POA: Diagnosis not present

## 2015-11-18 ENCOUNTER — Ambulatory Visit
Admission: RE | Admit: 2015-11-18 | Discharge: 2015-11-18 | Disposition: A | Discharge status: Home / Self Care | Payer: 59 | Source: Ambulatory Visit | Attending: Radiation Oncology | Admitting: Radiation Oncology

## 2015-11-18 DIAGNOSIS — Z510 Encounter for antineoplastic radiation therapy: Secondary | ICD-10-CM | POA: Diagnosis not present

## 2015-11-18 DIAGNOSIS — E039 Hypothyroidism, unspecified: Secondary | ICD-10-CM | POA: Diagnosis not present

## 2015-11-18 DIAGNOSIS — G709 Myoneural disorder, unspecified: Secondary | ICD-10-CM | POA: Diagnosis not present

## 2015-11-18 DIAGNOSIS — M545 Low back pain: Secondary | ICD-10-CM | POA: Diagnosis not present

## 2015-11-18 DIAGNOSIS — R9721 Rising PSA following treatment for malignant neoplasm of prostate: Secondary | ICD-10-CM | POA: Diagnosis not present

## 2015-11-18 DIAGNOSIS — Z87891 Personal history of nicotine dependence: Secondary | ICD-10-CM | POA: Diagnosis not present

## 2015-11-18 DIAGNOSIS — G8929 Other chronic pain: Secondary | ICD-10-CM | POA: Diagnosis not present

## 2015-11-18 DIAGNOSIS — I10 Essential (primary) hypertension: Secondary | ICD-10-CM | POA: Diagnosis not present

## 2015-11-18 DIAGNOSIS — C61 Malignant neoplasm of prostate: Secondary | ICD-10-CM | POA: Diagnosis not present

## 2015-11-19 ENCOUNTER — Ambulatory Visit
Admission: RE | Admit: 2015-11-19 | Discharge: 2015-11-19 | Disposition: A | Discharge status: Home / Self Care | Payer: 59 | Source: Ambulatory Visit | Attending: Radiation Oncology | Admitting: Radiation Oncology

## 2015-11-19 ENCOUNTER — Encounter: Payer: Self-pay | Admitting: Radiation Oncology

## 2015-11-19 VITALS — BP 123/59 | HR 70 | Resp 16 | Wt 205.2 lb

## 2015-11-19 DIAGNOSIS — Z87891 Personal history of nicotine dependence: Secondary | ICD-10-CM | POA: Diagnosis not present

## 2015-11-19 DIAGNOSIS — C61 Malignant neoplasm of prostate: Secondary | ICD-10-CM | POA: Diagnosis not present

## 2015-11-19 DIAGNOSIS — I10 Essential (primary) hypertension: Secondary | ICD-10-CM | POA: Diagnosis not present

## 2015-11-19 DIAGNOSIS — G709 Myoneural disorder, unspecified: Secondary | ICD-10-CM | POA: Diagnosis not present

## 2015-11-19 DIAGNOSIS — Z510 Encounter for antineoplastic radiation therapy: Secondary | ICD-10-CM | POA: Diagnosis not present

## 2015-11-19 DIAGNOSIS — M545 Low back pain: Secondary | ICD-10-CM | POA: Diagnosis not present

## 2015-11-19 DIAGNOSIS — R9721 Rising PSA following treatment for malignant neoplasm of prostate: Secondary | ICD-10-CM | POA: Diagnosis not present

## 2015-11-19 DIAGNOSIS — G8929 Other chronic pain: Secondary | ICD-10-CM | POA: Diagnosis not present

## 2015-11-19 DIAGNOSIS — E039 Hypothyroidism, unspecified: Secondary | ICD-10-CM | POA: Diagnosis not present

## 2015-11-19 NOTE — Progress Notes (Signed)
Weight and vitals stable. Denies pain. Reports urgency is worse. Reports nocturia x 5-6. Denies hematuria, leakage, incontinence or difficulty emptying his bladder. Reports dysuria continues unchanged. Denies diarrhea. Reports mild fatigue.   BP 123/59 mmHg  Pulse 70  Resp 16  Wt 205 lb 3.2 oz (93.078 kg)  SpO2 100% Wt Readings from Last 3 Encounters:  11/19/15 205 lb 3.2 oz (93.078 kg)  11/12/15 206 lb 4.8 oz (93.577 kg)  11/06/15 204 lb 9.6 oz (92.806 kg)

## 2015-11-19 NOTE — Progress Notes (Signed)
  Radiation Oncology         [phone]   Name: Roger Durham MRN: [id number]   Date: 11/19/2015  DOB: 01-Nov-1955     Weekly Radiation Therapy Management    ICD-9-CM ICD-10-CM   1. Prostate cancer ([place]) 185 C61     Current Dose: 41.4 Gy  Planned Dose:  68.4 Gy  Narrative The patient presents for routine under treatment assessment.  Weight and vitals stable. Denies pain. Reports urgency is worse. Reports nocturia x 5-6. Denies hematuria, leakage, incontinence or difficulty emptying his bladder. Reports dysuria continues unchanged. Denies diarrhea. Reports mild fatigue. Uses pyridium off and on.  Set-up films were reviewed. The chart was checked.  Physical Findings  weight is 205 lb 3.2 oz (93.078 kg). His blood pressure is 123/59 and his pulse is 70. His respiration is 16 and oxygen saturation is 100%.   In general this is a well appearing middle [place] male in no acute distress. He's alert and oriented x4 and appropriate throughout the exam.   Impression The patient is tolerating radiation.  Plan Continue treatment as planned.     Sheral Apley Tammi Klippel, M.D.  This document serves as a record of services personally performed by Tyler Pita, MD. It was created on his behalf by Arlyce Harman, a trained medical scribe. The creation of this record is based on the scribe's personal observations and the provider's statements to them. This document has been checked and approved by the attending provider.

## 2015-11-20 ENCOUNTER — Ambulatory Visit
Admission: RE | Admit: 2015-11-20 | Discharge: 2015-11-20 | Disposition: A | Discharge status: Home / Self Care | Payer: 59 | Source: Ambulatory Visit | Attending: Radiation Oncology | Admitting: Radiation Oncology

## 2015-11-20 DIAGNOSIS — G709 Myoneural disorder, unspecified: Secondary | ICD-10-CM | POA: Diagnosis not present

## 2015-11-20 DIAGNOSIS — E039 Hypothyroidism, unspecified: Secondary | ICD-10-CM | POA: Diagnosis not present

## 2015-11-20 DIAGNOSIS — Z87891 Personal history of nicotine dependence: Secondary | ICD-10-CM | POA: Diagnosis not present

## 2015-11-20 DIAGNOSIS — R9721 Rising PSA following treatment for malignant neoplasm of prostate: Secondary | ICD-10-CM | POA: Diagnosis not present

## 2015-11-20 DIAGNOSIS — M545 Low back pain: Secondary | ICD-10-CM | POA: Diagnosis not present

## 2015-11-20 DIAGNOSIS — I10 Essential (primary) hypertension: Secondary | ICD-10-CM | POA: Diagnosis not present

## 2015-11-20 DIAGNOSIS — G8929 Other chronic pain: Secondary | ICD-10-CM | POA: Diagnosis not present

## 2015-11-20 DIAGNOSIS — Z510 Encounter for antineoplastic radiation therapy: Secondary | ICD-10-CM | POA: Diagnosis not present

## 2015-11-20 DIAGNOSIS — C61 Malignant neoplasm of prostate: Secondary | ICD-10-CM | POA: Diagnosis not present

## 2015-11-23 ENCOUNTER — Ambulatory Visit
Admission: RE | Admit: 2015-11-23 | Discharge: 2015-11-23 | Disposition: A | Discharge status: Home / Self Care | Payer: 59 | Source: Ambulatory Visit | Attending: Radiation Oncology | Admitting: Radiation Oncology

## 2015-11-23 DIAGNOSIS — Z510 Encounter for antineoplastic radiation therapy: Secondary | ICD-10-CM | POA: Diagnosis not present

## 2015-11-23 DIAGNOSIS — C61 Malignant neoplasm of prostate: Secondary | ICD-10-CM | POA: Diagnosis not present

## 2015-11-23 DIAGNOSIS — E039 Hypothyroidism, unspecified: Secondary | ICD-10-CM | POA: Diagnosis not present

## 2015-11-23 DIAGNOSIS — G8929 Other chronic pain: Secondary | ICD-10-CM | POA: Diagnosis not present

## 2015-11-23 DIAGNOSIS — I10 Essential (primary) hypertension: Secondary | ICD-10-CM | POA: Diagnosis not present

## 2015-11-23 DIAGNOSIS — G709 Myoneural disorder, unspecified: Secondary | ICD-10-CM | POA: Diagnosis not present

## 2015-11-23 DIAGNOSIS — M545 Low back pain: Secondary | ICD-10-CM | POA: Diagnosis not present

## 2015-11-23 DIAGNOSIS — Z87891 Personal history of nicotine dependence: Secondary | ICD-10-CM | POA: Diagnosis not present

## 2015-11-23 DIAGNOSIS — R9721 Rising PSA following treatment for malignant neoplasm of prostate: Secondary | ICD-10-CM | POA: Diagnosis not present

## 2015-11-24 ENCOUNTER — Ambulatory Visit
Admission: RE | Admit: 2015-11-24 | Discharge: 2015-11-24 | Disposition: A | Discharge status: Home / Self Care | Payer: 59 | Source: Ambulatory Visit | Attending: Radiation Oncology | Admitting: Radiation Oncology

## 2015-11-24 DIAGNOSIS — Z87891 Personal history of nicotine dependence: Secondary | ICD-10-CM | POA: Diagnosis not present

## 2015-11-24 DIAGNOSIS — G709 Myoneural disorder, unspecified: Secondary | ICD-10-CM | POA: Diagnosis not present

## 2015-11-24 DIAGNOSIS — G8929 Other chronic pain: Secondary | ICD-10-CM | POA: Diagnosis not present

## 2015-11-24 DIAGNOSIS — R9721 Rising PSA following treatment for malignant neoplasm of prostate: Secondary | ICD-10-CM | POA: Diagnosis not present

## 2015-11-24 DIAGNOSIS — C61 Malignant neoplasm of prostate: Secondary | ICD-10-CM | POA: Diagnosis not present

## 2015-11-24 DIAGNOSIS — Z510 Encounter for antineoplastic radiation therapy: Secondary | ICD-10-CM | POA: Diagnosis not present

## 2015-11-24 DIAGNOSIS — I10 Essential (primary) hypertension: Secondary | ICD-10-CM | POA: Diagnosis not present

## 2015-11-24 DIAGNOSIS — E039 Hypothyroidism, unspecified: Secondary | ICD-10-CM | POA: Diagnosis not present

## 2015-11-24 DIAGNOSIS — M545 Low back pain: Secondary | ICD-10-CM | POA: Diagnosis not present

## 2015-11-25 ENCOUNTER — Ambulatory Visit
Admission: RE | Admit: 2015-11-25 | Discharge: 2015-11-25 | Disposition: A | Discharge status: Home / Self Care | Payer: 59 | Source: Ambulatory Visit | Attending: Radiation Oncology | Admitting: Radiation Oncology

## 2015-11-25 DIAGNOSIS — Z87891 Personal history of nicotine dependence: Secondary | ICD-10-CM | POA: Diagnosis not present

## 2015-11-25 DIAGNOSIS — G8929 Other chronic pain: Secondary | ICD-10-CM | POA: Diagnosis not present

## 2015-11-25 DIAGNOSIS — R9721 Rising PSA following treatment for malignant neoplasm of prostate: Secondary | ICD-10-CM | POA: Diagnosis not present

## 2015-11-25 DIAGNOSIS — G709 Myoneural disorder, unspecified: Secondary | ICD-10-CM | POA: Diagnosis not present

## 2015-11-25 DIAGNOSIS — I10 Essential (primary) hypertension: Secondary | ICD-10-CM | POA: Diagnosis not present

## 2015-11-25 DIAGNOSIS — Z510 Encounter for antineoplastic radiation therapy: Secondary | ICD-10-CM | POA: Diagnosis not present

## 2015-11-25 DIAGNOSIS — E039 Hypothyroidism, unspecified: Secondary | ICD-10-CM | POA: Diagnosis not present

## 2015-11-25 DIAGNOSIS — C61 Malignant neoplasm of prostate: Secondary | ICD-10-CM | POA: Diagnosis not present

## 2015-11-25 DIAGNOSIS — M545 Low back pain: Secondary | ICD-10-CM | POA: Diagnosis not present

## 2015-11-26 ENCOUNTER — Ambulatory Visit
Admission: RE | Admit: 2015-11-26 | Discharge: 2015-11-26 | Disposition: A | Discharge status: Home / Self Care | Payer: 59 | Source: Ambulatory Visit | Attending: Radiation Oncology | Admitting: Radiation Oncology

## 2015-11-26 DIAGNOSIS — M545 Low back pain: Secondary | ICD-10-CM | POA: Diagnosis not present

## 2015-11-26 DIAGNOSIS — Z510 Encounter for antineoplastic radiation therapy: Secondary | ICD-10-CM | POA: Diagnosis not present

## 2015-11-26 DIAGNOSIS — Z87891 Personal history of nicotine dependence: Secondary | ICD-10-CM | POA: Diagnosis not present

## 2015-11-26 DIAGNOSIS — G709 Myoneural disorder, unspecified: Secondary | ICD-10-CM | POA: Diagnosis not present

## 2015-11-26 DIAGNOSIS — G8929 Other chronic pain: Secondary | ICD-10-CM | POA: Diagnosis not present

## 2015-11-26 DIAGNOSIS — C61 Malignant neoplasm of prostate: Secondary | ICD-10-CM | POA: Diagnosis not present

## 2015-11-26 DIAGNOSIS — R9721 Rising PSA following treatment for malignant neoplasm of prostate: Secondary | ICD-10-CM | POA: Diagnosis not present

## 2015-11-26 DIAGNOSIS — I10 Essential (primary) hypertension: Secondary | ICD-10-CM | POA: Diagnosis not present

## 2015-11-26 DIAGNOSIS — E039 Hypothyroidism, unspecified: Secondary | ICD-10-CM | POA: Diagnosis not present

## 2015-11-27 ENCOUNTER — Ambulatory Visit
Admission: RE | Admit: 2015-11-27 | Discharge: 2015-11-27 | Disposition: A | Discharge status: Home / Self Care | Payer: 59 | Source: Ambulatory Visit | Attending: Radiation Oncology | Admitting: Radiation Oncology

## 2015-11-27 DIAGNOSIS — C61 Malignant neoplasm of prostate: Secondary | ICD-10-CM | POA: Diagnosis not present

## 2015-11-27 DIAGNOSIS — G8929 Other chronic pain: Secondary | ICD-10-CM | POA: Diagnosis not present

## 2015-11-27 DIAGNOSIS — I10 Essential (primary) hypertension: Secondary | ICD-10-CM | POA: Diagnosis not present

## 2015-11-27 DIAGNOSIS — M545 Low back pain: Secondary | ICD-10-CM | POA: Diagnosis not present

## 2015-11-27 DIAGNOSIS — R9721 Rising PSA following treatment for malignant neoplasm of prostate: Secondary | ICD-10-CM | POA: Diagnosis not present

## 2015-11-27 DIAGNOSIS — G709 Myoneural disorder, unspecified: Secondary | ICD-10-CM | POA: Diagnosis not present

## 2015-11-27 DIAGNOSIS — E039 Hypothyroidism, unspecified: Secondary | ICD-10-CM | POA: Diagnosis not present

## 2015-11-27 DIAGNOSIS — Z510 Encounter for antineoplastic radiation therapy: Secondary | ICD-10-CM | POA: Diagnosis not present

## 2015-11-27 DIAGNOSIS — Z87891 Personal history of nicotine dependence: Secondary | ICD-10-CM | POA: Diagnosis not present

## 2015-11-27 NOTE — Progress Notes (Signed)
  Radiation Oncology         [phone]   Name: Roger Durham MRN: [id number]   Date: 11/27/2015  DOB: [birth year]     Weekly Radiation Therapy Management    ICD-9-CM ICD-10-CM   1. Prostate cancer ([place]) 185 C61     Current Dose: 52.2 Gy  Planned Dose:  68.4 Gy  Narrative The patient presents for routine under treatment assessment.  No complaints except ongoing dysuria.  Set-up films were reviewed. The chart was checked.  Physical Findings In general this is a well appearing middle [place] male in no acute distress. He's alert and oriented x4 and appropriate throughout the exam.   Impression The patient is tolerating radiation.  Plan Continue treatment as planned.     Sheral Apley Tammi Klippel, M.D.    This document serves as a record of services personally performed by Tyler Pita, MD. It was created on his behalf by Lendon Collar, a trained medical scribe. The creation of this record is based on the scribe's personal observations and the provider's statements to them. This document has been checked and approved by the attending provider.

## 2015-11-30 ENCOUNTER — Ambulatory Visit
Admission: RE | Admit: 2015-11-30 | Discharge: 2015-11-30 | Disposition: A | Discharge status: Home / Self Care | Payer: 59 | Source: Ambulatory Visit | Attending: Radiation Oncology | Admitting: Radiation Oncology

## 2015-11-30 DIAGNOSIS — G709 Myoneural disorder, unspecified: Secondary | ICD-10-CM | POA: Diagnosis not present

## 2015-11-30 DIAGNOSIS — I10 Essential (primary) hypertension: Secondary | ICD-10-CM | POA: Diagnosis not present

## 2015-11-30 DIAGNOSIS — M545 Low back pain: Secondary | ICD-10-CM | POA: Diagnosis not present

## 2015-11-30 DIAGNOSIS — G8929 Other chronic pain: Secondary | ICD-10-CM | POA: Diagnosis not present

## 2015-11-30 DIAGNOSIS — C61 Malignant neoplasm of prostate: Secondary | ICD-10-CM | POA: Diagnosis not present

## 2015-11-30 DIAGNOSIS — R9721 Rising PSA following treatment for malignant neoplasm of prostate: Secondary | ICD-10-CM | POA: Diagnosis not present

## 2015-11-30 DIAGNOSIS — E039 Hypothyroidism, unspecified: Secondary | ICD-10-CM | POA: Diagnosis not present

## 2015-11-30 DIAGNOSIS — Z510 Encounter for antineoplastic radiation therapy: Secondary | ICD-10-CM | POA: Diagnosis not present

## 2015-11-30 DIAGNOSIS — Z87891 Personal history of nicotine dependence: Secondary | ICD-10-CM | POA: Diagnosis not present

## 2015-12-01 ENCOUNTER — Ambulatory Visit
Admission: RE | Admit: 2015-12-01 | Discharge: 2015-12-01 | Disposition: A | Discharge status: Home / Self Care | Payer: 59 | Source: Ambulatory Visit | Attending: Radiation Oncology | Admitting: Radiation Oncology

## 2015-12-01 DIAGNOSIS — C61 Malignant neoplasm of prostate: Secondary | ICD-10-CM | POA: Diagnosis not present

## 2015-12-01 DIAGNOSIS — G709 Myoneural disorder, unspecified: Secondary | ICD-10-CM | POA: Diagnosis not present

## 2015-12-01 DIAGNOSIS — I10 Essential (primary) hypertension: Secondary | ICD-10-CM | POA: Diagnosis not present

## 2015-12-01 DIAGNOSIS — Z87891 Personal history of nicotine dependence: Secondary | ICD-10-CM | POA: Diagnosis not present

## 2015-12-01 DIAGNOSIS — M545 Low back pain: Secondary | ICD-10-CM | POA: Diagnosis not present

## 2015-12-01 DIAGNOSIS — E039 Hypothyroidism, unspecified: Secondary | ICD-10-CM | POA: Diagnosis not present

## 2015-12-01 DIAGNOSIS — R9721 Rising PSA following treatment for malignant neoplasm of prostate: Secondary | ICD-10-CM | POA: Diagnosis not present

## 2015-12-01 DIAGNOSIS — G8929 Other chronic pain: Secondary | ICD-10-CM | POA: Diagnosis not present

## 2015-12-01 DIAGNOSIS — Z510 Encounter for antineoplastic radiation therapy: Secondary | ICD-10-CM | POA: Diagnosis not present

## 2015-12-02 ENCOUNTER — Ambulatory Visit
Admission: RE | Admit: 2015-12-02 | Discharge: 2015-12-02 | Disposition: A | Discharge status: Home / Self Care | Payer: 59 | Source: Ambulatory Visit | Attending: Radiation Oncology | Admitting: Radiation Oncology

## 2015-12-02 DIAGNOSIS — Z87891 Personal history of nicotine dependence: Secondary | ICD-10-CM | POA: Diagnosis not present

## 2015-12-02 DIAGNOSIS — G8929 Other chronic pain: Secondary | ICD-10-CM | POA: Diagnosis not present

## 2015-12-02 DIAGNOSIS — I10 Essential (primary) hypertension: Secondary | ICD-10-CM | POA: Diagnosis not present

## 2015-12-02 DIAGNOSIS — G709 Myoneural disorder, unspecified: Secondary | ICD-10-CM | POA: Diagnosis not present

## 2015-12-02 DIAGNOSIS — C61 Malignant neoplasm of prostate: Secondary | ICD-10-CM | POA: Diagnosis not present

## 2015-12-02 DIAGNOSIS — E039 Hypothyroidism, unspecified: Secondary | ICD-10-CM | POA: Diagnosis not present

## 2015-12-02 DIAGNOSIS — M545 Low back pain: Secondary | ICD-10-CM | POA: Diagnosis not present

## 2015-12-02 DIAGNOSIS — Z510 Encounter for antineoplastic radiation therapy: Secondary | ICD-10-CM | POA: Diagnosis not present

## 2015-12-02 DIAGNOSIS — R9721 Rising PSA following treatment for malignant neoplasm of prostate: Secondary | ICD-10-CM | POA: Diagnosis not present

## 2015-12-03 ENCOUNTER — Ambulatory Visit
Admission: RE | Admit: 2015-12-03 | Discharge: 2015-12-03 | Disposition: A | Discharge status: Home / Self Care | Payer: 59 | Source: Ambulatory Visit | Attending: Radiation Oncology | Admitting: Radiation Oncology

## 2015-12-03 DIAGNOSIS — C61 Malignant neoplasm of prostate: Secondary | ICD-10-CM | POA: Diagnosis not present

## 2015-12-03 DIAGNOSIS — E039 Hypothyroidism, unspecified: Secondary | ICD-10-CM | POA: Diagnosis not present

## 2015-12-03 DIAGNOSIS — G709 Myoneural disorder, unspecified: Secondary | ICD-10-CM | POA: Diagnosis not present

## 2015-12-03 DIAGNOSIS — Z87891 Personal history of nicotine dependence: Secondary | ICD-10-CM | POA: Diagnosis not present

## 2015-12-03 DIAGNOSIS — G8929 Other chronic pain: Secondary | ICD-10-CM | POA: Diagnosis not present

## 2015-12-03 DIAGNOSIS — Z510 Encounter for antineoplastic radiation therapy: Secondary | ICD-10-CM | POA: Diagnosis not present

## 2015-12-03 DIAGNOSIS — R9721 Rising PSA following treatment for malignant neoplasm of prostate: Secondary | ICD-10-CM | POA: Diagnosis not present

## 2015-12-03 DIAGNOSIS — I10 Essential (primary) hypertension: Secondary | ICD-10-CM | POA: Diagnosis not present

## 2015-12-03 DIAGNOSIS — M545 Low back pain: Secondary | ICD-10-CM | POA: Diagnosis not present

## 2015-12-04 ENCOUNTER — Ambulatory Visit
Admission: RE | Admit: 2015-12-04 | Discharge: 2015-12-04 | Disposition: A | Discharge status: Home / Self Care | Payer: 59 | Source: Ambulatory Visit | Attending: Radiation Oncology | Admitting: Radiation Oncology

## 2015-12-04 ENCOUNTER — Ambulatory Visit: Admission: RE | Admit: 2015-12-04 | Payer: 59 | Source: Ambulatory Visit | Admitting: Radiation Oncology

## 2015-12-04 DIAGNOSIS — G709 Myoneural disorder, unspecified: Secondary | ICD-10-CM | POA: Diagnosis not present

## 2015-12-04 DIAGNOSIS — I10 Essential (primary) hypertension: Secondary | ICD-10-CM | POA: Diagnosis not present

## 2015-12-04 DIAGNOSIS — G8929 Other chronic pain: Secondary | ICD-10-CM | POA: Diagnosis not present

## 2015-12-04 DIAGNOSIS — E039 Hypothyroidism, unspecified: Secondary | ICD-10-CM | POA: Diagnosis not present

## 2015-12-04 DIAGNOSIS — M545 Low back pain: Secondary | ICD-10-CM | POA: Diagnosis not present

## 2015-12-04 DIAGNOSIS — C61 Malignant neoplasm of prostate: Secondary | ICD-10-CM | POA: Diagnosis not present

## 2015-12-04 DIAGNOSIS — Z87891 Personal history of nicotine dependence: Secondary | ICD-10-CM | POA: Diagnosis not present

## 2015-12-04 DIAGNOSIS — R9721 Rising PSA following treatment for malignant neoplasm of prostate: Secondary | ICD-10-CM | POA: Diagnosis not present

## 2015-12-04 DIAGNOSIS — Z510 Encounter for antineoplastic radiation therapy: Secondary | ICD-10-CM | POA: Diagnosis not present

## 2015-12-07 ENCOUNTER — Encounter: Payer: Self-pay | Admitting: Radiation Oncology

## 2015-12-07 ENCOUNTER — Ambulatory Visit
Admission: RE | Admit: 2015-12-07 | Discharge: 2015-12-07 | Disposition: A | Discharge status: Home / Self Care | Payer: 59 | Source: Ambulatory Visit | Attending: Radiation Oncology | Admitting: Radiation Oncology

## 2015-12-07 VITALS — BP 130/69 | HR 75 | Resp 16 | Wt 203.1 lb

## 2015-12-07 DIAGNOSIS — G8929 Other chronic pain: Secondary | ICD-10-CM | POA: Diagnosis not present

## 2015-12-07 DIAGNOSIS — M545 Low back pain: Secondary | ICD-10-CM | POA: Diagnosis not present

## 2015-12-07 DIAGNOSIS — I10 Essential (primary) hypertension: Secondary | ICD-10-CM | POA: Diagnosis not present

## 2015-12-07 DIAGNOSIS — E039 Hypothyroidism, unspecified: Secondary | ICD-10-CM | POA: Diagnosis not present

## 2015-12-07 DIAGNOSIS — C61 Malignant neoplasm of prostate: Secondary | ICD-10-CM | POA: Diagnosis not present

## 2015-12-07 DIAGNOSIS — R9721 Rising PSA following treatment for malignant neoplasm of prostate: Secondary | ICD-10-CM | POA: Diagnosis not present

## 2015-12-07 DIAGNOSIS — Z87891 Personal history of nicotine dependence: Secondary | ICD-10-CM | POA: Diagnosis not present

## 2015-12-07 DIAGNOSIS — Z510 Encounter for antineoplastic radiation therapy: Secondary | ICD-10-CM | POA: Diagnosis not present

## 2015-12-07 DIAGNOSIS — G709 Myoneural disorder, unspecified: Secondary | ICD-10-CM | POA: Diagnosis not present

## 2015-12-07 NOTE — Progress Notes (Signed)
  Radiation Oncology         [phone]   Name: Roger Durham MRN: [id number]   Date: 12/07/2015  DOB: September 28, 1955     Weekly Radiation Therapy Management    ICD-9-CM ICD-10-CM   1. Prostate cancer ([place]) 185 C61     Current Dose: 63.6 Gy  Planned Dose:  68.4 Gy  Narrative The patient presents for routine under treatment assessment.  Reports chronic back pain continues. Reports urgency is severe and almost to the point of incontinence. Reports nocturia x 5-6 continues. Reports dysuria remains, but pyridium seems to help. Denies hematuria. Reports when he has the urgency to urinate, he only passes approximately 30-50 cc at a time and does feel as though he empties completely. Describes urine stream as strong and steady. Denies diarrhea. Reports fatigue. A one month follow up appointment card given by the nurse.  Set-up films were reviewed. The chart was checked.  Physical Findings In general this is a well appearing [place] male in no acute distress. He's alert and oriented x4 and appropriate throughout the exam.   Impression The patient is tolerating radiation.  Plan Continue treatment as planned. He will follow up on May 18 after he completes treatment.     Sheral Apley Tammi Klippel, M.D.  This document serves as a record of services personally performed by Tyler Pita, MD. It was created on his behalf by Darcus Austin, a trained medical scribe. The creation of this record is based on the scribe's personal observations and the provider's statements to them. This document has been checked and approved by the attending provider.

## 2015-12-07 NOTE — Progress Notes (Addendum)
Weight and vitals stable. Reports chronic back pain continues. Reports urgency is severe and almost to the point of incontinence. Reports nocturia x 5-6 continues. Reports dysuria remain but, pyridium seems to help. Denies hematuria. Reports when he has the urgency to urinate he only passes approximately 30-50 cc at a time and does feel as though he empties completely. Describes urine stream as strong and steady. Denies diarrhea. Reports fatigue. One month follow up appointment card given.   BP 130/69 mmHg  Pulse 75  Resp 16  Wt 203 lb 1.6 oz (92.126 kg)  SpO2 100% Wt Readings from Last 3 Encounters:  12/07/15 203 lb 1.6 oz (92.126 kg)  11/19/15 205 lb 3.2 oz (93.078 kg)  11/12/15 206 lb 4.8 oz (93.577 kg)

## 2015-12-08 ENCOUNTER — Ambulatory Visit
Admission: RE | Admit: 2015-12-08 | Discharge: 2015-12-08 | Disposition: A | Discharge status: Home / Self Care | Payer: 59 | Source: Ambulatory Visit | Attending: Radiation Oncology | Admitting: Radiation Oncology

## 2015-12-08 DIAGNOSIS — C61 Malignant neoplasm of prostate: Secondary | ICD-10-CM | POA: Diagnosis not present

## 2015-12-08 DIAGNOSIS — R9721 Rising PSA following treatment for malignant neoplasm of prostate: Secondary | ICD-10-CM | POA: Diagnosis not present

## 2015-12-08 DIAGNOSIS — M545 Low back pain: Secondary | ICD-10-CM | POA: Diagnosis not present

## 2015-12-08 DIAGNOSIS — G709 Myoneural disorder, unspecified: Secondary | ICD-10-CM | POA: Diagnosis not present

## 2015-12-08 DIAGNOSIS — Z510 Encounter for antineoplastic radiation therapy: Secondary | ICD-10-CM | POA: Diagnosis not present

## 2015-12-08 DIAGNOSIS — Z87891 Personal history of nicotine dependence: Secondary | ICD-10-CM | POA: Diagnosis not present

## 2015-12-08 DIAGNOSIS — E039 Hypothyroidism, unspecified: Secondary | ICD-10-CM | POA: Diagnosis not present

## 2015-12-08 DIAGNOSIS — I10 Essential (primary) hypertension: Secondary | ICD-10-CM | POA: Diagnosis not present

## 2015-12-08 DIAGNOSIS — G8929 Other chronic pain: Secondary | ICD-10-CM | POA: Diagnosis not present

## 2015-12-09 ENCOUNTER — Ambulatory Visit
Admission: RE | Admit: 2015-12-09 | Discharge: 2015-12-09 | Disposition: A | Discharge status: Home / Self Care | Payer: 59 | Source: Ambulatory Visit | Attending: Radiation Oncology | Admitting: Radiation Oncology

## 2015-12-09 DIAGNOSIS — G8929 Other chronic pain: Secondary | ICD-10-CM | POA: Diagnosis not present

## 2015-12-09 DIAGNOSIS — Z510 Encounter for antineoplastic radiation therapy: Secondary | ICD-10-CM | POA: Diagnosis not present

## 2015-12-09 DIAGNOSIS — I10 Essential (primary) hypertension: Secondary | ICD-10-CM | POA: Diagnosis not present

## 2015-12-09 DIAGNOSIS — E039 Hypothyroidism, unspecified: Secondary | ICD-10-CM | POA: Diagnosis not present

## 2015-12-09 DIAGNOSIS — G709 Myoneural disorder, unspecified: Secondary | ICD-10-CM | POA: Diagnosis not present

## 2015-12-09 DIAGNOSIS — Z87891 Personal history of nicotine dependence: Secondary | ICD-10-CM | POA: Diagnosis not present

## 2015-12-09 DIAGNOSIS — C61 Malignant neoplasm of prostate: Secondary | ICD-10-CM | POA: Diagnosis not present

## 2015-12-09 DIAGNOSIS — R9721 Rising PSA following treatment for malignant neoplasm of prostate: Secondary | ICD-10-CM | POA: Diagnosis not present

## 2015-12-09 DIAGNOSIS — M545 Low back pain: Secondary | ICD-10-CM | POA: Diagnosis not present

## 2015-12-09 MED FILL — AMLODIPINE-BENAZEPRIL 5-10: 5-10 | 90 days supply | Qty: 90 | Fill #3

## 2015-12-09 MED FILL — LEVOTHYROXINE 88 MCG TABLET: 88 | 30 days supply | Qty: 30 | Fill #4

## 2015-12-10 ENCOUNTER — Ambulatory Visit
Admission: RE | Admit: 2015-12-10 | Discharge: 2015-12-10 | Disposition: A | Discharge status: Home / Self Care | Payer: 59 | Source: Ambulatory Visit | Attending: Radiation Oncology | Admitting: Radiation Oncology

## 2015-12-10 ENCOUNTER — Encounter: Payer: Self-pay | Admitting: Radiation Oncology

## 2015-12-10 VITALS — BP 119/68 | HR 70 | Resp 16 | Wt 203.7 lb

## 2015-12-10 DIAGNOSIS — I10 Essential (primary) hypertension: Secondary | ICD-10-CM | POA: Diagnosis not present

## 2015-12-10 DIAGNOSIS — Z87891 Personal history of nicotine dependence: Secondary | ICD-10-CM | POA: Diagnosis not present

## 2015-12-10 DIAGNOSIS — G709 Myoneural disorder, unspecified: Secondary | ICD-10-CM | POA: Diagnosis not present

## 2015-12-10 DIAGNOSIS — C61 Malignant neoplasm of prostate: Secondary | ICD-10-CM | POA: Diagnosis not present

## 2015-12-10 DIAGNOSIS — G8929 Other chronic pain: Secondary | ICD-10-CM | POA: Diagnosis not present

## 2015-12-10 DIAGNOSIS — R9721 Rising PSA following treatment for malignant neoplasm of prostate: Secondary | ICD-10-CM | POA: Diagnosis not present

## 2015-12-10 DIAGNOSIS — Z510 Encounter for antineoplastic radiation therapy: Secondary | ICD-10-CM | POA: Diagnosis not present

## 2015-12-10 DIAGNOSIS — E039 Hypothyroidism, unspecified: Secondary | ICD-10-CM | POA: Diagnosis not present

## 2015-12-10 DIAGNOSIS — M545 Low back pain: Secondary | ICD-10-CM | POA: Diagnosis not present

## 2015-12-10 NOTE — Progress Notes (Signed)
  Radiation Oncology         [phone]   Name: Roger Durham MRN: [id number]   Date: 12/10/2015  DOB: [birth year]     Weekly Radiation Therapy Management    ICD-9-CM ICD-10-CM   1. Prostate cancer ([place]) 185 C61     Current Dose: 68.4 Gy  Planned Dose:  68.4 Gy  Narrative The patient presents for routine under treatment assessment.  Weight and vitals stable. Reports chronic back pain continues. Reports urgency is severe and almost to the point of incontinence. Reports nocturia x 5-6 continues. Reports dysuria remain but, pyridium seems to help. Denies hematuria. Reports when he has the urgency to urinate he only passes approximately 30-50 cc at a time and does feel as though he empties completely. Describes urine stream as strong and steady. Denies diarrhea. Reports fatigue.  Set-up films were reviewed. The chart was checked.  Physical Findings In general this is a well appearing [place] male in no acute distress. He's alert and oriented x4 and appropriate throughout the exam. Blood pressure 119/68, pulse 70, resp. rate 16, weight 203 lb 11.2 oz (92.398 kg), SpO2 100 %.  Impression The patient is tolerating radiation.  Plan He has completed treatment today. He will follow up on May 18 after he completes treatment. He understands to contact this RN with future needs. He is scheduled to follow up with his primary care 02/02/2016 and his urologist Dr. Risa Grill in June.     Sheral Apley Tammi Klippel, M.D.    This document serves as a record of services personally performed by Tyler Pita, MD. It was created on his behalf by Lendon Collar, a trained medical scribe. The creation of this record is based on the scribe's personal observations and the provider's statements to them. This document has been checked and approved by the attending provider.

## 2015-12-10 NOTE — Progress Notes (Addendum)
Weight and vitals stable. Reports chronic back pain continues. Reports urgency is severe and almost to the point of incontinence. Reports nocturia x 5-6 continues. Reports dysuria remain but, pyridium seems to help. Denies hematuria. Reports when he has the urgency to urinate he only passes approximately 30-50 cc at a time and does feel as though he empties completely. Describes urine stream as strong and steady. Denies diarrhea. Reports fatigue. One month follow up appointment card given. Patient understands to contact this RN with future needs. Scheduled to follow up with PCP in May 23 and urologist, Dr. Risa Grill in June.   BP 119/68 mmHg  Pulse 70  Resp 16  Wt 203 lb 11.2 oz (92.398 kg)  SpO2 100% Wt Readings from Last 3 Encounters:  12/10/15 203 lb 11.2 oz (92.398 kg)  12/07/15 203 lb 1.6 oz (92.126 kg)  11/19/15 205 lb 3.2 oz (93.078 kg)

## 2015-12-14 MED FILL — METHADONE HCL 10 MG TABLET: 10 | 30 days supply | Qty: 90 | Fill #0

## 2015-12-23 DIAGNOSIS — M542 Cervicalgia: Secondary | ICD-10-CM | POA: Diagnosis not present

## 2015-12-23 DIAGNOSIS — M961 Postlaminectomy syndrome, not elsewhere classified: Secondary | ICD-10-CM | POA: Diagnosis not present

## 2015-12-23 DIAGNOSIS — M5440 Lumbago with sciatica, unspecified side: Secondary | ICD-10-CM | POA: Diagnosis not present

## 2015-12-23 DIAGNOSIS — M5412 Radiculopathy, cervical region: Secondary | ICD-10-CM | POA: Diagnosis not present

## 2015-12-30 ENCOUNTER — Telehealth: Payer: Self-pay | Admitting: Radiation Oncology

## 2015-12-30 DIAGNOSIS — R300 Dysuria: Secondary | ICD-10-CM

## 2015-12-30 MED ORDER — SULFAMETHOXAZOLE-TRIMETHOPRIM 800-160 MG PO TABS: 1.0000 | ORAL_TABLET | Freq: Two times a day (BID) | ORAL | Status: DC

## 2015-12-30 MED FILL — SULFAMETHOXAZOLE/TMP DS TAB: 800-160 | 5 days supply | Qty: 10 | Fill #0

## 2015-12-30 NOTE — Telephone Encounter (Signed)
Per PA Dara Lords' order called in Bactrim DS to [facility] @ [facility]. Phoned patient. No answer. Left message detailing that script was called in but, urine culture was recommended prior to starting medication. Also, explained AZO for three days is encouraged. Requested the patient phone this RN back with day and time he could present to provide sample.

## 2015-12-30 NOTE — Telephone Encounter (Signed)
-----   Message from Hayden Pedro, PA-C sent at 12/30/2015  3:10 PM EDT ----- Regarding: RE: Medication request Contact: [phone] I'm ok with giving him abx but I think it's at least worth culturing his urine before he starts abx. Can you see if he can come in for that? I'd do Bactrim DS #10, no refills one po BID. Can you call that in for me? He can also use AZO for pain for 3 days  ----- Message -----    From: Heywood Footman, RN    Sent: 12/30/2015  12:30 PM      To: Hayden Pedro, PA-C Subject: Medication request                             Roger Durham.  Our nurse anesthetist, Roger Durham, called this morning. He reports his LUTS resolved shortly after completion of xrt on 12/10/15 but, have returned worse. He reports urgency, dysuria, and frequency. Reports he constantly has the sensation to void but, when he goes only a few "dribbles comes out." He is requesting Bactrim be called to [place].   Sam

## 2015-12-30 NOTE — Addendum Note (Signed)
Addended by: Carola Rhine on: 12/30/2015 04:42 PM   Modules accepted: Orders

## 2015-12-31 ENCOUNTER — Other Ambulatory Visit (HOSPITAL_COMMUNITY)
Admission: RE | Admit: 2015-12-31 | Discharge: 2015-12-31 | Disposition: A | Discharge status: Home / Self Care | Payer: 59 | Source: Other Acute Inpatient Hospital | Attending: Radiation Oncology | Admitting: Radiation Oncology

## 2015-12-31 ENCOUNTER — Other Ambulatory Visit: Payer: Self-pay | Admitting: Radiation Oncology

## 2015-12-31 DIAGNOSIS — R300 Dysuria: Secondary | ICD-10-CM

## 2016-01-01 NOTE — Progress Notes (Signed)
  Radiation Oncology         [phone] ________________________________  Name: Roger Durham MRN: [id number]  Date: 12/10/2015  DOB: 1956/01/17  End of Treatment Note   ICD-9-CM ICD-10-CM    1. Prostate cancer ([place]) [place]     DIAGNOSIS: Roger Durham is a pleasant 59 y.o. male with a history of stage IIB adenocarcinoma of the prostate with a Gleason's score of 4+3 and most recent PSA of 0.14.     Indication for treatment:  Curative, Prostatic Fossa Radiotherapy       Radiation treatment dates:   10/20/2015-12/10/2015  Site/dose:   The prostatic fossa was treated to 68.4 Gy in 38 fractions of 1.8 Gy  Beams/energy:   The prostatic fossa was treated using helical intensity modulated radiotherapy delivering 6 megavolt photons. Image guidance was performed with megavoltage CT studies prior to each fraction. He was immobilized with a body fix lower extremity mold.  Narrative: The patient tolerated radiation treatment relatively well.   The patient experienced some modest fatigue. He also reported severe urgency almost to the point of incontinence and nocturia x 5-6. Dysuria improved with pyridium.   Plan: The patient has completed radiation treatment. He will return to radiation oncology clinic for routine followup in one month. I advised him to call or return sooner if he has any questions or concerns related to his recovery or treatment. ________________________________  Sheral Apley. Tammi Klippel, M.D.  This document serves as a record of services personally performed by Tyler Pita, MD. It was created on his behalf by Arlyce Harman, a trained medical scribe. The creation of this record is based on the scribe's personal observations and the provider's statements to them. This document has been checked and approved by the attending provider.

## 2016-01-02 LAB — URINE CULTURE: CULTURE: NO GROWTH

## 2016-01-05 ENCOUNTER — Telehealth: Payer: Self-pay | Admitting: Radiation Oncology

## 2016-01-05 NOTE — Telephone Encounter (Signed)
-----   Message from Hayden Pedro, [place] sent at 01/04/2016  8:04 AM EDT ----- Will you check in to see how he's doing? I'd still finish abx regardless, but his culture hasn't grown anything to date. ----- Message -----    From: [facility] [place]: 01/02/2016  10:52 AM      To: Hayden Pedro, PA-C

## 2016-01-05 NOTE — Telephone Encounter (Signed)
Phoned patient to explain there has been no growth to date reference urine sample. Patient verbalized understanding. Encouraged patient to continue antibiotics despite culture results. Patient confirms he took his last antibiotic today. Reports he began to feel better on Saturday after beginning antibiotics on Friday. Reports dysuria has improved a great deal but, still persist. Patient denies taking OTC AZO despite being encouraged to. Encouraged patient to contact this RN with any regression. Patient verbalized understanding and expressed appreciation for the call.

## 2016-01-08 MED FILL — LEVOTHYROXINE 88 MCG TABLET: 88 | 30 days supply | Qty: 30 | Fill #5

## 2016-01-14 ENCOUNTER — Ambulatory Visit: Payer: Self-pay | Admitting: Radiation Oncology

## 2016-01-14 MED FILL — METHADONE HCL 10 MG TABLET: 10 | 30 days supply | Qty: 90 | Fill #0

## 2016-01-22 MED FILL — QVAR 80 MCG ORAL INHALER: 80 | 30 days supply | Qty: 9 | Fill #5

## 2016-01-28 ENCOUNTER — Ambulatory Visit
Admission: RE | Admit: 2016-01-28 | Discharge: 2016-01-28 | Disposition: A | Discharge status: Home / Self Care | Payer: 59 | Source: Ambulatory Visit | Attending: Radiation Oncology | Admitting: Radiation Oncology

## 2016-01-28 ENCOUNTER — Encounter: Payer: Self-pay | Admitting: Radiation Oncology

## 2016-01-28 VITALS — BP 118/70 | HR 65 | Resp 16 | Wt 200.8 lb

## 2016-01-28 DIAGNOSIS — C61 Malignant neoplasm of prostate: Secondary | ICD-10-CM | POA: Insufficient documentation

## 2016-01-28 DIAGNOSIS — R300 Dysuria: Secondary | ICD-10-CM

## 2016-01-28 DIAGNOSIS — R32 Unspecified urinary incontinence: Secondary | ICD-10-CM

## 2016-01-28 LAB — URINALYSIS, MICROSCOPIC - CHCC
Bilirubin (Urine): NEGATIVE
COMMENTS:: <5
Glucose: NEGATIVE mg/dL
Ketones: NEGATIVE mg/dL
LEUKOCYTE ESTERASE: NEGATIVE
Nitrite: NEGATIVE
PROTEIN: NEGATIVE mg/dL
SPECIFIC GRAVITY, URINE: 1.015 (ref 1.003–1.035)
UROBILINOGEN UR: 0.2 mg/dL (ref 0.2–1)
pH: 5 (ref 4.6–8.0)

## 2016-01-28 MED ORDER — OXYBUTYNIN CHLORIDE ER 10 MG PO TB24: 10.0000 mg | ORAL_TABLET | Freq: Every day | ORAL | Status: DC

## 2016-01-28 MED FILL — OXYBUTYNIN CL ER 10 MG TAB: 10 | 30 days supply | Qty: 30 | Fill #0

## 2016-01-28 NOTE — Progress Notes (Signed)
Radiation Oncology         [phone] ________________________________  Name: Roger Durham MRN: [id number]  Date: 01/28/2016  DOB: Aug 15, 1956  Follow-Up Visit Note  CC: Irven Shelling, MD  [name] Snare, MD  Diagnosis:   Roger Durham is a pleasant 60 yo male with adenocarcinoma of the prostate with a Gleason's score of 4+3 and most recent PSa of 0.14.    ICD-9-CM ICD-10-CM   1. Prostate cancer ([facility]) 185 C61 Urinalysis, Microscopic - [facility]     Urine culture     oxybutynin (DITROPAN XL) 10 MG 24 hr tablet  2. Dysuria 788.1 R30.0   3. Incontinence 788.30 R32      Interval Since Last Radiation:  6 weeks. Completed radiation 10/20/2015-12/10/2015 to the prostatic fossa and was treated to 68.4 Gy in 38 fractions of 1.8 Gy.  Narrative:  The patient returns today for routine follow-up.  Weight and vitals stable. Denies pain. Reports intense urgency. Reports incontinence continues. Reports nocturia x 7-8. Reports dysuria resolved. Denies diarrhea. Reports urgency and incontinence deters him from leaving his house. Reports his energy level seems to be improving. Scheduled to follow up with [facility] the first week of June and PCP, Dr. Laurann Montana, next week.  He denies cloudy or foul-smelling urine. He mentions that he doesn't have pain when urinating, he just feels pressure like he has to urinate when he bends over. He mentions that he urinates every half hour, without obstruction, but only urinates a little.   ALLERGIES:  is allergic to penicillins.  Meds: Current Outpatient Prescriptions  Medication Sig Dispense Refill  . amLODipine-benazepril (LOTREL) 5-10 MG per capsule Take 1 capsule by mouth every morning.    Marland Kitchen aspirin 81 MG tablet Take 81 mg by mouth daily.    Marland Kitchen levothyroxine (SYNTHROID, LEVOTHROID) 88 MCG tablet   6  . methadone (DOLOPHINE) 10 MG tablet Take 10 mg by mouth 3 (three) times daily. For pain by pain management center    . QVAR 80 MCG/ACT inhaler   11  . simvastatin  (ZOCOR) 40 MG tablet   3  . oxybutynin (DITROPAN XL) 10 MG 24 hr tablet Take 1 tablet (10 mg total) by mouth at bedtime. 30 tablet 3   No current facility-administered medications for this encounter.    Physical Findings: The patient is in no acute distress. Patient is alert and oriented.  weight is 200 lb 12.8 oz (91.082 kg). His blood pressure is 118/70 and his pulse is 65. His respiration is 16 and oxygen saturation is 100%. .  No significant changes.  Lab Findings: Lab Results  Component Value Date   WBC 9.1 11/15/2012   HGB 14.1 11/22/2012   HCT 41.7 11/22/2012   PLT 242 11/15/2012    Lab Results  Component Value Date   NA 137 11/22/2012   K 4.2 11/22/2012   CO2 26 11/22/2012   GLUCOSE 118* 11/22/2012   BUN 10 11/22/2012   CREATININE 0.75 11/22/2012   BILITOT 0.4 11/15/2012   ALKPHOS 86 11/15/2012   AST 24 11/15/2012   ALT 19 11/15/2012   PROT 7.5 11/15/2012   ALBUMIN 4.1 11/15/2012   CALCIUM 8.7 11/22/2012    Radiographic Findings: No results found.  Impression:  The patient is recovering from the effects of radiation.  Plan:  We discussed over the counter products that could be used for UTI's. I ordered a UA for him to do today after this appointment. I prescribed him Ditropan to help  with his urgency and frequency. If he has a bladder infection, we will prescribe him antibiotics.  _____________________________________  Sheral Apley Tammi Klippel, M.D.    This document serves as a record of services personally performed by Tyler Pita, MD. It was created on his behalf by Lendon Collar, a trained medical scribe. The creation of this record is based on the scribe's personal observations and the provider's statements to them. This document has been checked and approved by the attending provider.

## 2016-01-28 NOTE — Progress Notes (Signed)
Weight and vitals stable. Denies pain. Reports intense urgency. Reports incontinence continues. Reports nocturia x 7-8. Reports dysuria resolved. Denies diarrhea. Reports urgency and incontinence deters him from leaving his house. Reports his energy level seems to be improving. Scheduled to follow up with [facility] the first week of June and PCP, Dr. Laurann Montana, next week.   BP 118/70 mmHg  Pulse 65  Resp 16  Wt 200 lb 12.8 oz (91.082 kg)  SpO2 100% Wt Readings from Last 3 Encounters:  01/28/16 200 lb 12.8 oz (91.082 kg)  12/10/15 203 lb 11.2 oz (92.398 kg)  12/07/15 203 lb 1.6 oz (92.126 kg)

## 2016-01-29 ENCOUNTER — Telehealth: Payer: Self-pay | Admitting: Radiation Oncology

## 2016-01-29 LAB — URINE CULTURE

## 2016-01-29 NOTE — Telephone Encounter (Signed)
Per Dr. Johny Shears order phoned patient with normal urinalysis results. Patient verbalized understanding and expressed appreciation for the call.

## 2016-01-29 NOTE — Progress Notes (Signed)
Quick Note:  Please call patient with normal result.  Initial test shows no evidence of infection. Will await culture for final result, may know by Monday.  In the meantime, continue the trial of Ditropan.  Thanks. MM ______

## 2016-01-30 NOTE — Progress Notes (Signed)
Quick Note:  Please call patient with normal result.  Thanks. MM ______ 

## 2016-02-02 ENCOUNTER — Telehealth: Payer: Self-pay | Admitting: Radiation Oncology

## 2016-02-02 DIAGNOSIS — R1032 Left lower quadrant pain: Secondary | ICD-10-CM | POA: Diagnosis not present

## 2016-02-02 DIAGNOSIS — R7301 Impaired fasting glucose: Secondary | ICD-10-CM | POA: Diagnosis not present

## 2016-02-02 DIAGNOSIS — E039 Hypothyroidism, unspecified: Secondary | ICD-10-CM | POA: Diagnosis not present

## 2016-02-02 DIAGNOSIS — C61 Malignant neoplasm of prostate: Secondary | ICD-10-CM | POA: Diagnosis not present

## 2016-02-02 DIAGNOSIS — I10 Essential (primary) hypertension: Secondary | ICD-10-CM | POA: Diagnosis not present

## 2016-02-02 DIAGNOSIS — J45909 Unspecified asthma, uncomplicated: Secondary | ICD-10-CM | POA: Diagnosis not present

## 2016-02-02 NOTE — Telephone Encounter (Signed)
Per Dr. Johny Shears order called Roger Durham with normal urine culture results. Patient reports his symptoms have not improved. Patient reports he is sitting in Dr. Delene Ruffini office now waiting to be seen. Patient plans to request that Dr. Delene Ruffini office fax over all notes to Dr. Tammi Klippel. Patient verbalized appreciation for the call.

## 2016-02-05 ENCOUNTER — Telehealth: Payer: Self-pay | Admitting: Radiation Oncology

## 2016-02-05 NOTE — Telephone Encounter (Signed)
Phoned patient to inquire about his status. Patient reports dysuria has resolved and urgency is "much less." Reports he was seen by Dr. Laurann Montana earlier this week and lab work was drawn. PSA proved to be up from 0.14 to 0.16. Reminded the patient the radiation is still working in him and the PSA drawn three months s/p radiation treatment is the one most reflective of how well he has responded to treatment. Also, his TSH was 3.86. Patient understands to contact this RN with worsening symptoms. Patient verbalized understanding and expressed appreciation for the call.

## 2016-02-09 MED FILL — LEVOTHYROXINE 88 MCG TABLET: 88 | 30 days supply | Qty: 30 | Fill #6

## 2016-02-10 MED FILL — SIMVASTATIN 40 MG TABLET: 40 | 90 days supply | Qty: 90 | Fill #0

## 2016-02-15 DIAGNOSIS — C61 Malignant neoplasm of prostate: Secondary | ICD-10-CM | POA: Diagnosis not present

## 2016-02-15 MED FILL — METHADONE HCL 10 MG TABLET: 10 | 30 days supply | Qty: 90 | Fill #0

## 2016-03-01 MED FILL — OXYBUTYNIN CL ER 10 MG TAB: 10 | 30 days supply | Qty: 30 | Fill #1

## 2016-03-08 MED FILL — AMLODIPINE-BENAZEPRIL 5-10: 5-10 | 90 days supply | Qty: 90 | Fill #0

## 2016-03-08 MED FILL — LEVOTHYROXINE 88 MCG TABLET: 88 | 30 days supply | Qty: 30 | Fill #0

## 2016-03-16 MED FILL — METHADONE HCL 10 MG TABLET: 10 | 30 days supply | Qty: 90 | Fill #0

## 2016-03-23 DIAGNOSIS — M5412 Radiculopathy, cervical region: Secondary | ICD-10-CM | POA: Diagnosis not present

## 2016-03-23 DIAGNOSIS — M5440 Lumbago with sciatica, unspecified side: Secondary | ICD-10-CM | POA: Diagnosis not present

## 2016-03-23 DIAGNOSIS — M542 Cervicalgia: Secondary | ICD-10-CM | POA: Diagnosis not present

## 2016-03-23 DIAGNOSIS — M961 Postlaminectomy syndrome, not elsewhere classified: Secondary | ICD-10-CM | POA: Diagnosis not present

## 2016-03-25 MED FILL — QVAR 80 MCG ORAL INHALER: 80 | 30 days supply | Qty: 9 | Fill #0

## 2016-03-25 MED FILL — OXYBUTYNIN CL ER 10 MG TAB: 10 | 30 days supply | Qty: 30 | Fill #2

## 2016-03-31 MED FILL — SULFAMETHOXAZOLE/TMP DS TAB: 800-160 | 7 days supply | Qty: 14 | Fill #0

## 2016-04-01 DIAGNOSIS — R350 Frequency of micturition: Secondary | ICD-10-CM | POA: Diagnosis not present

## 2016-04-11 MED FILL — LEVOTHYROXINE 88 MCG TABLET: 88 | 30 days supply | Qty: 30 | Fill #1

## 2016-04-18 MED FILL — METHADONE HCL 10 MG TABLET: 10 | 30 days supply | Qty: 90 | Fill #0

## 2016-05-09 MED FILL — SIMVASTATIN 40 MG TABLET: 40 | 90 days supply | Qty: 90 | Fill #1

## 2016-05-09 MED FILL — LEVOTHYROXINE 88 MCG TABLET: 88 | 30 days supply | Qty: 30 | Fill #2

## 2016-05-09 MED FILL — OXYBUTYNIN CL ER 10 MG TAB: 10 | 30 days supply | Qty: 30 | Fill #3

## 2016-05-30 MED FILL — METHADONE HCL 10 MG TABLET: 10 | 30 days supply | Qty: 90 | Fill #0

## 2016-06-06 MED FILL — AMLODIPINE-BENAZEPRIL 5-10: 5-10 | 90 days supply | Qty: 90 | Fill #1

## 2016-06-06 MED FILL — LEVOTHYROXINE 88 MCG TABLET: 88 | 30 days supply | Qty: 30 | Fill #3

## 2016-06-14 MED FILL — VENTOLIN HFA 90 MCG INHALER: 108 (90 BAS | 90 days supply | Qty: 54 | Fill #0

## 2016-06-14 MED FILL — QVAR 80 MCG ORAL INHALER: 80 | 30 days supply | Qty: 9 | Fill #1

## 2016-06-16 MED FILL — OXYBUTYNIN CL ER 10 MG TAB: 10 | 30 days supply | Qty: 30 | Fill #0

## 2016-06-21 ENCOUNTER — Telehealth: Payer: Self-pay | Admitting: *Deleted

## 2016-06-21 NOTE — Telephone Encounter (Signed)
On 06-21-16 fax medical records to Dr. Rosana Hoes, it was consult note, end of tx note, sim & planning note, follow up note

## 2016-06-30 MED FILL — METHADONE HCL 10 MG TABLET: 10 | 30 days supply | Qty: 90 | Fill #0

## 2016-07-11 DIAGNOSIS — M5412 Radiculopathy, cervical region: Secondary | ICD-10-CM | POA: Diagnosis not present

## 2016-07-11 DIAGNOSIS — Z6831 Body mass index (BMI) 31.0-31.9, adult: Secondary | ICD-10-CM | POA: Diagnosis not present

## 2016-07-11 DIAGNOSIS — M961 Postlaminectomy syndrome, not elsewhere classified: Secondary | ICD-10-CM | POA: Diagnosis not present

## 2016-07-11 DIAGNOSIS — M5440 Lumbago with sciatica, unspecified side: Secondary | ICD-10-CM | POA: Diagnosis not present

## 2016-07-11 DIAGNOSIS — M542 Cervicalgia: Secondary | ICD-10-CM | POA: Diagnosis not present

## 2016-07-11 MED FILL — QVAR 80 MCG ORAL INHALER: 80 | 30 days supply | Qty: 9 | Fill #0

## 2016-07-11 MED FILL — LEVOTHYROXINE 88 MCG TABLET: 88 | 30 days supply | Qty: 30 | Fill #4

## 2016-07-15 MED FILL — OXYBUTYNIN CL ER 10 MG TAB: 10 | 30 days supply | Qty: 30 | Fill #1

## 2016-07-27 DIAGNOSIS — Z0000 Encounter for general adult medical examination without abnormal findings: Secondary | ICD-10-CM | POA: Diagnosis not present

## 2016-07-27 DIAGNOSIS — C61 Malignant neoplasm of prostate: Secondary | ICD-10-CM | POA: Diagnosis not present

## 2016-07-27 DIAGNOSIS — R310 Gross hematuria: Secondary | ICD-10-CM | POA: Diagnosis not present

## 2016-07-27 DIAGNOSIS — R7301 Impaired fasting glucose: Secondary | ICD-10-CM | POA: Diagnosis not present

## 2016-07-27 DIAGNOSIS — I10 Essential (primary) hypertension: Secondary | ICD-10-CM | POA: Diagnosis not present

## 2016-07-27 DIAGNOSIS — E7800 Pure hypercholesterolemia, unspecified: Secondary | ICD-10-CM | POA: Diagnosis not present

## 2016-07-27 DIAGNOSIS — J45909 Unspecified asthma, uncomplicated: Secondary | ICD-10-CM | POA: Diagnosis not present

## 2016-07-27 DIAGNOSIS — Z23 Encounter for immunization: Secondary | ICD-10-CM | POA: Diagnosis not present

## 2016-07-27 DIAGNOSIS — E039 Hypothyroidism, unspecified: Secondary | ICD-10-CM | POA: Diagnosis not present

## 2016-07-29 MED FILL — SIMVASTATIN 40 MG TABLET: 40 | 90 days supply | Qty: 90 | Fill #0

## 2016-08-01 MED FILL — METHADONE HCL 10 MG TABLET: 10 | 30 days supply | Qty: 90 | Fill #0

## 2016-08-01 MED FILL — EPINEPHRINE 0.3 MG AUTO-INJ: 0.3 | 30 days supply | Qty: 2 | Fill #0

## 2016-08-02 MED FILL — QVAR 80 MCG ORAL INHALER: 80 | 90 days supply | Qty: 26 | Fill #0

## 2016-08-02 MED FILL — LEVOTHYROXINE 88 MCG TABLET: 88 | 90 days supply | Qty: 90 | Fill #0

## 2016-08-05 DIAGNOSIS — R3915 Urgency of urination: Secondary | ICD-10-CM | POA: Diagnosis not present

## 2016-08-05 DIAGNOSIS — C61 Malignant neoplasm of prostate: Secondary | ICD-10-CM | POA: Diagnosis not present

## 2016-08-05 DIAGNOSIS — R310 Gross hematuria: Secondary | ICD-10-CM | POA: Diagnosis not present

## 2016-08-05 DIAGNOSIS — N3040 Irradiation cystitis without hematuria: Secondary | ICD-10-CM | POA: Diagnosis not present

## 2016-08-05 DIAGNOSIS — R972 Elevated prostate specific antigen [PSA]: Secondary | ICD-10-CM | POA: Diagnosis not present

## 2016-08-05 MED FILL — VESIcare 10 MG TABS: 10 | 90 days supply | Qty: 90 | Fill #0

## 2016-08-08 DIAGNOSIS — H524 Presbyopia: Secondary | ICD-10-CM | POA: Diagnosis not present

## 2016-08-08 DIAGNOSIS — H52203 Unspecified astigmatism, bilateral: Secondary | ICD-10-CM | POA: Diagnosis not present

## 2016-08-08 MED FILL — DICLOFENAC SODIUM 1% GEL: 1 | 7 days supply | Qty: 100 | Fill #0

## 2016-08-19 DIAGNOSIS — C61 Malignant neoplasm of prostate: Secondary | ICD-10-CM | POA: Diagnosis not present

## 2016-08-19 DIAGNOSIS — R972 Elevated prostate specific antigen [PSA]: Secondary | ICD-10-CM | POA: Diagnosis not present

## 2016-08-19 DIAGNOSIS — N3040 Irradiation cystitis without hematuria: Secondary | ICD-10-CM | POA: Diagnosis not present

## 2016-08-19 DIAGNOSIS — N210 Calculus in bladder: Secondary | ICD-10-CM | POA: Diagnosis not present

## 2016-08-19 DIAGNOSIS — R310 Gross hematuria: Secondary | ICD-10-CM | POA: Diagnosis not present

## 2016-08-19 DIAGNOSIS — R3915 Urgency of urination: Secondary | ICD-10-CM | POA: Diagnosis not present

## 2016-08-30 DIAGNOSIS — Z79899 Other long term (current) drug therapy: Secondary | ICD-10-CM | POA: Diagnosis not present

## 2016-08-30 DIAGNOSIS — R3915 Urgency of urination: Secondary | ICD-10-CM | POA: Diagnosis not present

## 2016-08-30 DIAGNOSIS — T191XXA Foreign body in bladder, initial encounter: Secondary | ICD-10-CM | POA: Diagnosis not present

## 2016-08-30 DIAGNOSIS — E039 Hypothyroidism, unspecified: Secondary | ICD-10-CM | POA: Diagnosis not present

## 2016-08-30 DIAGNOSIS — R319 Hematuria, unspecified: Secondary | ICD-10-CM | POA: Diagnosis not present

## 2016-08-30 DIAGNOSIS — I10 Essential (primary) hypertension: Secondary | ICD-10-CM | POA: Diagnosis not present

## 2016-08-30 DIAGNOSIS — R350 Frequency of micturition: Secondary | ICD-10-CM | POA: Diagnosis not present

## 2016-08-30 DIAGNOSIS — N3041 Irradiation cystitis with hematuria: Secondary | ICD-10-CM | POA: Diagnosis not present

## 2016-08-30 DIAGNOSIS — Z923 Personal history of irradiation: Secondary | ICD-10-CM | POA: Diagnosis not present

## 2016-08-30 DIAGNOSIS — Z87891 Personal history of nicotine dependence: Secondary | ICD-10-CM | POA: Diagnosis not present

## 2016-08-30 DIAGNOSIS — Z7982 Long term (current) use of aspirin: Secondary | ICD-10-CM | POA: Diagnosis not present

## 2016-08-30 DIAGNOSIS — N210 Calculus in bladder: Secondary | ICD-10-CM | POA: Diagnosis not present

## 2016-08-30 DIAGNOSIS — R310 Gross hematuria: Secondary | ICD-10-CM | POA: Diagnosis not present

## 2016-08-30 DIAGNOSIS — Z8546 Personal history of malignant neoplasm of prostate: Secondary | ICD-10-CM | POA: Diagnosis not present

## 2016-08-31 MED FILL — VENTOLIN HFA 90 MCG INHALER: 108 (90 BAS | 90 days supply | Qty: 54 | Fill #1

## 2016-08-31 MED FILL — HYDROCODON-APAP 5-325: 5-325 | 4 days supply | Qty: 15 | Fill #0

## 2016-08-31 MED FILL — METHADONE HCL 10 MG TABLET: 10 | 30 days supply | Qty: 90 | Fill #0

## 2016-08-31 MED FILL — AMLODIPINE-BENAZEPRIL 5-10: 5-10 | 90 days supply | Qty: 90 | Fill #2

## 2016-10-05 MED FILL — METHADONE HCL 10 MG TABLET: 10 | 30 days supply | Qty: 90 | Fill #0

## 2016-10-25 DIAGNOSIS — M961 Postlaminectomy syndrome, not elsewhere classified: Secondary | ICD-10-CM | POA: Diagnosis not present

## 2016-10-25 DIAGNOSIS — Z6832 Body mass index (BMI) 32.0-32.9, adult: Secondary | ICD-10-CM | POA: Diagnosis not present

## 2016-10-25 DIAGNOSIS — M5412 Radiculopathy, cervical region: Secondary | ICD-10-CM | POA: Diagnosis not present

## 2016-10-25 DIAGNOSIS — M5440 Lumbago with sciatica, unspecified side: Secondary | ICD-10-CM | POA: Diagnosis not present

## 2016-11-07 MED FILL — SIMVASTATIN 40 MG TABLET: 40 | 90 days supply | Qty: 90 | Fill #1

## 2016-11-07 MED FILL — METHADONE HCL 10 MG TABLET: 10 | 30 days supply | Qty: 90 | Fill #0

## 2016-11-07 MED FILL — LEVOTHYROXINE 88 MCG TABLET: 88 | 90 days supply | Qty: 90 | Fill #1

## 2016-12-01 MED FILL — AMLODIPINE-BENAZEPRIL 5-10: 5-10 | 90 days supply | Qty: 90 | Fill #3

## 2016-12-07 MED FILL — METHADONE HCL 10 MG TABLET: 10 | 30 days supply | Qty: 90 | Fill #0

## 2017-01-09 MED FILL — METHADONE HCL 10 MG TABLET: 10 | 30 days supply | Qty: 90 | Fill #0

## 2017-01-16 DIAGNOSIS — I10 Essential (primary) hypertension: Secondary | ICD-10-CM | POA: Diagnosis not present

## 2017-01-16 DIAGNOSIS — J45909 Unspecified asthma, uncomplicated: Secondary | ICD-10-CM | POA: Diagnosis not present

## 2017-01-16 DIAGNOSIS — Z8546 Personal history of malignant neoplasm of prostate: Secondary | ICD-10-CM | POA: Diagnosis not present

## 2017-01-16 MED FILL — predniSONE 20 MG TABS: 20 | 5 days supply | Qty: 10 | Fill #0

## 2017-01-16 MED FILL — QVAR REDIHALER 80 MCG/ACT A: 80 | 30 days supply | Qty: 11 | Fill #0

## 2017-01-17 DIAGNOSIS — I10 Essential (primary) hypertension: Secondary | ICD-10-CM | POA: Diagnosis not present

## 2017-01-17 DIAGNOSIS — M5412 Radiculopathy, cervical region: Secondary | ICD-10-CM | POA: Diagnosis not present

## 2017-01-17 DIAGNOSIS — M5440 Lumbago with sciatica, unspecified side: Secondary | ICD-10-CM | POA: Diagnosis not present

## 2017-01-17 DIAGNOSIS — M961 Postlaminectomy syndrome, not elsewhere classified: Secondary | ICD-10-CM | POA: Diagnosis not present

## 2017-01-17 DIAGNOSIS — Z6832 Body mass index (BMI) 32.0-32.9, adult: Secondary | ICD-10-CM | POA: Diagnosis not present

## 2017-02-07 MED FILL — SIMVASTATIN 40 MG TABLET: 40 | 90 days supply | Qty: 90 | Fill #2

## 2017-02-07 MED FILL — LEVOTHYROXINE 88 MCG TABLET: 88 | 90 days supply | Qty: 90 | Fill #2

## 2017-02-08 MED FILL — METHADONE HCL 10 MG TABLET: 10 | 30 days supply | Qty: 90 | Fill #0

## 2017-02-09 MED FILL — QVAR REDIHALER 80 MCG/ACT A: 80 | 30 days supply | Qty: 11 | Fill #1

## 2017-03-06 DIAGNOSIS — R9721 Rising PSA following treatment for malignant neoplasm of prostate: Secondary | ICD-10-CM | POA: Diagnosis not present

## 2017-03-06 DIAGNOSIS — N3040 Irradiation cystitis without hematuria: Secondary | ICD-10-CM | POA: Diagnosis not present

## 2017-03-06 MED FILL — AMLODIPINE-BENAZEPRIL 5-10: 5-10 | 90 days supply | Qty: 90 | Fill #0

## 2017-03-17 MED FILL — QVAR REDIHALER 80 MCG/ACT A: 80 | 30 days supply | Qty: 11 | Fill #2

## 2017-03-17 MED FILL — METHADONE HCL 10 MG TABLET: 10 | 30 days supply | Qty: 90 | Fill #0

## 2017-04-04 DIAGNOSIS — M5440 Lumbago with sciatica, unspecified side: Secondary | ICD-10-CM | POA: Diagnosis not present

## 2017-04-04 DIAGNOSIS — Z6833 Body mass index (BMI) 33.0-33.9, adult: Secondary | ICD-10-CM | POA: Diagnosis not present

## 2017-04-04 DIAGNOSIS — M961 Postlaminectomy syndrome, not elsewhere classified: Secondary | ICD-10-CM | POA: Diagnosis not present

## 2017-04-04 DIAGNOSIS — M5412 Radiculopathy, cervical region: Secondary | ICD-10-CM | POA: Diagnosis not present

## 2017-04-04 DIAGNOSIS — I10 Essential (primary) hypertension: Secondary | ICD-10-CM | POA: Diagnosis not present

## 2017-04-20 MED FILL — METHADONE HCL 10 MG TABLET: 10 | 30 days supply | Qty: 90 | Fill #0

## 2017-05-08 MED FILL — LEVOTHYROXINE 88 MCG TABLET: 88 | 90 days supply | Qty: 90 | Fill #3

## 2017-05-08 MED FILL — SIMVASTATIN 40 MG TABLET: 40 | 90 days supply | Qty: 90 | Fill #3

## 2017-05-31 MED FILL — AMLODIPINE-BENAZEPRIL 5-10: 5-10 | 90 days supply | Qty: 90 | Fill #1

## 2017-05-31 MED FILL — METHADONE HCL 10 MG TABLET: 10 | 30 days supply | Qty: 90 | Fill #0

## 2017-06-29 DIAGNOSIS — M961 Postlaminectomy syndrome, not elsewhere classified: Secondary | ICD-10-CM | POA: Diagnosis not present

## 2017-06-29 DIAGNOSIS — I10 Essential (primary) hypertension: Secondary | ICD-10-CM | POA: Diagnosis not present

## 2017-06-29 DIAGNOSIS — M5412 Radiculopathy, cervical region: Secondary | ICD-10-CM | POA: Diagnosis not present

## 2017-06-29 DIAGNOSIS — Z6833 Body mass index (BMI) 33.0-33.9, adult: Secondary | ICD-10-CM | POA: Diagnosis not present

## 2017-06-29 DIAGNOSIS — M5440 Lumbago with sciatica, unspecified side: Secondary | ICD-10-CM | POA: Diagnosis not present

## 2017-07-03 MED FILL — METHADONE HCL 10 MG TABLET: 10 | 30 days supply | Qty: 90 | Fill #0

## 2017-07-28 DIAGNOSIS — Z23 Encounter for immunization: Secondary | ICD-10-CM | POA: Diagnosis not present

## 2017-07-28 DIAGNOSIS — Z8546 Personal history of malignant neoplasm of prostate: Secondary | ICD-10-CM | POA: Diagnosis not present

## 2017-07-28 DIAGNOSIS — E039 Hypothyroidism, unspecified: Secondary | ICD-10-CM | POA: Diagnosis not present

## 2017-07-28 DIAGNOSIS — F5104 Psychophysiologic insomnia: Secondary | ICD-10-CM | POA: Diagnosis not present

## 2017-07-28 DIAGNOSIS — R7301 Impaired fasting glucose: Secondary | ICD-10-CM | POA: Diagnosis not present

## 2017-07-28 DIAGNOSIS — Z0000 Encounter for general adult medical examination without abnormal findings: Secondary | ICD-10-CM | POA: Diagnosis not present

## 2017-07-28 DIAGNOSIS — J449 Chronic obstructive pulmonary disease, unspecified: Secondary | ICD-10-CM | POA: Diagnosis not present

## 2017-07-28 DIAGNOSIS — E7800 Pure hypercholesterolemia, unspecified: Secondary | ICD-10-CM | POA: Diagnosis not present

## 2017-07-28 DIAGNOSIS — I10 Essential (primary) hypertension: Secondary | ICD-10-CM | POA: Diagnosis not present

## 2017-07-28 MED FILL — traZODone HCL 50 MG TABS: 50 | 30 days supply | Qty: 30 | Fill #0

## 2017-08-07 MED FILL — SIMVASTATIN 40 MG TABLET: 40 | 90 days supply | Qty: 90 | Fill #0

## 2017-08-07 MED FILL — LEVOTHYROXINE 88 MCG TABLET: 88 | 90 days supply | Qty: 90 | Fill #0

## 2017-08-08 MED FILL — METHADONE HCL 10 MG TABLET: 10 | 30 days supply | Qty: 90 | Fill #0

## 2017-08-09 DIAGNOSIS — H52203 Unspecified astigmatism, bilateral: Secondary | ICD-10-CM | POA: Diagnosis not present

## 2017-08-09 DIAGNOSIS — H524 Presbyopia: Secondary | ICD-10-CM | POA: Diagnosis not present

## 2017-08-24 MED FILL — traZODone HCL 50 MG TABS: 50 | 30 days supply | Qty: 30 | Fill #1

## 2017-08-28 DIAGNOSIS — R9721 Rising PSA following treatment for malignant neoplasm of prostate: Secondary | ICD-10-CM | POA: Diagnosis not present

## 2017-08-28 DIAGNOSIS — N3040 Irradiation cystitis without hematuria: Secondary | ICD-10-CM | POA: Diagnosis not present

## 2017-08-28 DIAGNOSIS — C61 Malignant neoplasm of prostate: Secondary | ICD-10-CM | POA: Diagnosis not present

## 2017-08-28 DIAGNOSIS — R972 Elevated prostate specific antigen [PSA]: Secondary | ICD-10-CM | POA: Diagnosis not present

## 2017-09-04 MED FILL — AMLODIPINE-BENAZEPRIL 5-10: 5-10 | 90 days supply | Qty: 90 | Fill #0

## 2017-09-14 MED FILL — METHADONE HCL 10 MG TABLET: 10 | 30 days supply | Qty: 90 | Fill #0

## 2017-09-28 MED FILL — ATORVASTATIN 20 MG TABLET: 20 | 90 days supply | Qty: 90 | Fill #0

## 2017-10-17 MED FILL — METHADONE HCL 10 MG TABLET: 10 | 30 days supply | Qty: 90 | Fill #0

## 2017-11-03 MED FILL — LEVOTHYROXINE 88 MCG TABLET: 88 | 90 days supply | Qty: 90 | Fill #1

## 2017-11-15 MED FILL — METHADONE HCL 10 MG TABLET: 10 | 30 days supply | Qty: 90 | Fill #0

## 2017-11-30 MED FILL — AMLODIPINE-BENAZEPRIL 5-10: 5-10 | 90 days supply | Qty: 90 | Fill #1

## 2017-12-20 MED FILL — METHADONE HCL 10 MG TABLET: 10 | 30 days supply | Qty: 90 | Fill #0

## 2017-12-25 MED FILL — ATORVASTATIN 20 MG TABLET: 20 | 90 days supply | Qty: 90 | Fill #1

## 2018-01-22 MED FILL — METHADONE HCL 10 MG TABLET: 10 | 30 days supply | Qty: 90 | Fill #0

## 2018-01-31 MED FILL — LEVOTHYROXINE 88 MCG TABLET: 88 | 90 days supply | Qty: 90 | Fill #2

## 2018-02-26 MED FILL — METHADONE HCL 10 MG TABLET: 10 | 30 days supply | Qty: 90 | Fill #0

## 2018-02-26 MED FILL — AMLODIPINE-BENAZEPRIL 5-10: 5-10 | 90 days supply | Qty: 90 | Fill #2

## 2018-03-22 MED FILL — ATORVASTATIN 20 MG TABLET: 20 | 90 days supply | Qty: 90 | Fill #2

## 2018-04-09 MED FILL — MOXIFLOXACIN HCL 0.5 % SOLN: 0.5 | 15 days supply | Qty: 3 | Fill #0

## 2018-04-09 MED FILL — PROLENSA 0.07% EYE DROPS: 0.07 | 60 days supply | Qty: 3 | Fill #0

## 2018-04-09 MED FILL — METHADONE HCL 10 MG TABLET: 10 | 30 days supply | Qty: 90 | Fill #0

## 2018-04-09 MED FILL — PREDNISOLONE AC 1% EYE DROP: 1 | 25 days supply | Qty: 5 | Fill #0

## 2018-04-30 MED FILL — MOXIFLOXACIN HCL 0.5 % SOLN: 0.5 | 30 days supply | Qty: 3 | Fill #0

## 2018-05-01 MED FILL — LEVOTHYROXINE 88 MCG TABLET: 88 | 90 days supply | Qty: 90 | Fill #3

## 2018-05-07 MED FILL — METHADONE HCL 10 MG TABLET: 10 | 30 days supply | Qty: 90 | Fill #0

## 2018-05-11 MED FILL — PREDNISOLONE AC 1% EYE DROP: 1 | 50 days supply | Qty: 10 | Fill #0

## 2018-05-25 MED FILL — AMLODIPINE-BENAZEPRIL 5-10: 5-10 | 90 days supply | Qty: 90 | Fill #3

## 2018-06-18 MED FILL — METHADONE HCL 10 MG TABLET: 10 | 30 days supply | Qty: 90 | Fill #0

## 2018-06-19 MED FILL — ATORVASTATIN CALCIUM 20 MG: 20 | 90 days supply | Qty: 90 | Fill #3

## 2018-06-19 MED FILL — EPINEPHRINE 0.3 MG AUTO-INJ: 0.3 | 30 days supply | Qty: 2 | Fill #0

## 2018-07-23 MED FILL — LEVOTHYROXINE 88 MCG TABLET: 88 | 90 days supply | Qty: 90 | Fill #0

## 2018-07-23 MED FILL — METHADONE HCL 10 MG TABLET: 10 | 30 days supply | Qty: 90 | Fill #0

## 2018-07-30 MED FILL — SHINGRIX 50 MCG SUS: 50 | 1 days supply | Qty: 1 | Fill #0

## 2018-08-01 MED FILL — LEVOTHYROXINE 100 MCG TABLE: 100 | 90 days supply | Qty: 90 | Fill #0

## 2018-08-21 MED FILL — AMLODIPINE-BENAZEPRIL 5-10: 5-10 | 90 days supply | Qty: 90 | Fill #0

## 2018-08-24 MED FILL — METHADONE HCL 10 MG TABLET: 10 | 30 days supply | Qty: 90 | Fill #0

## 2018-09-17 MED FILL — ATORVASTATIN CALCIUM 20 MG: 20 | 90 days supply | Qty: 90 | Fill #0

## 2018-09-25 ENCOUNTER — Other Ambulatory Visit: Payer: Self-pay | Admitting: Urology

## 2018-10-01 MED FILL — METHADONE HCL 10 MG TABS: 10 | 30 days supply | Qty: 90 | Fill #0

## 2018-10-05 ENCOUNTER — Encounter (HOSPITAL_BASED_OUTPATIENT_CLINIC_OR_DEPARTMENT_OTHER): Payer: Self-pay | Admitting: *Deleted

## 2018-10-05 ENCOUNTER — Other Ambulatory Visit: Payer: Self-pay

## 2018-10-05 NOTE — Progress Notes (Signed)
Spoke with Roger Durham after midnight, arrive 1-15 am 10-12-18 [facility] meds to take sip of water: methadone, levothyroxine Driver rana wife  has surgery orders in epic Needs I stat 4 and ekg

## 2018-10-09 MED FILL — SHINGRIX 50 MCG SUS: 50 | 1 days supply | Qty: 1 | Fill #1

## 2018-10-11 NOTE — H&P (Signed)
Office Visit Report     09/21/2018   --------------------------------------------------------------------------------   Roger Durham  MRN: [id number]  PRIMARY CARE:  Delanna Ahmadi, MD  DOB: April 26, 1956, 63 year old Male  REFERRING:  Delanna Ahmadi, MD  SSN: [id number]  PROVIDER:  Festus Aloe, M.D.    LOCATION:  [facility] [phone]   --------------------------------------------------------------------------------   CC: I have blood in my urine.  HPI: Roger Durham is a 63 year-old male established patient who is here for blood in the urine.  He did see the blood in his urine. He first noticed the symptoms approximately 09/01/2018. He has seen blood clots.   He does have a burning sensation when he urinates. He is currently having trouble urinating.   He is having pain.   Bending and working in the yard causes hematuria. It clears quickly.   He underwent a robotic assisted laparoscopic radical prostatectomy in 2014 by Dr. [name] Snare. By history, he had negative margins and a Gleason score 7, 4+3 adenocarcinoma. His PSA subsequently increased to 0.14. Of note, he was noted to be a pathological T2c. He subsequently underwent external beam radiation by Dr. Tammi Klippel in [place]. He was given 68.4 Gy and 38 fractions from 10/20/15 until 12/10/15. The PSA was 0.09 on 07/27/16. He was concerned because PSA was 0.16 in 01/2016, but it dropped to 0.09 on 07/27/16 and was 0.06 on 01/16/17. At cystoscopy, he was found to have some staples at the bladder neck with stones and underwent cystoscopy and removal of the stones and staples on 08/30/16.   Last PSA was 0.15 Jul 2018. Bun 22, Cr 0.78. CT was done 09/17/2018 and was benign. Faint calcifications at the dorsal vein. He's had no further gross hematuria. He has noticed passing small brown pieces of material. No dysuria, but he feels "like it's the staples". We discussed the limitations of office cysto. he elects to proceed.    He's a nurse anesthetist.     ALLERGIES: No Allergies No Known Drug Allergies    MEDICATIONS: Adult Aspirin Low Strength 81 MG TBDP Oral  Cialis 20 mg tablet 0 Oral  Levothyroxine Sodium 88 MCG Oral Tablet Oral  Methadone HCl - 10 MG Oral Tablet Oral  Simvastatin 40 MG Oral Tablet Oral     GU PSH: Laparoscopy; Lymphadenectomy - 2014 Laparoscopy; Lymphadenectomy Locm 300-399Mg /Ml Iodine,1Ml - 09/17/2018 Radical Prostatectomy Robotic Radical Prostatectomy - 2014      [place] Notes: Laparoscopy With Bilateral Total Pelvic Lymphadenectomy, Prostatect Retropubic Radical W/ Nerve Sparing Laparoscopic, Back Surgery   NON-GU PSH: Lumbar Laminectomy    GU PMH: Gross hematuria - 09/11/2018 History of prostate cancer (Stable) - 09/11/2018, History of prostate cancer, - 2016 Prostate Cancer - 2017, Prostate cancer, - 2016 ED due to arterial insufficiency, Erectile dysfunction due to arterial insufficiency - 2016 Urinary Urgency, Urinary urgency - 2015 Elevated PSA, Elevated prostate specific antigen (PSA) - 2014 Other microscopic hematuria, Microscopic hematuria - 2014    NON-GU PMH: Encounter for general adult medical examination without abnormal findings, Encounter for preventive health examination - 2015 Muscle weakness (generalized), Muscle weakness - 2014 Personal history of other diseases of the circulatory system, History of hypertension - 2014 Personal history of other endocrine, nutritional and metabolic disease, History of hypothyroidism - 2014 Hypertension Hypothyroidism    FAMILY HISTORY: Death In The Family Father - Father Death In The Family Mother - Mother Family Health Status Number - Runs In Family No Significant Family History -  Runs In Family   SOCIAL HISTORY: Marital Status: Married Current Smoking Status: Patient does not smoke anymore.  Has never drank.  Drinks 3 caffeinated drinks per day. 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     Notes:  Former smoker, Tobacco use, Exercise Habits, Activities Of Daily Living, Self-reliant In Usual Daily Activities, Living Independently With Spouse, Occupation:, Alcohol Use, Marital History - Currently Married, Caffeine Use   REVIEW OF SYSTEMS:    GU Review Male:   Patient denies frequent urination, hard to postpone urination, burning/ pain with urination, get up at night to urinate, leakage of urine, stream starts and stops, trouble starting your stream, have to strain to urinate , erection problems, and penile pain.  Gastrointestinal (Upper):   Patient denies nausea, vomiting, and indigestion/ heartburn.  Gastrointestinal (Lower):   Patient denies constipation and diarrhea.  Constitutional:   Patient denies fever, night sweats, weight loss, and fatigue.  Skin:   Patient denies skin rash/ lesion and itching.  Eyes:   Patient denies blurred vision and double vision.  Ears/ Nose/ Throat:   Patient denies sore throat and sinus problems.  Hematologic/Lymphatic:   Patient denies swollen glands and easy bruising.  Cardiovascular:   Patient denies leg swelling and chest pains.  Respiratory:   Patient denies cough and shortness of breath.  Endocrine:   Patient denies excessive thirst.  Musculoskeletal:   Patient denies back pain and joint pain.  Neurological:   Patient denies headaches and dizziness.  Psychologic:   Patient denies depression and anxiety.   VITAL SIGNS:      09/21/2018 09:47 AM  BP 112/67 mmHg  Pulse 73 /min  Temperature 98.4 F / 36.8 C   GU PHYSICAL EXAMINATION:    Scrotum: No lesions. No edema. No cysts. No warts.  Urethral Meatus: Normal size. No lesion, no wart, no discharge, no polyp. Normal location.  Penis: Circumcised, no warts, no cracks. No dorsal Peyronie's plaques, no left corporal Peyronie's plaques, no right corporal Peyronie's plaques, no scarring, no warts. No balanitis, no meatal stenosis.   MULTI-SYSTEM PHYSICAL EXAMINATION:    Constitutional: Well-nourished.  No physical deformities. Normally developed. Good grooming.  Neck: Neck symmetrical, not swollen. Normal tracheal position.  Respiratory: No labored breathing, no use of accessory muscles.   Cardiovascular: Normal temperature, normal extremity pulses, no swelling, no varicosities.  Skin: No paleness, no jaundice, no cyanosis. No lesion, no ulcer, no rash.  Neurologic / Psychiatric: Oriented to time, oriented to place, oriented to person. No depression, no anxiety, no agitation.  Gastrointestinal: No mass, no tenderness, no rigidity, non obese abdomen.     PAST DATA REVIEWED:  Source Of History:  Patient  X-Ray Review: C.T. Abdomen/Pelvis: Reviewed Films. 09/17/2018    09/09/15 03/22/13 08/16/12  PSA  Total PSA 0.14  <0.01  5.61   Free PSA   0.81   % Free PSA   14     05/30/06  Hormones  Testosterone, Total 3.81     PROCEDURES:         Flexible Cystoscopy - 52000  Risks, benefits, and some of the potential complications of the procedure were discussed with the patient. All questions were answered. Informed consent was obtained. Antibiotic prophylaxis was given -- Cephalexin. Sterile technique and intraurethral analgesia were used.  Meatus:  Normal size. Normal location. Normal condition.  Urethra:  No strictures.  External Sphincter:  Normal.  Verumontanum:  Verumontanum Surgically Absent.  Prostate:  Prostate Surgically Absent.  Bladder Neck:  Non-obstructing.  Ureteral Orifices:  Normal location. Normal size. Normal shape. Effluxed clear urine.  Bladder:  No trabeculation. No stones. The exterior bladder wall as a patch of papillary-appearing tumors - almost looked like "raspberries".      The lower urinary tract was carefully examined. The procedure was well-tolerated and without complications. Antibiotic instructions were given. Instructions were given to call the office immediately for bloody urine, difficulty urinating, painful urination, fever, chills, nausea, vomiting or  other illness. The patient stated that he understood these instructions and would comply with them.         Urinalysis w/Scope Dipstick Dipstick Cont'd Micro  Color: Yellow Bilirubin: Neg mg/dL WBC/hpf: 0 - 5/hpf  Appearance: Cloudy Ketones: Neg mg/dL RBC/hpf: >60/hpf  Specific Gravity: 1.025 Blood: 3+ ery/uL Bacteria: Rare (0-9/hpf)  pH: 5.5 Protein: Trace mg/dL Cystals: NS (Not Seen)  Glucose: Neg mg/dL Urobilinogen: 0.2 mg/dL Casts: NS (Not Seen)    Nitrites: Neg Trichomonas: Not Present    Leukocyte Esterase: Neg leu/uL Mucous: Not Present      Epithelial Cells: NS (Not Seen)      Yeast: NS (Not Seen)      Sperm: Not Present    ASSESSMENT:      ICD-10 Details  1 GU:   Gross hematuria - R31.0    PLAN:           Schedule Return Visit/Planned Activity: Next Available Appointment - Schedule Surgery          Document Letter(s):  Created for Patient: Clinical Summary         Notes:   I discussed with the patient the nature of this benefits and alternatives to cystoscopy with bladder biopsy and fulguration. All questions answered. He elects to proceed. We discussed the nature of these lesions may be malignant or related to prior radiation. I didn't see any evidence of stone or staples at the bladder neck.    * Signed by Festus Aloe, M.D. on 09/21/18 at 4:49 PM (EST)*     The information contained in this medical record document is considered private and confidential patient information. This information can only be used for the medical diagnosis and/or medical services that are being provided by the patient's selected caregivers. This information can only be distributed outside of the patient's care if the patient agrees and signs waivers of authorization for this information to be sent to an outside source or route.

## 2018-10-12 ENCOUNTER — Ambulatory Visit (HOSPITAL_BASED_OUTPATIENT_CLINIC_OR_DEPARTMENT_OTHER)
Admission: RE | Admit: 2018-10-12 | Discharge: 2018-10-12 | Disposition: A | Discharge status: Home / Self Care | Payer: No Typology Code available for payment source | Attending: Urology | Admitting: Urology

## 2018-10-12 ENCOUNTER — Ambulatory Visit (HOSPITAL_BASED_OUTPATIENT_CLINIC_OR_DEPARTMENT_OTHER): Payer: No Typology Code available for payment source | Admitting: Anesthesiology

## 2018-10-12 ENCOUNTER — Other Ambulatory Visit: Payer: Self-pay

## 2018-10-12 ENCOUNTER — Encounter (HOSPITAL_BASED_OUTPATIENT_CLINIC_OR_DEPARTMENT_OTHER): Payer: Self-pay | Admitting: *Deleted

## 2018-10-12 ENCOUNTER — Encounter (HOSPITAL_BASED_OUTPATIENT_CLINIC_OR_DEPARTMENT_OTHER)
Admission: RE | Disposition: A | Discharge status: Home / Self Care | Payer: Self-pay | Source: Home / Self Care | Attending: Urology

## 2018-10-12 DIAGNOSIS — Z79891 Long term (current) use of opiate analgesic: Secondary | ICD-10-CM | POA: Diagnosis not present

## 2018-10-12 DIAGNOSIS — Z7982 Long term (current) use of aspirin: Secondary | ICD-10-CM | POA: Insufficient documentation

## 2018-10-12 DIAGNOSIS — N3289 Other specified disorders of bladder: Secondary | ICD-10-CM

## 2018-10-12 DIAGNOSIS — Y842 Radiological procedure and radiotherapy as the cause of abnormal reaction of the patient, or of later complication, without mention of misadventure at the time of the procedure: Secondary | ICD-10-CM | POA: Insufficient documentation

## 2018-10-12 DIAGNOSIS — Z79899 Other long term (current) drug therapy: Secondary | ICD-10-CM | POA: Insufficient documentation

## 2018-10-12 DIAGNOSIS — R310 Gross hematuria: Secondary | ICD-10-CM | POA: Diagnosis present

## 2018-10-12 DIAGNOSIS — Z87891 Personal history of nicotine dependence: Secondary | ICD-10-CM | POA: Insufficient documentation

## 2018-10-12 DIAGNOSIS — E039 Hypothyroidism, unspecified: Secondary | ICD-10-CM | POA: Diagnosis not present

## 2018-10-12 DIAGNOSIS — Z8546 Personal history of malignant neoplasm of prostate: Secondary | ICD-10-CM | POA: Insufficient documentation

## 2018-10-12 DIAGNOSIS — Z79890 Hormone replacement therapy (postmenopausal): Secondary | ICD-10-CM | POA: Insufficient documentation

## 2018-10-12 DIAGNOSIS — N3041 Irradiation cystitis with hematuria: Secondary | ICD-10-CM | POA: Diagnosis not present

## 2018-10-12 DIAGNOSIS — I10 Essential (primary) hypertension: Secondary | ICD-10-CM | POA: Insufficient documentation

## 2018-10-12 HISTORY — DX: Hematuria, unspecified: R31.9

## 2018-10-12 HISTORY — PX: CYSTOSCOPY WITH BIOPSY: SHX5122

## 2018-10-12 LAB — POCT I-STAT 4, (NA,K, GLUC, HGB,HCT)
Glucose, Bld: 95 mg/dL (ref 70–99)
HCT: 56 % — ABNORMAL HIGH (ref 39.0–52.0)
Hemoglobin: 19 g/dL — ABNORMAL HIGH (ref 13.0–17.0)
Potassium: 3.8 mmol/L (ref 3.5–5.1)
Sodium: 140 mmol/L (ref 135–145)

## 2018-10-12 SURGERY — CYSTOSCOPY, WITH BIOPSY
Anesthesia: General | Site: Urethra

## 2018-10-12 MED ORDER — STERILE WATER FOR IRRIGATION IR SOLN
Status: DC | PRN
  Administered 2018-10-12: 3000 mL

## 2018-10-12 MED ORDER — ASPIRIN 81 MG PO TABS: 81.0000 mg | ORAL_TABLET | Freq: Every day | ORAL | Status: AC

## 2018-10-12 MED ORDER — FENTANYL CITRATE (PF) 100 MCG/2ML IJ SOLN
25.0000 ug | INTRAMUSCULAR | Status: DC | PRN
  Filled 2018-10-12: qty 1

## 2018-10-12 MED ORDER — DEXAMETHASONE SODIUM PHOSPHATE 10 MG/ML IJ SOLN
INTRAMUSCULAR | Status: AC
  Filled 2018-10-12: qty 1

## 2018-10-12 MED ORDER — FENTANYL CITRATE (PF) 100 MCG/2ML IJ SOLN
INTRAMUSCULAR | Status: AC
  Filled 2018-10-12: qty 2

## 2018-10-12 MED ORDER — OXYCODONE HCL 5 MG PO TABS
5.0000 mg | ORAL_TABLET | Freq: Once | ORAL | Status: DC | PRN
  Filled 2018-10-12: qty 1

## 2018-10-12 MED ORDER — LIDOCAINE HCL URETHRAL/MUCOSAL 2 % EX GEL
CUTANEOUS | Status: DC | PRN
  Administered 2018-10-12: 1 via URETHRAL

## 2018-10-12 MED ORDER — KETOROLAC TROMETHAMINE 30 MG/ML IJ SOLN
INTRAMUSCULAR | Status: DC | PRN
  Administered 2018-10-12: 30 mg via INTRAVENOUS

## 2018-10-12 MED ORDER — ONDANSETRON HCL 4 MG/2ML IJ SOLN
4.0000 mg | Freq: Once | INTRAMUSCULAR | Status: DC | PRN
  Filled 2018-10-12: qty 2

## 2018-10-12 MED ORDER — OXYCODONE HCL 5 MG/5ML PO SOLN
5.0000 mg | Freq: Once | ORAL | Status: DC | PRN
  Filled 2018-10-12: qty 5

## 2018-10-12 MED ORDER — CEFAZOLIN SODIUM-DEXTROSE 2-4 GM/100ML-% IV SOLN
INTRAVENOUS | Status: AC
  Filled 2018-10-12: qty 100

## 2018-10-12 MED ORDER — KETOROLAC TROMETHAMINE 30 MG/ML IJ SOLN
INTRAMUSCULAR | Status: AC
  Filled 2018-10-12: qty 1

## 2018-10-12 MED ORDER — LIDOCAINE 2% (20 MG/ML) 5 ML SYRINGE
INTRAMUSCULAR | Status: DC | PRN
  Administered 2018-10-12: 80 mg via INTRAVENOUS

## 2018-10-12 MED ORDER — ONDANSETRON HCL 4 MG/2ML IJ SOLN
INTRAMUSCULAR | Status: AC
  Filled 2018-10-12: qty 2

## 2018-10-12 MED ORDER — ONDANSETRON HCL 4 MG/2ML IJ SOLN
INTRAMUSCULAR | Status: DC | PRN
  Administered 2018-10-12: 4 mg via INTRAVENOUS

## 2018-10-12 MED ORDER — EPHEDRINE SULFATE 50 MG/ML IJ SOLN
INTRAMUSCULAR | Status: DC | PRN
  Administered 2018-10-12 (×3): 10 mg via INTRAVENOUS

## 2018-10-12 MED ORDER — MIDAZOLAM HCL 2 MG/2ML IJ SOLN
INTRAMUSCULAR | Status: DC | PRN
  Administered 2018-10-12: 1 mg via INTRAVENOUS

## 2018-10-12 MED ORDER — PROPOFOL 10 MG/ML IV BOLUS
INTRAVENOUS | Status: AC
  Filled 2018-10-12: qty 40

## 2018-10-12 MED ORDER — MIDAZOLAM HCL 2 MG/2ML IJ SOLN
INTRAMUSCULAR | Status: AC
  Filled 2018-10-12: qty 2

## 2018-10-12 MED ORDER — DEXAMETHASONE SODIUM PHOSPHATE 10 MG/ML IJ SOLN
INTRAMUSCULAR | Status: DC | PRN
  Administered 2018-10-12: 5 mg via INTRAVENOUS

## 2018-10-12 MED ORDER — CEFAZOLIN SODIUM-DEXTROSE 2-4 GM/100ML-% IV SOLN
2.0000 g | Freq: Once | INTRAVENOUS | Status: AC
  Administered 2018-10-12: 2 g via INTRAVENOUS
  Filled 2018-10-12: qty 100

## 2018-10-12 MED ORDER — LIDOCAINE 2% (20 MG/ML) 5 ML SYRINGE
INTRAMUSCULAR | Status: AC
  Filled 2018-10-12: qty 5

## 2018-10-12 MED ORDER — PROPOFOL 10 MG/ML IV BOLUS
INTRAVENOUS | Status: DC | PRN
  Administered 2018-10-12: 180 mg via INTRAVENOUS

## 2018-10-12 MED ORDER — FENTANYL CITRATE (PF) 100 MCG/2ML IJ SOLN
INTRAMUSCULAR | Status: DC | PRN
  Administered 2018-10-12: 100 ug via INTRAVENOUS

## 2018-10-12 MED ORDER — LACTATED RINGERS IV SOLN
INTRAVENOUS | Status: DC
  Administered 2018-10-12 (×2): via INTRAVENOUS
  Filled 2018-10-12: qty 1000

## 2018-10-12 SURGICAL SUPPLY — 24 items
BAG DRAIN URO-CYSTO SKYTR STRL (DRAIN) ×2 IMPLANT
BAG DRN ANRFLXCHMBR STRAP LEK (BAG)
BAG DRN UROCATH (DRAIN) ×1
BAG URINE DRAINAGE (UROLOGICAL SUPPLIES) IMPLANT
BAG URINE LEG 19OZ MD ST LTX (BAG) IMPLANT
BAG URINE LEG 500ML (DRAIN) IMPLANT
CATH FOLEY 2WAY SLVR  5CC 16FR (CATHETERS)
CATH FOLEY 2WAY SLVR 5CC 16FR (CATHETERS) IMPLANT
CLOTH BEACON ORANGE TIMEOUT ST (SAFETY) ×2 IMPLANT
ELECT REM PT RETURN 9FT ADLT (ELECTROSURGICAL) ×2
ELECTRODE REM PT RTRN 9FT ADLT (ELECTROSURGICAL) ×1 IMPLANT
GLOVE BIO SURGEON STRL SZ 6.5 (GLOVE) ×1 IMPLANT
GLOVE BIO SURGEON STRL SZ7.5 (GLOVE) ×2 IMPLANT
GLOVE INDICATOR 6.5 STRL GRN (GLOVE) ×1 IMPLANT
GOWN STRL REUS W/ TWL XL LVL3 (GOWN DISPOSABLE) ×1 IMPLANT
GOWN STRL REUS W/TWL LRG LVL3 (GOWN DISPOSABLE) ×1 IMPLANT
GOWN STRL REUS W/TWL XL LVL3 (GOWN DISPOSABLE) ×2
KIT TURNOVER CYSTO (KITS) ×2 IMPLANT
MANIFOLD NEPTUNE II (INSTRUMENTS) ×1 IMPLANT
NEEDLE HYPO 22GX1.5 SAFETY (NEEDLE) IMPLANT
NS IRRIG 500ML POUR BTL (IV SOLUTION) IMPLANT
PACK CYSTO (CUSTOM PROCEDURE TRAY) ×2 IMPLANT
TUBE CONNECTING 12X1/4 (SUCTIONS) ×1 IMPLANT
WATER STERILE IRR 3000ML UROMA (IV SOLUTION) ×2 IMPLANT

## 2018-10-12 NOTE — Anesthesia Procedure Notes (Signed)
Procedure Name: LMA Insertion Date/Time: 10/12/2018 11:47 AM Performed by: Wanita Chamberlain, CRNA Pre-anesthesia Checklist: Patient identified, Emergency Drugs available, Suction available, Timeout performed and Patient being monitored Patient Re-evaluated:Patient Re-evaluated prior to induction Oxygen Delivery Method: Circle system utilized Preoxygenation: Pre-oxygenation with 100% oxygen Induction Type: IV induction Ventilation: Mask ventilation without difficulty LMA: LMA inserted Number of attempts: 1 Placement Confirmation: breath sounds checked- equal and bilateral,  CO2 detector and positive ETCO2 Tube secured with: Tape Dental Injury: Teeth and Oropharynx as per pre-operative assessment

## 2018-10-12 NOTE — Interval H&P Note (Signed)
History and Physical Interval Note:  10/12/2018 11:29 AM  Roger Durham  has presented today for surgery, with the diagnosis of BLADDER NEOPLASM  The various methods of treatment have been discussed with the patient and family. After consideration of risks, benefits and other options for treatment, the patient has consented to  Procedure(s): CYSTOSCOPY WITH BLADDER BIOPSY/ FULGURATION BIOPSY (N/A) as a surgical intervention .  He has been well.  No dysuria, fever cough or congestion.  I discussed with his wife, daughter who is pediatric hospitalist and son who is anesthesia.  The patient's history has been reviewed, patient examined, no change in status, stable for surgery.  I have reviewed the patient's chart and labs.  Questions were answered to the patient's satisfaction.  He elects to proceed.    Festus Aloe

## 2018-10-12 NOTE — Discharge Instructions (Addendum)
Cystoscopy  Cystoscopy is a procedure that is used to help diagnose and sometimes treat conditions that affect that lower urinary tract. The lower urinary tract includes the bladder and the tube that drains urine from the bladder out of the body (urethra). Cystoscopy is performed with a thin, tube-shaped instrument with a light and camera at the end (cystoscope). The cystoscope may be hard (rigid) or flexible, depending on the goal of the procedure.The cystoscope is inserted through the urethra, into the bladder. Cystoscopy may be recommended if you have:  Urinary tractinfections that keep coming back (recurring).  Blood in the urine (hematuria).  Loss of bladder control (urinary incontinence) or an overactive bladder.  Unusual cells found in a urine sample.  A blockage in the urethra.  Painful urination.  An abnormality in the bladder found during an intravenous pyelogram (IVP) or CT scan. Cystoscopy may also be done to remove a sample of tissue to be examined under a microscope (biopsy). Tell a health care provider about:  Any allergies you have.  All medicines you are taking, including vitamins, herbs, eye drops, creams, and over-the-counter medicines.  Any problems you or family members have had with anesthetic medicines.  Any blood disorders you have.  Any surgeries you have had.  Any medical conditions you have.  Whether you are pregnant or may be pregnant. What are the risks? Generally, this is a safe procedure. However, problems may occur, including:  Infection.  Bleeding.  Allergic reactions to medicines.  Damage to other structures or organs. What happens before the procedure?  Ask your health care provider about: ? Changing or stopping your regular medicines. This is especially important if you are taking diabetes medicines or blood thinners. ? Taking medicines such as aspirin and ibuprofen. These medicines can thin your blood. Do not take these medicines  before your procedure if your health care provider instructs you not to.  Follow instructions from your health care provider about eating or drinking restrictions.  You may be given antibiotic medicine to help prevent infection.  You may have an exam or testing, such as X-rays of the bladder, urethra, or kidneys.  You may have urine tests to check for signs of infection.  Plan to have someone take you home after the procedure. What happens during the procedure?  To reduce your risk of infection,your health care team will wash or sanitize their hands.  You will be given one or more of the following: ? A medicine to help you relax (sedative). ? A medicine to numb the area (local anesthetic).  The area around the opening of your urethra will be cleaned.  The cystoscope will be passed through your urethra into your bladder.  Germ-free (sterile)fluid will flow through the cystoscope to fill your bladder. The fluid will stretch your bladder so that your surgeon can clearly examine your bladder walls.  The cystoscope will be removed and your bladder will be emptied. The procedure may vary among health care providers and hospitals. What happens after the procedure?  You may have some soreness or pain in your abdomen and urethra. Medicines will be available to help you.  You may have some blood in your urine.  Do not drive for 24 hours if you received a sedative. This information is not intended to replace advice given to you by your health care provider. Make sure you discuss any questions you have with your health care provider. Document Released: 08/26/2000 Document Revised: 06/09/2017 Document Reviewed: 07/16/2015 Elsevier Interactive Patient  Education  2019 [place].  Bladder Biopsy, Care After Refer to this sheet in the next few weeks. These instructions provide you with information about caring for yourself after your procedure. Your health care provider may also give you  more specific instructions. Your treatment has been planned according to current medical practices, but problems sometimes occur. Call your health care provider if you have any problems or questions after your procedure. What can I expect after the procedure? After the procedure, it is common to have:  Mild pain in your bladder or kidney area during urination.  Minor burning during urination.  Small amounts of blood in your urine.  A sudden urge to urinate.  A need to urinate more often than usual. Follow these instructions at home: Medicines  Take over-the-counter and prescription medicines only as told by your health care provider.  If you were prescribed an antibiotic medicine, take it as told by your health care provider. Do not stop taking the antibiotic even if you start to feel better. General instructions   Take a warm bath to relieve any burning sensations around your urethra.  Hold a warm, damp washcloth over the urethral area to ease pain.  Return to your normal activities as told by your health care provider. Ask your health care provider what activities are safe for you.  Do not drive for 24 hours if you received a medicine to help you relax (sedative) during your procedure. Ask your health care provider when it is safe for you to drive.  It is your responsibility to get the results of your procedure. Ask your health care provider or the department performing the procedure when your results will be ready.  Keep all follow-up visits as told by your health care provider. This is important. Contact a health care provider if:  You have a fever.  Your symptoms do not improve and you continue to have: ? Burning during urination. ? Increasing amounts of blood in your urine. ? Pain during urination. ? An urgent need to urinate. ? A need to urinate more often than usual. Get help right away if:  You have a lot of bleeding or more bleeding.  You have severe  pain.  You are unable to urinate.  You have bright red blood in your urine.  You are passing blood clots in your urine.  You have a fever.  You have swelling, redness, or pain in your legs.  You have difficulty breathing. This information is not intended to replace advice given to you by your health care provider. Make sure you discuss any questions you have with your health care provider. Document Released: 09/15/2015 Document Revised: 02/04/2016 Document Reviewed: 09/15/2015 Elsevier Interactive Patient Education  2019 [place] [facility] Home Care Instructions  Activity: Get plenty of rest for the remainder of the day. A responsible adult should stay with you for 24 hours following the procedure.  For the next 24 hours, DO NOT: -Drive a car -Paediatric nurse -Drink alcoholic beverages -Take any medication unless instructed by your physician -Make any legal decisions or sign important papers.  Meals: Start with liquid foods such as gelatin or soup. Progress to regular foods as tolerated. Avoid greasy, spicy, heavy foods. If nausea and/or vomiting occur, drink only clear liquids until the nausea and/or vomiting subsides. Call your physician if vomiting continues.  Special Instructions/Symptoms: Your throat may feel dry or sore from the anesthesia or the breathing tube placed in your throat during  surgery. If this causes discomfort, gargle with warm salt water. The discomfort should disappear within 24 hours.

## 2018-10-12 NOTE — Transfer of Care (Signed)
Immediate Anesthesia Transfer of Care Note  Patient: Roger Durham  Procedure(s) Performed: CYSTOSCOPY WITH BLADDER BIOPSY/ FULGURATION BIOPSY 0.5 to 2 cm (N/A Urethra)  Patient Location: PACU  Anesthesia Type:General  Level of Consciousness: awake, alert , oriented and patient cooperative  Airway & Oxygen Therapy: Patient Spontanous Breathing and Patient connected to nasal cannula oxygen  Post-op Assessment: Report given to RN and Post -op Vital signs reviewed and stable  Post vital signs: Reviewed and stable  Last Vitals:  Vitals Value Taken Time  BP    Temp    Pulse    Resp    SpO2      Last Pain:  Vitals:   10/12/18 1016  TempSrc:   PainSc: 5       Patients Stated Pain Goal: 5 (58/83/25 4982)  Complications: No apparent anesthesia complications

## 2018-10-12 NOTE — Anesthesia Preprocedure Evaluation (Addendum)
Anesthesia Evaluation  Patient identified by MRN, date of birth, ID band Patient awake    Reviewed: Allergy & Precautions, NPO status , Patient's Chart, lab work & pertinent test results  History of Anesthesia Complications Negative for: history of anesthetic complications  Airway Mallampati: III  TM Distance: >3 FB Neck ROM: Full    Dental  (+) Lower Dentures, Upper Dentures   Pulmonary former smoker,    breath sounds clear to auscultation       Cardiovascular Exercise Tolerance: Good hypertension, Pt. on medications (-) angina Rhythm:Regular Rate:Normal     Neuro/Psych  Neuromuscular disease (lumbar radiculopathy) negative psych ROS   GI/Hepatic negative GI ROS,   Endo/Other  Hypothyroidism   Renal/GU negative Renal ROS    Prostate cancer s/p prostatectomy      Musculoskeletal negative musculoskeletal ROS (+)  Chronic back pain    Abdominal   Peds  Hematology negative hematology ROS (+)   Anesthesia Other Findings On methadone   Reproductive/Obstetrics                           Anesthesia Physical Anesthesia Plan  ASA: III  Anesthesia Plan: General   Post-op Pain Management:    Induction: Intravenous  PONV Risk Score and Plan: 2 and Treatment may vary due to age or medical condition and Ondansetron  Airway Management Planned: LMA  Additional Equipment: None  Intra-op Plan:   Post-operative Plan: Extubation in OR  Informed Consent: I have reviewed the patients History and Physical, chart, labs and discussed the procedure including the risks, benefits and alternatives for the proposed anesthesia with the patient or authorized representative who has indicated his/her understanding and acceptance.     Dental advisory given  Plan Discussed with: CRNA and Anesthesiologist  Anesthesia Plan Comments:        Anesthesia Quick Evaluation

## 2018-10-12 NOTE — Op Note (Signed)
Preoperative diagnosis: Gross hematuria, radiation cystitis/bladder erythema Postoperative diagnosis: Same  Procedure: Cystoscopy with bladder biopsy fulguration 0.5 to 2 cm  Surgeon: Junious Silk  Anesthesia: General  Indication for procedure: A 63 year old with gross hematuria.  He had undergone radiation in the past and appears he has some radiation cystitis.  Findings: On cystoscopy the urethra was normal.  The urethrovesical anastomosis appeared normal without foreign body or stone.  There was no stone or foreign body in the bladder.  The trigone and ureteral orifice ease were in the normal orthotopic position with clear reflux.  Posteriorly on the bladder there were scattered submucosal hemorrhages and telangiectatic areas primarily in a posterior distribution.  When the bladder was drained this area rested against the bladder neck and likely received some radiation during his salvage treatments.  I thought he had a reduced bladder capacity of about 250 ml.   Description of procedure: After consent was obtained the patient brought to the operating room.  After adequate and stages placed in lithotomy position and prepped and draped in usual sterile fashion.  A timeout was performed to confirm the patient and procedure.  The cystoscope was passed per urethra and the bladder carefully inspected.  I examined the bladder with a 30 degree and 70 degree lens.  I then biopsied the posterior mucosal changes and fulgurated only the most severe hemorrhagic changes.  There is a cluster of 3 areas about 2 cm each spread along the back of the bladder.  There were no other findings and hemostasis was excellent at low pressure.  The scope was removed and lidocaine jelly instilled per urethra.  He was awakened and taken to the recovery room in stable condition.  Complications: None  Blood loss: Minimal  Specimens to pathology: Posterior bladder biopsy x2  Drains: None  Disposition: Patient stable to PACU

## 2018-10-15 ENCOUNTER — Encounter (HOSPITAL_BASED_OUTPATIENT_CLINIC_OR_DEPARTMENT_OTHER): Payer: Self-pay | Admitting: Urology

## 2018-10-15 NOTE — Anesthesia Postprocedure Evaluation (Signed)
Anesthesia Post Note  Patient: Roger Durham  Procedure(s) Performed: CYSTOSCOPY WITH BLADDER BIOPSY/ FULGURATION BIOPSY 0.5 to 2 cm (N/A Urethra)     Patient location during evaluation: PACU Anesthesia Type: General Level of consciousness: awake and alert Pain management: pain level controlled Vital Signs Assessment: post-procedure vital signs reviewed and stable Respiratory status: spontaneous breathing, nonlabored ventilation and respiratory function stable Cardiovascular status: blood pressure returned to baseline and stable Postop Assessment: no apparent nausea or vomiting Anesthetic complications: no    Last Vitals:  Vitals:   10/12/18 1330 10/12/18 1500  BP: 104/65 125/85  Pulse: 73 73  Resp: 12 14  Temp:  (!) 36.1 C  SpO2: 94% 97%    Last Pain:  Vitals:   10/12/18 1500  TempSrc:   PainSc: 0-No pain                 Audry Pili

## 2018-10-22 MED FILL — LEVOTHYROXINE 100 MCG TABLE: 100 | 90 days supply | Qty: 90 | Fill #1

## 2018-11-05 MED FILL — traZODone HCL 50 MG TABS: 50 | 30 days supply | Qty: 30 | Fill #0

## 2018-11-06 MED FILL — METHADONE HCL 10 MG TABS: 10 | 30 days supply | Qty: 90 | Fill #0

## 2018-11-08 MED FILL — OXYBUTYNIN CL ER 10 MG TAB: 10 | 30 days supply | Qty: 30 | Fill #0

## 2018-11-08 MED FILL — PEG-3350 SOLUTION: 420 | 1 days supply | Qty: 4000 | Fill #0

## 2018-11-16 MED FILL — LEVOTHYROXINE 112 MCG TAB: 112 | 90 days supply | Qty: 90 | Fill #0

## 2018-11-22 MED FILL — AMLODIPINE-BENAZEPRIL 5-10: 5-10 | 90 days supply | Qty: 90 | Fill #1

## 2018-11-28 MED FILL — ATORVASTATIN 20 MG TABLET: 20 | 90 days supply | Qty: 90 | Fill #1

## 2018-11-28 MED FILL — traZODone HCL 50 MG TABS: 50 | 30 days supply | Qty: 30 | Fill #1 | Status: TO

## 2018-12-07 MED FILL — METHADONE HCL 10 MG TABLET: 10 | 30 days supply | Qty: 90 | Fill #0

## 2019-01-08 MED FILL — traZODone HCL 50 MG TABS: 50 | 30 days supply | Qty: 30 | Fill #0 | Status: TO

## 2019-01-09 MED FILL — METHADONE HCL 10 MG TABLET: 10 | 30 days supply | Qty: 90 | Fill #0

## 2019-01-31 MED FILL — QVAR REDIHALER 80 MCG/ACT A: 80 | 60 days supply | Qty: 11 | Fill #0

## 2019-01-31 MED FILL — ALBUTEROL SULFATE HFA 108 (: 108 (90 BAS | 17 days supply | Qty: 18 | Fill #0

## 2019-02-07 MED FILL — traZODone HCL 50 MG TABS: 50 | 30 days supply | Qty: 30 | Fill #0

## 2019-02-15 MED FILL — METHADONE HCL 10 MG TABLET: 10 | 30 days supply | Qty: 90 | Fill #0

## 2019-02-18 MED FILL — AMLODIPINE-BENAZEPRIL 5-10: 5-10 | 90 days supply | Qty: 90 | Fill #2

## 2019-03-04 MED FILL — EPINEPHRINE 0.3 MG AUTO-INJ: 0.3 | 30 days supply | Qty: 2 | Fill #1

## 2019-03-11 MED FILL — ATORVASTATIN 20 MG TABLET: 20 | 90 days supply | Qty: 90 | Fill #2

## 2019-03-11 MED FILL — traZODone HCL 50 MG TABS: 50 | 30 days supply | Qty: 30 | Fill #1

## 2019-03-19 MED FILL — METHADONE HCL 10 MG TABLET: 10 | 30 days supply | Qty: 90 | Fill #0

## 2019-03-19 MED FILL — LEVOTHYROXINE 112 MCG TAB: 112 | 90 days supply | Qty: 90 | Fill #1

## 2019-04-08 MED FILL — traZODone HCL 50 MG TABS: 50 | 30 days supply | Qty: 30 | Fill #2

## 2019-04-17 MED FILL — CEFUROXIME AXETIL 500 MG TA: 500 | 5 days supply | Qty: 10 | Fill #0

## 2019-04-19 ENCOUNTER — Other Ambulatory Visit: Payer: Self-pay | Admitting: Internal Medicine

## 2019-04-19 ENCOUNTER — Ambulatory Visit
Admission: RE | Admit: 2019-04-19 | Discharge: 2019-04-19 | Disposition: A | Discharge status: Home / Self Care | Payer: No Typology Code available for payment source | Source: Ambulatory Visit | Attending: Internal Medicine | Admitting: Internal Medicine

## 2019-04-19 DIAGNOSIS — R059 Cough, unspecified: Secondary | ICD-10-CM

## 2019-04-19 DIAGNOSIS — R05 Cough: Secondary | ICD-10-CM

## 2019-04-19 MED FILL — DOXYCYCLINE HYCLATE 100 MG: 100 | 7 days supply | Qty: 14 | Fill #0

## 2019-04-21 ENCOUNTER — Encounter (HOSPITAL_COMMUNITY): Payer: Self-pay | Admitting: Emergency Medicine

## 2019-04-21 ENCOUNTER — Inpatient Hospital Stay (HOSPITAL_COMMUNITY)
Admission: EM | Admit: 2019-04-21 | Discharge: 2019-04-27 | DRG: 196 | Disposition: A | Discharge status: Home / Self Care | Payer: No Typology Code available for payment source | Attending: Internal Medicine | Admitting: Internal Medicine

## 2019-04-21 ENCOUNTER — Emergency Department (HOSPITAL_COMMUNITY): Payer: No Typology Code available for payment source

## 2019-04-21 ENCOUNTER — Other Ambulatory Visit: Payer: Self-pay

## 2019-04-21 DIAGNOSIS — J679 Hypersensitivity pneumonitis due to unspecified organic dust: Principal | ICD-10-CM | POA: Diagnosis present

## 2019-04-21 DIAGNOSIS — J189 Pneumonia, unspecified organism: Secondary | ICD-10-CM | POA: Diagnosis not present

## 2019-04-21 DIAGNOSIS — J440 Chronic obstructive pulmonary disease with acute lower respiratory infection: Secondary | ICD-10-CM | POA: Diagnosis present

## 2019-04-21 DIAGNOSIS — E039 Hypothyroidism, unspecified: Secondary | ICD-10-CM | POA: Diagnosis present

## 2019-04-21 DIAGNOSIS — R0902 Hypoxemia: Secondary | ICD-10-CM

## 2019-04-21 DIAGNOSIS — G894 Chronic pain syndrome: Secondary | ICD-10-CM | POA: Diagnosis present

## 2019-04-21 DIAGNOSIS — Z79891 Long term (current) use of opiate analgesic: Secondary | ICD-10-CM | POA: Diagnosis not present

## 2019-04-21 DIAGNOSIS — R911 Solitary pulmonary nodule: Secondary | ICD-10-CM | POA: Diagnosis present

## 2019-04-21 DIAGNOSIS — Z79890 Hormone replacement therapy (postmenopausal): Secondary | ICD-10-CM | POA: Diagnosis not present

## 2019-04-21 DIAGNOSIS — Z809 Family history of malignant neoplasm, unspecified: Secondary | ICD-10-CM

## 2019-04-21 DIAGNOSIS — M549 Dorsalgia, unspecified: Secondary | ICD-10-CM | POA: Diagnosis present

## 2019-04-21 DIAGNOSIS — Z800 Family history of malignant neoplasm of digestive organs: Secondary | ICD-10-CM | POA: Diagnosis not present

## 2019-04-21 DIAGNOSIS — Z8546 Personal history of malignant neoplasm of prostate: Secondary | ICD-10-CM

## 2019-04-21 DIAGNOSIS — J188 Other pneumonia, unspecified organism: Secondary | ICD-10-CM | POA: Diagnosis present

## 2019-04-21 DIAGNOSIS — Z7982 Long term (current) use of aspirin: Secondary | ICD-10-CM

## 2019-04-21 DIAGNOSIS — R918 Other nonspecific abnormal finding of lung field: Secondary | ICD-10-CM | POA: Diagnosis not present

## 2019-04-21 DIAGNOSIS — Z87891 Personal history of nicotine dependence: Secondary | ICD-10-CM | POA: Diagnosis not present

## 2019-04-21 DIAGNOSIS — J9601 Acute respiratory failure with hypoxia: Secondary | ICD-10-CM | POA: Diagnosis not present

## 2019-04-21 DIAGNOSIS — I10 Essential (primary) hypertension: Secondary | ICD-10-CM | POA: Diagnosis not present

## 2019-04-21 DIAGNOSIS — R0602 Shortness of breath: Secondary | ICD-10-CM | POA: Diagnosis present

## 2019-04-21 DIAGNOSIS — Z880 Allergy status to penicillin: Secondary | ICD-10-CM | POA: Diagnosis not present

## 2019-04-21 DIAGNOSIS — Z20828 Contact with and (suspected) exposure to other viral communicable diseases: Secondary | ICD-10-CM | POA: Diagnosis present

## 2019-04-21 DIAGNOSIS — E785 Hyperlipidemia, unspecified: Secondary | ICD-10-CM | POA: Diagnosis present

## 2019-04-21 LAB — CBC WITH DIFFERENTIAL/PLATELET
Abs Immature Granulocytes: 0.03 10*3/uL (ref 0.00–0.07)
Basophils Absolute: 0.1 10*3/uL (ref 0.0–0.1)
Basophils Relative: 1 %
Eosinophils Absolute: 1.1 10*3/uL — ABNORMAL HIGH (ref 0.0–0.5)
Eosinophils Relative: 9 %
HCT: 37.5 % — ABNORMAL LOW (ref 39.0–52.0)
Hemoglobin: 12.1 g/dL — ABNORMAL LOW (ref 13.0–17.0)
Immature Granulocytes: 0 %
Lymphocytes Relative: 12 %
Lymphs Abs: 1.4 10*3/uL (ref 0.7–4.0)
MCH: 28 pg (ref 26.0–34.0)
MCHC: 32.3 g/dL (ref 30.0–36.0)
MCV: 86.8 fL (ref 80.0–100.0)
Monocytes Absolute: 1.1 10*3/uL — ABNORMAL HIGH (ref 0.1–1.0)
Monocytes Relative: 9 %
Neutro Abs: 8.3 10*3/uL — ABNORMAL HIGH (ref 1.7–7.7)
Neutrophils Relative %: 69 %
Platelets: 372 10*3/uL (ref 150–400)
RBC: 4.32 MIL/uL (ref 4.22–5.81)
RDW: 14.2 % (ref 11.5–15.5)
WBC: 11.9 10*3/uL — ABNORMAL HIGH (ref 4.0–10.5)
nRBC: 0 % (ref 0.0–0.2)

## 2019-04-21 LAB — COMPREHENSIVE METABOLIC PANEL
ALT: 31 U/L (ref 0–44)
AST: 26 U/L (ref 15–41)
Albumin: 2.8 g/dL — ABNORMAL LOW (ref 3.5–5.0)
Alkaline Phosphatase: 83 U/L (ref 38–126)
Anion gap: 12 (ref 5–15)
BUN: 16 mg/dL (ref 8–23)
CO2: 26 mmol/L (ref 22–32)
Calcium: 8.5 mg/dL — ABNORMAL LOW (ref 8.9–10.3)
Chloride: 97 mmol/L — ABNORMAL LOW (ref 98–111)
Creatinine, Ser: 0.7 mg/dL (ref 0.61–1.24)
GFR calc Af Amer: >60 mL/min (ref >60)
GFR calc non Af Amer: >60 mL/min (ref >60)
Glucose, Bld: 118 mg/dL — ABNORMAL HIGH (ref 70–99)
Potassium: 4.2 mmol/L (ref 3.5–5.1)
Sodium: 135 mmol/L (ref 135–145)
Total Bilirubin: 0.5 mg/dL (ref 0.3–1.2)
Total Protein: 6.8 g/dL (ref 6.5–8.1)

## 2019-04-21 LAB — LACTIC ACID, PLASMA
Lactic Acid, Venous: 1.2 mmol/L (ref 0.5–1.9)
Lactic Acid, Venous: 1 mmol/L (ref 0.5–1.9)

## 2019-04-21 LAB — SARS CORONAVIRUS 2 BY RT PCR (HOSPITAL ORDER, PERFORMED IN ~~LOC~~ HOSPITAL LAB): SARS Coronavirus 2: NEGATIVE

## 2019-04-21 MED ORDER — HYDRALAZINE HCL 20 MG/ML IJ SOLN: 5.0000 mg | INTRAMUSCULAR | Status: DC | PRN

## 2019-04-21 MED ORDER — SODIUM CHLORIDE 0.9 % IV SOLN
2.0000 g | INTRAVENOUS | Status: DC
  Administered 2019-04-21: 2 g via INTRAVENOUS
  Filled 2019-04-21 (×2): qty 20

## 2019-04-21 MED ORDER — ACETAMINOPHEN 500 MG PO TABS
1000.0000 mg | ORAL_TABLET | Freq: Once | ORAL | Status: AC
  Administered 2019-04-21: 1000 mg via ORAL
  Filled 2019-04-21: qty 2

## 2019-04-21 MED ORDER — METRONIDAZOLE 500 MG PO TABS
500.0000 mg | ORAL_TABLET | Freq: Three times a day (TID) | ORAL | Status: DC
  Administered 2019-04-22 (×2): 500 mg via ORAL
  Filled 2019-04-21 (×2): qty 1

## 2019-04-21 MED ORDER — SODIUM CHLORIDE 0.9 % IV SOLN
500.0000 mg | Freq: Once | INTRAVENOUS | Status: AC
  Administered 2019-04-21: 500 mg via INTRAVENOUS
  Filled 2019-04-21: qty 500

## 2019-04-21 MED ORDER — SODIUM CHLORIDE 0.9% FLUSH
3.0000 mL | Freq: Once | INTRAVENOUS | Status: AC
  Administered 2019-04-21: 3 mL via INTRAVENOUS

## 2019-04-21 MED ORDER — SODIUM CHLORIDE 0.9 % IV SOLN
500.0000 mg | INTRAVENOUS | Status: DC
  Filled 2019-04-21: qty 500

## 2019-04-21 NOTE — ED Triage Notes (Signed)
Pt reports that he has been taking abx for a sinus infection and pneumonia, finished first course of abx and started on doxycycline today. States he has had no improvement and feels worse. Tested neg for COVID. Shortness of breath worse with exertion. Denies chest pain.

## 2019-04-21 NOTE — ED Provider Notes (Signed)
[facility] EMERGENCY DEPARTMENT Provider Note   CSN: [id number] Arrival date & time: 04/21/19  1850    History   Chief Complaint Chief Complaint  Patient presents with  . Shortness of Breath    HPI Roger Durham is a 63 y.o. male who presents with 9-day history of sinus pain, sinus pressure, cough, productive sputum, shortness of breath.  Patient states his symptoms began gradually and slowly got worse over the 9 days.  He saw his PCP on 04/15/2019 he thought he had a pneumonia start him on "a cephalosporin".  Patient states he completed a 5-day course of the antibiotic and did not have any improvement.  He saw his PCP on 04/19/2019 he did not feel he had any clinical improvement.  He ordered a chest x-ray which showed multifocal pneumonia and switch him to doxycycline.  Patient has been taking for 2 days at this point.  He also underwent COVID swab by his PCP which was negative.  Patient states that he became very short of breath when attempting to tie his shoes, at that point utilize needed to come to the emergency department.  Of note patient states that when he was gardening roughly 2 days prior to start of his symptoms, he was exposed to a white powdery substance from a bucket of leaves he was cleaning out.  He is unsure if these are related to his symptoms.   Past Medical History:  Diagnosis Date  . Cancer ([place])    bx. 2'14- dx. Prostate cancer.-surgery planned  . Chronic back pain    lumbar 3.4.5 level -tx. pain clinic-Dr. R. Rauch-[facility]  . Hematuria    off and on  . Hypertension   . Hypothyroidism    tx. Levothyroxine  . Neuromuscular disorder ([place])    due to back pain issues-left leg"cramping, burning,sharp"  . Prostate cancer ([place])   . Seasonal allergies    environmental allergies    Patient Active Problem List   Diagnosis Date Noted  . Multifocal pneumonia 04/21/2019  . HTN (hypertension) 04/21/2019  . HLD (hyperlipidemia)  04/21/2019  . Acute respiratory failure with hypoxia ([place]) 04/21/2019  . Hypothyroidism 10/09/2015  . Prostate cancer ([place]) 10/01/2015    Past Surgical History:  Procedure Laterality Date  . APPENDECTOMY  1982  . BACK SURGERY     multiple back surgery-retained hardware '08-Ray cage  . CYSTOSCOPY WITH BIOPSY N/A 10/12/2018   Procedure: CYSTOSCOPY WITH BLADDER BIOPSY/ FULGURATION BIOPSY 0.5 to 2 cm;  Surgeon: Festus Aloe, MD;  Location: [facility];  Service: Urology;  Laterality: N/A;  . KNEE ARTHROSCOPY  1980's   left knee-torn menicus  . LYMPHADENECTOMY Bilateral 11/21/2012   Procedure: LYMPHADENECTOMY;  Surgeon: Bernestine Amass, MD;  Location: [facility];  Service: Urology;  Laterality: Bilateral;  . NASAL SEPTOPLASTY W/ TURBINOPLASTY  1990's   Seasonal allergies  . ROBOT ASSISTED LAPAROSCOPIC RADICAL PROSTATECTOMY N/A 11/21/2012   Procedure: ROBOTIC ASSISTED LAPAROSCOPIC RADICAL PROSTATECTOMY;  Surgeon: Bernestine Amass, MD;  Location: [facility];  Service: Urology;  Laterality: N/A;  WITH BILATERAL PELVIC LYMPH NODE DISSECTION        Home Medications    Prior to Admission medications   Medication Sig Start Date End Date Taking? Authorizing Provider  acetaminophen (TYLENOL) 500 MG tablet Take 1,000 mg by mouth every 6 (six) hours as needed for pain.   Yes [provider]  amLODipine-benazepril (LOTREL) 5-10 MG per capsule Take 1 capsule by mouth every  morning.   Yes [provider]  aspirin 81 MG tablet Take 1 tablet (81 mg total) by mouth daily. 10/17/18  Yes Festus Aloe, MD  atorvastatin (LIPITOR) 10 MG tablet Take 10 mg by mouth at bedtime.   Yes [provider]  beclomethasone (QVAR) 80 MCG/ACT inhaler Inhale 1 puff into the lungs as needed. 01/22/16  Yes [provider]  diphenhydrAMINE (BENADRYL) 25 mg capsule Take 25 mg by mouth as needed for allergies.   Yes [provider]  EPINEPHrine 0.3 mg/0.3 mL IJ SOAJ  injection Inject 0.3 mg into the muscle as needed for anaphylaxis or allergies. 08/01/16  Yes [provider]  levothyroxine (SYNTHROID) 112 MCG tablet Take 112 mcg by mouth daily before breakfast.    Yes [provider]  methadone (DOLOPHINE) 10 MG tablet Take 10 mg by mouth 3 (three) times daily. For pain by pain management center   Yes [provider]    Family History Family History  Problem Relation Age of Onset  . Cancer Brother        liver    Social History Social History   Tobacco Use  . Smoking status: Former Smoker    Packs/day: 0.50    Years: 25.00    Pack years: 12.50    Types: Cigarettes    Quit date: 09/12/2009    Years since quitting: 9.6  . Smokeless tobacco: Never Used  . Tobacco comment: uses in past yr-electronic cigarette  Substance Use Topics  . Alcohol use: Not Currently  . Drug use: No    Allergies   Penicillins   Review of Systems Review of Systems  Constitutional: Positive for activity change, chills, fatigue and fever.  HENT: Positive for sinus pressure and sinus pain.   Respiratory: Positive for cough, chest tightness and shortness of breath.   Cardiovascular: Negative for chest pain and palpitations.  Gastrointestinal: Negative for abdominal distention, abdominal pain, diarrhea, nausea and vomiting.  Musculoskeletal: Negative for arthralgias.  Psychiatric/Behavioral: Negative for agitation.    Physical Exam Updated Vital Signs BP (!) 146/77   Pulse 77   Temp 99.5 F (37.5 C) (Oral)   Resp 17   SpO2 93%   Physical Exam Constitutional:      General: He is not in acute distress.    Appearance: He is well-developed. He is not toxic-appearing.  Pulmonary:     Effort: Pulmonary effort is normal. No tachypnea or respiratory distress.     Breath sounds: No wheezing.     Comments: Coarse rhonchi noted all lung fields right lung.  coarse rhonchi noted left lower lung. Chest:     Chest wall: No mass, deformity  or tenderness.  Abdominal:     Palpations: Abdomen is soft. There is no mass.     Tenderness: There is no guarding.  Musculoskeletal: Normal range of motion.     Right lower leg: No edema.     Left lower leg: No edema.  Skin:    General: Skin is warm.     Capillary Refill: Capillary refill takes less than 2 seconds.     Coloration: Skin is not cyanotic.  Neurological:     General: No focal deficit present.     Mental Status: He is alert. He is disoriented.     Cranial Nerves: No cranial nerve deficit.      ED Treatments / Results  Labs (all labs ordered are listed, but only abnormal results are displayed) Labs Reviewed  COMPREHENSIVE METABOLIC PANEL -  Abnormal; Notable for the following components:      Result Value   Chloride 97 (*)    Glucose, Bld 118 (*)    Calcium 8.5 (*)    Albumin 2.8 (*)    All other components within normal limits  CBC WITH DIFFERENTIAL/PLATELET - Abnormal; Notable for the following components:   WBC 11.9 (*)    Hemoglobin 12.1 (*)    HCT 37.5 (*)    Neutro Abs 8.3 (*)    Monocytes Absolute 1.1 (*)    Eosinophils Absolute 1.1 (*)    All other components within normal limits  SARS CORONAVIRUS 2 (HOSPITAL ORDER, [facility])  CULTURE, BLOOD (ROUTINE X 2)  CULTURE, BLOOD (ROUTINE X 2)  LACTIC ACID, PLASMA  LACTIC ACID, PLASMA    EKG EKG Interpretation  Date/Time:  Sunday April 21 2019 19:11:33 EDT Ventricular Rate:  94 PR Interval:  146 QRS Duration: 80 QT Interval:  320 QTC Calculation: 400 R Axis:   90 Text Interpretation:  Sinus rhythm with occasional Premature ventricular complexes Possible Left atrial enlargement Rightward axis Borderline ECG Confirmed by Quintella Reichert [phone]) on 04/21/2019 7:54:16 PM   Radiology Dg Chest 2 View  Result Date: 04/21/2019 CLINICAL DATA:  Sinus infection and pneumonia. Shortness of breath. EXAM: CHEST - 2 VIEW COMPARISON:  April 19, 2019 FINDINGS: Cardiomediastinal  silhouette is normal. Mediastinal contours appear intact. Persistent multifocal areas of airspace consolidation throughout the right lung, worse in the right upper lobe. Osseous structures are without acute abnormality. Soft tissues are grossly normal. IMPRESSION: Persistent multifocal areas of airspace consolidation throughout the right lung, worse in the right upper lobe. Electronically Signed   By: Fidela Salisbury M.D.   On: 04/21/2019 20:03    Procedures Procedures (including critical care time)  Medications Ordered in ED Medications  sodium chloride flush (NS) 0.9 % injection 3 mL (has no administration in time range)  cefTRIAXone (ROCEPHIN) 2 g in sodium chloride 0.9 % 100 mL IVPB (2 g Intravenous New Bag/Given 04/21/19 2103)  azithromycin (ZITHROMAX) 500 mg in sodium chloride 0.9 % 250 mL IVPB (has no administration in time range)  hydrALAZINE (APRESOLINE) injection 5 mg (has no administration in time range)  azithromycin (ZITHROMAX) 500 mg in sodium chloride 0.9 % 250 mL IVPB (has no administration in time range)  acetaminophen (TYLENOL) tablet 1,000 mg (1,000 mg Oral Given 04/21/19 2059)     Initial Impression / Assessment and Plan / ED Course  I have reviewed the triage vital signs and the nursing notes.  Pertinent labs & imaging results that were available during my care of the patient were reviewed by me and considered in my medical decision making (see chart for details).  63 year old male who presents for 7-day history of progressive sinus pain, sinus pressure, shortness of breath, cough with productive sputum.  Failed 5-day course of an unknown cephalosporin, and has not improved on 2 days of doxycycline.  Reassuring that initial lactic acid was 1.2, repeat pending.  Afebrile since presentation but has been taking Tylenol.  Patient did desat down to 90% when leaning forward for lung exam.  Given that he is now received 2 different antibiotics without any clinical improvement,  desatted in room, has worsening chest x-ray will recommend admission for IV antibiotics.  Discussed with Dr. Blaine Hamper from [facility] hospitalist.  Agree to admit patient.  Will wait for cover test results chances positive.   Final Clinical Impressions(s) / ED Diagnoses   Final diagnoses:  Multifocal pneumonia    ED Discharge Orders    None    Guadalupe Dawn MD PGY-3 Family Medicine Resident   Guadalupe Dawn, MD 04/21/19 2216    Quintella Reichert, MD 04/24/19 1113

## 2019-04-21 NOTE — ED Notes (Signed)
ED Provider at bedside. 

## 2019-04-21 NOTE — ED Notes (Signed)
ED TO INPATIENT HANDOFF REPORT  ED Nurse Name and Phone #:  Patty [phone]  S Name/Age/Gender Westley Foots 63 y.o. male Room/Bed: 036C/036C  Code Status   Code Status: Not on file  Home/SNF/Other Home Patient oriented to: self, place, time and situation Is this baseline? Yes   Triage Complete: Triage complete  Chief Complaint resp distress  Triage Note Pt reports that he has been taking abx for a sinus infection and pneumonia, finished first course of abx and started on doxycycline today. States he has had no improvement and feels worse. Tested neg for COVID. Shortness of breath worse with exertion. Denies chest pain.   Allergies Allergies  Allergen Reactions  . Penicillins Other (See Comments)    Tingling in mouth. Intolerance.    Level of Care/Admitting Diagnosis ED Disposition    ED Disposition Condition Comment   Admit  Hospital Area: [facility] [[id number]]  Level of Care: Telemetry Medical [104]  Covid Evaluation: Confirmed COVID Negative  Diagnosis: Multifocal pneumonia [[id number]]  Admitting Physician: Ivor Costa [[id number]]  Attending Physician: Ivor Costa [phone]  Estimated length of stay: past midnight tomorrow  Certification:: I certify this patient will need inpatient services for at least 2 midnights  PT Class (Do Not Modify): Inpatient [101]  PT Acc Code (Do Not Modify): Private [1]       B Medical/Surgery History Past Medical History:  Diagnosis Date  . Cancer ([place])    bx. 2'14- dx. Prostate cancer.-surgery planned  . Chronic back pain    lumbar 3.4.5 level -tx. pain clinic-Dr. R. Rauch-[facility] -[place]  . Hematuria    off and on  . Hypertension   . Hypothyroidism    tx. Levothyroxine  . Neuromuscular disorder ([place])    due to back pain issues-left leg"cramping, burning,sharp"  . Prostate cancer ([place])   . Seasonal allergies    environmental allergies   Past Surgical History:  Procedure Laterality Date  .  APPENDECTOMY  1982  . BACK SURGERY     multiple back surgery-retained hardware '08-Ray cage  . CYSTOSCOPY WITH BIOPSY N/A 10/12/2018   Procedure: CYSTOSCOPY WITH BLADDER BIOPSY/ FULGURATION BIOPSY 0.5 to 2 cm;  Surgeon: Festus Aloe, MD;  Location: [facility];  Service: Urology;  Laterality: N/A;  . KNEE ARTHROSCOPY  1980's   left knee-torn menicus  . LYMPHADENECTOMY Bilateral 11/21/2012   Procedure: LYMPHADENECTOMY;  Surgeon: Bernestine Amass, MD;  Location: [facility];  Service: Urology;  Laterality: Bilateral;  . NASAL SEPTOPLASTY W/ TURBINOPLASTY  1990's   Seasonal allergies  . ROBOT ASSISTED LAPAROSCOPIC RADICAL PROSTATECTOMY N/A 11/21/2012   Procedure: ROBOTIC ASSISTED LAPAROSCOPIC RADICAL PROSTATECTOMY;  Surgeon: Bernestine Amass, MD;  Location: [facility];  Service: Urology;  Laterality: N/A;  WITH BILATERAL PELVIC LYMPH NODE DISSECTION      A IV Location/Drains/Wounds Patient Lines/Drains/Airways Status   Active Line/Drains/Airways    Name:   Placement date:   Placement time:   Site:   Days:   Peripheral IV 11/21/12 Left Hand   11/21/12    0820    Hand   2342   Peripheral IV 04/21/19 Right Antecubital   04/21/19    2102    Antecubital   less than 1   Closed System Drain 1 LLQ Bulb (JP) 19 Fr.   11/21/12    1100    LLQ   2342   Urethral Catheter Coude 18 Fr.   11/21/12    1055    Coude  2342   Airway   10/12/18    1147     191   Incision (Closed) 10/12/18 Perineum Other (Comment)   10/12/18    1205     191   Incision - 6 Ports Abdomen 1: Left;Lower;Lateral 2: Left;Lateral;Upper 3: Superior;Umbilicus 4: Right;Lateral;Lower 5: Right;Lateral;Medial;Upper 6: Right;Upper;Lateral   11/21/12    0912     2342          Intake/Output Last 24 hours  Intake/Output Summary (Last 24 hours) at 04/21/2019 2250 Last data filed at 04/21/2019 2133 Gross per 24 hour  Intake 100 ml  Output -  Net 100 ml    Labs/Imaging Results for orders placed or performed during the hospital  encounter of 04/21/19 (from the past 48 hour(s))  Lactic acid, plasma     Status: None   Collection Time: 04/21/19  7:17 PM  Result Value Ref Range   Lactic Acid, Venous 1.2 0.5 - 1.9 mmol/L    Comment: Performed at [facility], [address] [place], [place] [address]  Comprehensive metabolic panel     Status: Abnormal   Collection Time: 04/21/19  7:17 PM  Result Value Ref Range   Sodium 135 135 - 145 mmol/L   Potassium 4.2 3.5 - 5.1 mmol/L   Chloride 97 (L) 98 - 111 mmol/L   CO2 26 22 - 32 mmol/L   Glucose, Bld 118 (H) 70 - 99 mg/dL   BUN 16 8 - 23 mg/dL   Creatinine, Ser 0.70 0.61 - 1.24 mg/dL   Calcium 8.5 (L) 8.9 - 10.3 mg/dL   Total Protein 6.8 6.5 - 8.1 g/dL   Albumin 2.8 (L) 3.5 - 5.0 g/dL   AST 26 15 - 41 U/L   ALT 31 0 - 44 U/L   Alkaline Phosphatase 83 38 - 126 U/L   Total Bilirubin 0.5 0.3 - 1.2 mg/dL   GFR calc non Af Amer >60 >60 mL/min   GFR calc Af Amer >60 >60 mL/min   Anion gap 12 5 - 15    Comment: Performed at [facility] [address] [place], [place] [address]  CBC with Differential     Status: Abnormal   Collection Time: 04/21/19  7:17 PM  Result Value Ref Range   WBC 11.9 (H) 4.0 - 10.5 K/uL   RBC 4.32 4.22 - 5.81 MIL/uL   Hemoglobin 12.1 (L) 13.0 - 17.0 g/dL   HCT 37.5 (L) 39.0 - 52.0 %   MCV 86.8 80.0 - 100.0 fL   MCH 28.0 26.0 - 34.0 pg   MCHC 32.3 30.0 - 36.0 g/dL   RDW 14.2 11.5 - 15.5 %   Platelets 372 150 - 400 K/uL   nRBC 0.0 0.0 - 0.2 %   Neutrophils Relative % 69 %   Neutro Abs 8.3 (H) 1.7 - 7.7 K/uL   Lymphocytes Relative 12 %   Lymphs Abs 1.4 0.7 - 4.0 K/uL   Monocytes Relative 9 %   Monocytes Absolute 1.1 (H) 0.1 - 1.0 K/uL   Eosinophils Relative 9 %   Eosinophils Absolute 1.1 (H) 0.0 - 0.5 K/uL   Basophils Relative 1 %   Basophils Absolute 0.1 0.0 - 0.1 K/uL   Immature Granulocytes 0 %   Abs Immature Granulocytes 0.03 0.00 - 0.07 K/uL    Comment: Performed at [facility], [place] [address],  [place], [place] [address]  Lactic acid, plasma     Status: None  Collection Time: 04/21/19  8:58 PM  Result Value Ref Range   Lactic Acid, Venous 1.0 0.5 - 1.9 mmol/L    Comment: Performed at [facility], [place] [address], [place], [place] [address]  SARS Coronavirus 2 [facility] order, Performed in [facility] hospital lab) Nasopharyngeal Nasopharyngeal Swab     Status: None   Collection Time: 04/21/19  8:58 PM   Specimen: Nasopharyngeal Swab  Result Value Ref Range   SARS Coronavirus 2 NEGATIVE NEGATIVE    Comment: (NOTE) If result is NEGATIVE SARS-CoV-2 target nucleic acids are NOT DETECTED. The SARS-CoV-2 RNA is generally detectable in upper and lower  respiratory specimens during the acute phase of infection. The lowest  concentration of SARS-CoV-2 viral copies this assay can detect is 250  copies / mL. A negative result does not preclude SARS-CoV-2 infection  and should not be used as the sole basis for treatment or other  patient management decisions.  A negative result may occur with  improper specimen collection / handling, submission of specimen other  than nasopharyngeal swab, presence of viral mutation(s) within the  areas targeted by this assay, and inadequate number of viral copies  (<250 copies / mL). A negative result must be combined with clinical  observations, patient history, and epidemiological information. If result is POSITIVE SARS-CoV-2 target nucleic acids are DETECTED. The SARS-CoV-2 RNA is generally detectable in upper and lower  respiratory specimens dur ing the acute phase of infection.  Positive  results are indicative of active infection with SARS-CoV-2.  Clinical  correlation with patient history and other diagnostic information is  necessary to determine patient infection status.  Positive results do  not rule out bacterial infection or co-infection with other viruses. If result is PRESUMPTIVE POSTIVE SARS-CoV-2 nucleic acids MAY BE PRESENT.   A  presumptive positive result was obtained on the submitted specimen  and confirmed on repeat testing.  While 2019 novel coronavirus  (SARS-CoV-2) nucleic acids may be present in the submitted sample  additional confirmatory testing may be necessary for epidemiological  and / or clinical management purposes  to differentiate between  SARS-CoV-2 and other Sarbecovirus currently known to infect humans.  If clinically indicated additional testing with an alternate test  methodology [phone]) is advised. The SARS-CoV-2 RNA is generally  detectable in upper and lower respiratory sp ecimens during the acute  phase of infection. The expected result is Negative. Fact Sheet for Patients:  StrictlyIdeas.no Fact Sheet for Healthcare Providers: BankingDealers.co.za This test is not yet approved or cleared by the [place] FDA and has been authorized for detection and/or diagnosis of SARS-CoV-2 by FDA under an Emergency Use Authorization (EUA).  This EUA will remain in effect (meaning this test can be used) for the duration of the COVID-19 declaration under Section 564(b)(1) of the Act, 21 U.S.C. section 360bbb-3(b)(1), unless the authorization is terminated or revoked sooner. Performed at [facility], [place] [address] [place], [place] [address]    Dg Chest 2 View  Result Date: 04/21/2019 CLINICAL DATA:  Sinus infection and pneumonia. Shortness of breath. EXAM: CHEST - 2 VIEW COMPARISON:  April 19, 2019 FINDINGS: Cardiomediastinal silhouette is normal. Mediastinal contours appear intact. Persistent multifocal areas of airspace consolidation throughout the right lung, worse in the right upper lobe. Osseous structures are without acute abnormality. Soft tissues are grossly normal. IMPRESSION: Persistent multifocal areas of airspace consolidation throughout the right lung, worse in the right upper lobe. Electronically Signed   By: Fidela Salisbury  M.D.   On: 04/21/2019 20:03    Pending Labs Unresulted Labs (From admission, onward)    Start     Ordered   04/21/19 2132  Culture, blood (Routine X 2) w Reflex to ID Panel  BLOOD CULTURE X 2,   R (with STAT occurrences)    Comments: Please obtain prior to antibiotic administration.    04/21/19 2131   Signed and Held  Influenza panel by PCR (type A & B)  Add-on,   R     Signed and Held   Signed and Held  Respiratory Panel by PCR  (Respiratory virus panel with precautions)  Add-on,   R     Signed and Held   Signed and Held  HIV antibody (Routine Screening)  Once,   R     Signed and Held   Signed and Held  Legionella Pneumophila Serogp 1 Ur Ag  Once,   R     Signed and Held   Signed and Held  Strep pneumoniae urinary antigen  Once,   R     Signed and Held          Vitals/Pain Today's Vitals   04/21/19 1908 04/21/19 2003 04/21/19 2141 04/21/19 2222  BP:  (!) 146/77    Pulse:  82 77   Resp:  17    Temp:      TempSrc:      SpO2:  93% 93%   PainSc: 0-No pain   8     Isolation Precautions No active isolations  Medications Medications  cefTRIAXone (ROCEPHIN) 2 g in sodium chloride 0.9 % 100 mL IVPB (0 g Intravenous Stopped 04/21/19 2133)  azithromycin (ZITHROMAX) 500 mg in sodium chloride 0.9 % 250 mL IVPB (500 mg Intravenous New Bag/Given 04/21/19 2222)  hydrALAZINE (APRESOLINE) injection 5 mg (has no administration in time range)  azithromycin (ZITHROMAX) 500 mg in sodium chloride 0.9 % 250 mL IVPB (has no administration in time range)  sodium chloride flush (NS) 0.9 % injection 3 mL (3 mLs Intravenous Given 04/21/19 2222)  acetaminophen (TYLENOL) tablet 1,000 mg (1,000 mg Oral Given 04/21/19 2059)    Mobility walks Low fall risk   Focused Assessments    R Recommendations: See Admitting Provider Note  Report given to:   Additional Notes:

## 2019-04-21 NOTE — H&P (Signed)
History and Physical    Roger Durham PPJ:[id number] DOB: [birth year] DOA: 04/21/2019  Referring MD/NP/PA:   PCP: Lavone Orn, MD   Patient coming from:  The patient is coming from home.  At baseline, pt is independent for most of ADL.        Chief Complaint: Shortness of breath, cough, sinus pain  HPI: Roger Durham is a 63 y.o. male with medical history significant of hypertension, hyperlipidemia, prostate cancer, hypothyroidism, chronic back pain, chronic pain syndrome on methadone, who presents with shortness of breath, cough, sinus pain.  Patient states that his symptoms have been going on for about 9 days, including sinus pain and pressure, productive cough, shortness of breath and mild pleuritic right-sided chest pain. His symptoms has been progressively worsening.  Patient was seen by PCP on 04/15/2019, and they diagnosed him as pneumonia.  Patient was started on "a cephalosporin" for 5 days.  After completed course of antibiotics, he did not feel better. He saw PCP again on 04/19/2019, and had a chest x-ray, which showed multifocal pneumonia.  Patient was switched to doxycycline.  He has been taking doxycycline in the past 2 days, but no improvement.  He also had COVID swab by his PCP which was negative. Of note,  patient states that when he was gardening roughly 2 days prior to start of his symptoms, he was exposed to a white powdery substance from a bucket of leaves he was cleaning out.  He is unsure if these are related to his symptoms. Patient has nausea, no vomiting, diarrhea or abdominal pain.  No symptoms of UTI or unilateral weakness.   ED Course: pt was found to have negative covid 19 in ED, WBC 11.9, lactic acid 1.2, electrolytes renal function okay, pending COVID-19 test, temperature 99.5, blood pressure 146/77, heart rate 82, respiration rate 17, oxygen saturation 88% on room air initially, currently 93 to 97%.  Checks x-ray showed persistent multifocal areas of airspace consolidation  throughout the right lung, worse in the right upper lobe.  Patient is admitted to telemetry bed as inpatient.  Review of Systems:   General: no fevers, chills, no body weight gain, has poor appetite, has fatigue HEENT: no blurry vision, hearing changes or sore throat. Sinus pressure and pain. Respiratory: has dyspnea, coughing, no wheezing CV: no chest pain, no palpitations GI: no nausea, vomiting, abdominal pain, diarrhea, constipation GU: no dysuria, burning on urination, increased urinary frequency, hematuria  Ext: no leg edema Neuro: no unilateral weakness, numbness, or tingling, no vision change or hearing loss Skin: no rash, no skin tear. MSK: No muscle spasm, no deformity, no limitation of range of movement in spin Heme: No easy bruising.  Travel history: No recent long distant travel.  Allergy:  Allergies  Allergen Reactions   Penicillins Other (See Comments)    Tingling in mouth. Intolerance.    Past Medical History:  Diagnosis Date   Cancer ([place])    bx. 2'14- dx. Prostate cancer.-surgery planned   Chronic back pain    lumbar 3.4.5 level -tx. pain clinic-Dr. R. Rauch[facility]   Hematuria    off and on   Hypertension    Hypothyroidism    tx. Levothyroxine   Neuromuscular disorder ([place])    due to back pain issues-left leg"cramping, burning,sharp"   Prostate cancer ([place])    Seasonal allergies    environmental allergies    Past Surgical History:  Procedure Laterality Date   APPENDECTOMY  1982   BACK SURGERY  multiple back surgery-retained hardware '08-Ray cage   CYSTOSCOPY WITH BIOPSY N/A 10/12/2018   Procedure: CYSTOSCOPY WITH BLADDER BIOPSY/ FULGURATION BIOPSY 0.5 to 2 cm;  Surgeon: Festus Aloe, MD;  Location: [facility];  Service: Urology;  Laterality: N/A;   KNEE ARTHROSCOPY  1980's   left knee-torn menicus   LYMPHADENECTOMY Bilateral 11/21/2012   Procedure: LYMPHADENECTOMY;  Surgeon: Bernestine Amass, MD;  Location: [facility];  Service: Urology;  Laterality: Bilateral;   NASAL SEPTOPLASTY W/ TURBINOPLASTY  1990's   Seasonal allergies   ROBOT ASSISTED LAPAROSCOPIC RADICAL PROSTATECTOMY N/A 11/21/2012   Procedure: ROBOTIC ASSISTED LAPAROSCOPIC RADICAL PROSTATECTOMY;  Surgeon: Bernestine Amass, MD;  Location: [facility];  Service: Urology;  Laterality: N/A;  WITH BILATERAL PELVIC LYMPH NODE DISSECTION     Social History:  reports that he quit smoking about 9 years ago. His smoking use included cigarettes. He has a 12.50 pack-year smoking history. He has never used smokeless tobacco. He reports previous alcohol use. He reports that he does not use drugs.  Family History:  Family History  Problem Relation Age of Onset   Cancer Brother        liver     Prior to Admission medications   Medication Sig Start Date End Date Taking? Authorizing Provider  amLODipine-benazepril (LOTREL) 5-10 MG per capsule Take 1 capsule by mouth every morning.   Yes [provider]  aspirin 81 MG tablet Take 1 tablet (81 mg total) by mouth daily. 10/17/18  Yes Festus Aloe, MD  atorvastatin (LIPITOR) 10 MG tablet Take 10 mg by mouth at bedtime.   Yes [provider]  beclomethasone (QVAR) 80 MCG/ACT inhaler Inhale 1 puff into the lungs as needed. 01/22/16  Yes [provider]  levothyroxine (SYNTHROID) 112 MCG tablet Take 112 mcg by mouth daily before breakfast.    Yes [provider]  methadone (DOLOPHINE) 10 MG tablet Take 10 mg by mouth 3 (three) times daily. For pain by pain management center   Yes [provider]  acetaminophen (TYLENOL) 500 MG tablet Take 1,000 mg by mouth every 6 (six) hours as needed for pain.    [provider]    Physical Exam: Vitals:   04/21/19 2003 04/21/19 2141 04/21/19 2245 04/22/19 0016  BP: (!) 146/77     Pulse: 82 77 93   Resp: 17  17   Temp:      TempSrc:      SpO2: 93% 93% 95% 95%   General: Not in acute  distress HEENT:       Eyes: PERRL, EOMI, no scleral icterus.       ENT: No discharge from the ears and nose, no pharynx injection, no tonsillar enlargement.        Neck: No JVD, no bruit, no mass felt. Heme: No neck lymph node enlargement. Cardiac: S1/S2, RRR, No murmurs, No gallops or rubs. Respiratory: has coarse breathing sound and rhonchi on the right side. GI: Soft, nondistended, nontender, no rebound pain, no organomegaly, BS present. GU: No hematuria Ext: No pitting leg edema bilaterally. 2+DP/PT pulse bilaterally. Musculoskeletal: No joint deformities, No joint redness or warmth, no limitation of ROM in spin. Skin: No rashes.  Neuro: Alert, oriented X3, cranial nerves II-XII grossly intact, moves all extremities normally.  Psych: Patient is not psychotic, no suicidal or hemocidal ideation.  Labs on Admission: I have personally reviewed following labs and imaging studies  CBC: Recent Labs  Lab 04/21/19 1917  WBC 11.9*  NEUTROABS 8.3*  HGB 12.1*  HCT 37.5*  MCV 86.8  PLT 401   Basic Metabolic Panel: Recent Labs  Lab 04/21/19 1917  NA 135  K 4.2  CL 97*  CO2 26  GLUCOSE 118*  BUN 16  CREATININE 0.70  CALCIUM 8.5*   GFR: CrCl cannot be calculated (Unknown ideal weight.). Liver Function Tests: Recent Labs  Lab 04/21/19 1917  AST 26  ALT 31  ALKPHOS 83  BILITOT 0.5  PROT 6.8  ALBUMIN 2.8*   No results for input(s): LIPASE, AMYLASE in the last 168 hours. No results for input(s): AMMONIA in the last 168 hours. Coagulation Profile: No results for input(s): INR, PROTIME in the last 168 hours. Cardiac Enzymes: No results for input(s): CKTOTAL, CKMB, CKMBINDEX, TROPONINI in the last 168 hours. BNP (last 3 results) No results for input(s): PROBNP in the last 8760 hours. HbA1C: No results for input(s): HGBA1C in the last 72 hours. CBG: No results for input(s): GLUCAP in the last 168 hours. Lipid Profile: No results for input(s): CHOL, HDL, LDLCALC, TRIG,  CHOLHDL, LDLDIRECT in the last 72 hours. Thyroid Function Tests: No results for input(s): TSH, T4TOTAL, FREET4, T3FREE, THYROIDAB in the last 72 hours. Anemia Panel: No results for input(s): VITAMINB12, FOLATE, FERRITIN, TIBC, IRON, RETICCTPCT in the last 72 hours. Urine analysis:    Component Value Date/Time   LABSPEC 1.015 01/28/2016 1044   PHURINE 5.0 01/28/2016 1044   GLUCOSEU Negative 01/28/2016 1044   HGBUR Moderate 01/28/2016 1044   BILIRUBINUR Negative 01/28/2016 1044   KETONESUR Negative 01/28/2016 1044   PROTEINUR Negative 01/28/2016 1044   UROBILINOGEN 0.2 01/28/2016 1044   NITRITE Negative 01/28/2016 1044   LEUKOCYTESUR Negative 01/28/2016 1044   Sepsis Labs: @LABRCNTIP (procalcitonin:4,lacticidven:4) ) Recent Results (from the past 240 hour(s))  SARS Coronavirus 2 [facility] order, Performed in [facility] hospital lab) Nasopharyngeal Nasopharyngeal Swab     Status: None   Collection Time: 04/21/19  8:58 PM   Specimen: Nasopharyngeal Swab  Result Value Ref Range Status   SARS Coronavirus 2 NEGATIVE NEGATIVE Final    Comment: (NOTE) If result is NEGATIVE SARS-CoV-2 target nucleic acids are NOT DETECTED. The SARS-CoV-2 RNA is generally detectable in upper and lower  respiratory specimens during the acute phase of infection. The lowest  concentration of SARS-CoV-2 viral copies this assay can detect is 250  copies / mL. A negative result does not preclude SARS-CoV-2 infection  and should not be used as the sole basis for treatment or other  patient management decisions.  A negative result may occur with  improper specimen collection / handling, submission of specimen other  than nasopharyngeal swab, presence of viral mutation(s) within the  areas targeted by this assay, and inadequate number of viral copies  (<250 copies / mL). A negative result must be combined with clinical  observations, patient history, and epidemiological information. If result is  POSITIVE SARS-CoV-2 target nucleic acids are DETECTED. The SARS-CoV-2 RNA is generally detectable in upper and lower  respiratory specimens dur ing the acute phase of infection.  Positive  results are indicative of active infection with SARS-CoV-2.  Clinical  correlation with patient history and other diagnostic information is  necessary to determine patient infection status.  Positive results do  not rule out bacterial infection or co-infection with other viruses. If result is PRESUMPTIVE POSTIVE SARS-CoV-2 nucleic acids MAY BE PRESENT.   A presumptive positive result was obtained on the submitted specimen  and confirmed on repeat testing.  While 2019  novel coronavirus  (SARS-CoV-2) nucleic acids may be present in the submitted sample  additional confirmatory testing may be necessary for epidemiological  and / or clinical management purposes  to differentiate between  SARS-CoV-2 and other Sarbecovirus currently known to infect humans.  If clinically indicated additional testing with an alternate test  methodology [phone]) is advised. The SARS-CoV-2 RNA is generally  detectable in upper and lower respiratory sp ecimens during the acute  phase of infection. The expected result is Negative. Fact Sheet for Patients:  StrictlyIdeas.no Fact Sheet for Healthcare Providers: BankingDealers.co.za This test is not yet approved or cleared by the [place] FDA and has been authorized for detection and/or diagnosis of SARS-CoV-2 by FDA under an Emergency Use Authorization (EUA).  This EUA will remain in effect (meaning this test can be used) for the duration of the COVID-19 declaration under Section 564(b)(1) of the Act, 21 U.S.C. section 360bbb-3(b)(1), unless the authorization is terminated or revoked sooner. Performed at [facility], [place] [address], [place], [place] [address]      Radiological Exams on Admission: Dg Chest 2  View  Result Date: 04/21/2019 CLINICAL DATA:  Sinus infection and pneumonia. Shortness of breath. EXAM: CHEST - 2 VIEW COMPARISON:  April 19, 2019 FINDINGS: Cardiomediastinal silhouette is normal. Mediastinal contours appear intact. Persistent multifocal areas of airspace consolidation throughout the right lung, worse in the right upper lobe. Osseous structures are without acute abnormality. Soft tissues are grossly normal. IMPRESSION: Persistent multifocal areas of airspace consolidation throughout the right lung, worse in the right upper lobe. Electronically Signed   By: Fidela Salisbury M.D.   On: 04/21/2019 20:03     EKG: Independently reviewed.  Sinus rhythm, QTC 400, LAE, low voltage, occasional PVC   Assessment/Plan Principal Problem:   Multifocal pneumonia Active Problems:   Hypothyroidism   HTN (hypertension)   HLD (hyperlipidemia)   Acute respiratory failure with hypoxia (HCC)   Chronic pain syndrome   Acute respiratory failure with hypoxia due to multifocal pneumonia: pt failed outpt antibiotic treatment.  Has mild leukocytosis with WBC 11.9, mild fever temperature 99.5.  Clinically does not meet criteria for sepsis. - will admit to tele bed as inpt - IV Rocephin and azithromycin - will add Flagyl for possible aspiration given right-sided infiltration - Mucinex for cough  - Atrovent inhaler, prn albuterol  inhaler - Urine legionella and S. pneumococcal antigen - Follow up blood culture x2, sputum culture and respiratory virus panel, plus Flu pcr - SLP  Hypothyroidism: -Synthroid  HTN (hypertension): -IV hydralazine as needed -Amlodipine- benazepril  HLD (hyperlipidemia): -lipitor  Chronic pain syndrome and chronic back pain: -Continue home methadone  Inpatient status:  # Patient requires inpatient status due to high intensity of service, high risk for further deterioration and high frequency of surveillance required.  I certify that at the point of admission  it is my clinical judgment that the patient will require inpatient hospital care spanning beyond 2 midnights from the point of admission.   This patient has multiple chronic comorbidities including hypertension, hyperlipidemia, prostate cancer, hypothyroidism, chronic back pain, on methadone  Now patient has presenting with acute respiratory failure with hypoxia due to multifocal pneumonia  The worrisome physical exam findings include has coarse breathing sound and rhonchi on the right side.  The initial radiographic and laboratory data are worrisome because of multifocal infiltration on chest x-ray  Current medical needs: please see my assessment and plan  Predictability of an adverse outcome (risk): Patient has  multiple comorbidities, now presents with acute respiratory failure with hypoxia due to multifocal pneumonia.  Patient failed outpatient antibiotic treatment.  His presentation is highly complicated and he is high risk for deteriorating.  Patient will need to be treated in hospital for at least 2 days.    DVT ppx: SQ Lovenox Code Status: Full code Family Communication: talked to his daughter by phone  Disposition Plan:  Anticipate discharge back to previous home environment Consults called:  none Admission status:  Inpatient/tele    Date of Service 04/22/2019    [facility]   If 7PM-7AM, please contact night-coverage [email or web address] Password [email or web address] 04/22/2019, 12:19 AM

## 2019-04-22 DIAGNOSIS — G894 Chronic pain syndrome: Secondary | ICD-10-CM | POA: Diagnosis present

## 2019-04-22 DIAGNOSIS — J9601 Acute respiratory failure with hypoxia: Secondary | ICD-10-CM

## 2019-04-22 DIAGNOSIS — R918 Other nonspecific abnormal finding of lung field: Secondary | ICD-10-CM

## 2019-04-22 DIAGNOSIS — J189 Pneumonia, unspecified organism: Secondary | ICD-10-CM

## 2019-04-22 LAB — RESPIRATORY PANEL BY PCR

## 2019-04-22 LAB — INFLUENZA PANEL BY PCR (TYPE A & B)
Influenza A By PCR: NEGATIVE
Influenza B By PCR: NEGATIVE

## 2019-04-22 LAB — HIV ANTIBODY (ROUTINE TESTING W REFLEX): HIV Screen 4th Generation wRfx: NONREACTIVE

## 2019-04-22 LAB — STREP PNEUMONIAE URINARY ANTIGEN: Strep Pneumo Urinary Antigen: NEGATIVE

## 2019-04-22 LAB — PROCALCITONIN: Procalcitonin: <0.1 ng/mL

## 2019-04-22 MED ORDER — METRONIDAZOLE IN NACL 5-0.79 MG/ML-% IV SOLN
500.0000 mg | Freq: Three times a day (TID) | INTRAVENOUS | Status: DC
  Administered 2019-04-22: 500 mg via INTRAVENOUS
  Filled 2019-04-22: qty 100

## 2019-04-22 MED ORDER — PREDNISONE 20 MG PO TABS
40.0000 mg | ORAL_TABLET | Freq: Every day | ORAL | Status: DC
  Administered 2019-04-22 – 2019-04-23 (×2): 40 mg via ORAL
  Filled 2019-04-22: qty 4
  Filled 2019-04-22: qty 2

## 2019-04-22 MED ORDER — METHADONE HCL 10 MG PO TABS
10.0000 mg | ORAL_TABLET | Freq: Three times a day (TID) | ORAL | Status: DC
  Administered 2019-04-22 – 2019-04-26 (×9): 10 mg via ORAL
  Filled 2019-04-22 (×12): qty 1

## 2019-04-22 MED ORDER — SODIUM CHLORIDE 0.9 % IV SOLN
2.0000 g | INTRAVENOUS | Status: DC
  Filled 2019-04-22: qty 20

## 2019-04-22 MED ORDER — AMLODIPINE BESYLATE 5 MG PO TABS
5.0000 mg | ORAL_TABLET | Freq: Every day | ORAL | Status: DC
  Administered 2019-04-22 – 2019-04-27 (×6): 5 mg via ORAL
  Filled 2019-04-22 (×6): qty 1

## 2019-04-22 MED ORDER — ENOXAPARIN SODIUM 40 MG/0.4ML ~~LOC~~ SOLN
40.0000 mg | SUBCUTANEOUS | Status: DC
  Administered 2019-04-22 – 2019-04-27 (×6): 40 mg via SUBCUTANEOUS
  Filled 2019-04-22 (×6): qty 0.4

## 2019-04-22 MED ORDER — ONDANSETRON HCL 4 MG/2ML IJ SOLN
4.0000 mg | Freq: Three times a day (TID) | INTRAMUSCULAR | Status: DC | PRN
  Filled 2019-04-22: qty 2

## 2019-04-22 MED ORDER — DIPHENHYDRAMINE HCL 25 MG PO CAPS
25.0000 mg | ORAL_CAPSULE | ORAL | Status: DC | PRN
  Filled 2019-04-22: qty 1

## 2019-04-22 MED ORDER — BENAZEPRIL HCL 5 MG PO TABS
10.0000 mg | ORAL_TABLET | Freq: Every day | ORAL | Status: DC
  Administered 2019-04-22 – 2019-04-27 (×6): 10 mg via ORAL
  Filled 2019-04-22 (×6): qty 2

## 2019-04-22 MED ORDER — ALBUTEROL SULFATE (2.5 MG/3ML) 0.083% IN NEBU: 3.0000 mL | INHALATION_SOLUTION | RESPIRATORY_TRACT | Status: DC | PRN

## 2019-04-22 MED ORDER — IPRATROPIUM BROMIDE 0.02 % IN SOLN: 0.5000 mg | Freq: Four times a day (QID) | RESPIRATORY_TRACT | Status: DC

## 2019-04-22 MED ORDER — DM-GUAIFENESIN ER 30-600 MG PO TB12
1.0000 | ORAL_TABLET | Freq: Two times a day (BID) | ORAL | Status: DC | PRN
  Administered 2019-04-22 (×3): 1 via ORAL
  Filled 2019-04-22 (×3): qty 1

## 2019-04-22 MED ORDER — ATORVASTATIN CALCIUM 10 MG PO TABS
10.0000 mg | ORAL_TABLET | Freq: Every day | ORAL | Status: DC
  Administered 2019-04-22 – 2019-04-26 (×5): 10 mg via ORAL
  Filled 2019-04-22 (×5): qty 1

## 2019-04-22 MED ORDER — ASPIRIN 81 MG PO CHEW
81.0000 mg | CHEWABLE_TABLET | Freq: Every day | ORAL | Status: DC
  Administered 2019-04-22 – 2019-04-27 (×6): 81 mg via ORAL
  Filled 2019-04-22 (×6): qty 1

## 2019-04-22 MED ORDER — LEVOTHYROXINE SODIUM 112 MCG PO TABS
112.0000 ug | ORAL_TABLET | Freq: Every day | ORAL | Status: DC
  Administered 2019-04-22 – 2019-04-27 (×6): 112 ug via ORAL
  Filled 2019-04-22 (×7): qty 1

## 2019-04-22 MED ORDER — IPRATROPIUM BROMIDE 0.02 % IN SOLN: 0.5000 mg | Freq: Four times a day (QID) | RESPIRATORY_TRACT | Status: DC | PRN

## 2019-04-22 MED ORDER — SODIUM CHLORIDE 0.9 % IV SOLN
500.0000 mg | INTRAVENOUS | Status: DC
  Filled 2019-04-22: qty 500

## 2019-04-22 MED ORDER — AMLODIPINE BESY-BENAZEPRIL HCL 5-10 MG PO CAPS: 1.0000 | ORAL_CAPSULE | Freq: Every morning | ORAL | Status: DC

## 2019-04-22 MED ORDER — ACETAMINOPHEN 325 MG PO TABS
650.0000 mg | ORAL_TABLET | Freq: Four times a day (QID) | ORAL | Status: DC | PRN
  Administered 2019-04-22 – 2019-04-25 (×10): 650 mg via ORAL
  Filled 2019-04-22 (×10): qty 2

## 2019-04-22 NOTE — Consult Note (Addendum)
Roger Durham, MRN:  [id number], DOB:  01-23-1956, LOS: 1 ADMISSION DATE:  04/21/2019, CONSULTATION DATE:  8/10 REFERRING MD:  Dr. Benny Lennert TRH, CHIEF COMPLAINT:  SOB   Brief History   63 year old male admitted 8/9 with presumed pneumonia.   History of present illness   63 year old male with PMH as below, which is significant for HTN, Hypothyroid, neuromuscular disorder, and chronic back pain on methadone. He is a retired Immunologist. He was in his usual state of health until the beginning of August when he worked with some brush in his back yard and inhaled what he belived to be a white power from within the brush. Since that time he has had progressive sinus pressure, dyspnea, and pleuritic chest pain with associated fevers. He also had cough productive for yellow tinged sputum. He presented with these complaints to his PCP on 8/3 and was prescribed a cephalosporin and again 8/7 after feeling worse and was given doxycycline. Then 8/9 with worsening symptoms he presented to the ED. In ED he was hypoxemic on room air requiring supplemental O2. CXR demonstrated multifocal airspace consolidation most notable in the RUL. He was admitted to the hospitalist service and treated for CAP with IV antibiotics. He requested pulmonary consultation 8/10.   Past Medical History   has a past medical history of Cancer ([place]), Chronic back pain, Hematuria, Hypertension, Hypothyroidism, Neuromuscular disorder ([name]), Prostate cancer ([place]), and Seasonal allergies.  Significant Hospital Events   8/9 admit for presumed CAP  Consults:  PCCM 8/10  Procedures:    Significant Diagnostic Tests:    Micro Data:  Blood 8/9 >  Sputum 8/10 >  RVP 8/9 > neg  Antimicrobials:  Ceftriaxone 8/10 > Azithromycin 8/10 >  Interim history/subjective:    Objective   Blood pressure 129/60, pulse 68, temperature 98.8 F (37.1 C), temperature source Oral, resp. rate (!) 22, height 5\' 9"  (1.753 m), weight 98.4 kg, SpO2 94 %.   FiO2 (%):  [21 %] 21 %   Intake/Output Summary (Last 24 hours) at 04/22/2019 1250 Last data filed at 04/21/2019 2322 Gross per 24 hour  Intake 350 ml  Output -  Net 350 ml   Filed Weights   04/22/19 0036  Weight: 98.4 kg    Examination: General: middle aged male of normal body habitus in NAD HENT: [name], PERRL, no JVD Lungs: Coarse R>L. Shallow respirations.  Cardiovascular: RRR, on MRG Abdomen: Soft, non-tender, non-distended Extremities: No acute deformity or ROM limitation Neuro: Alert, oriented, non-focal  Resolved Hospital Problem list     Assessment & Plan:   Acute hypoxemic respiratory failure: suspect this is secondary to bacterial pneumonia. Not sure what to make of the "powder" inhalation from the leaf pile. Cannot rule out hypersensitivity pneumonitis.  - Continue IV ceftriaxone and azithromycin - DC flagyl - check PCT, IgE - Add sputum culture - Consider non-contrast CT chest if symptoms do not improve.   Sinusitis - abx as above, per primary  Uncertain COPD history: carries diagnosis, but does not believe he has ever had PFT. Has a 5 pack year history and quit in 2011. - Continue PRN duoneb - Outpatient workup needed at some point.   Remainder per primary service.   Best practice:  Diet: per primary Pain/Anxiety/Delirium protocol (if indicated): Na VAP protocol (if indicated): Na DVT prophylaxis: per primary GI prophylaxis: per primary Glucose control: NA Mobility: Per primary Code Status: FULL Family Communication: Patient and wife updated at bedside Disposition:  Floor  Labs   CBC: Recent Labs  Lab 04/21/19 1917  WBC 11.9*  NEUTROABS 8.3*  HGB 12.1*  HCT 37.5*  MCV 86.8  PLT 878    Basic Metabolic Panel: Recent Labs  Lab 04/21/19 1917  NA 135  K 4.2  CL 97*  CO2 26  GLUCOSE 118*  BUN 16  CREATININE 0.70  CALCIUM 8.5*   GFR: Estimated Creatinine Clearance: 107.9 mL/min (by C-G formula based on SCr of 0.7 mg/dL). Recent  Labs  Lab 04/21/19 1917 04/21/19 2058  WBC 11.9*  --   LATICACIDVEN 1.2 1.0    Liver Function Tests: Recent Labs  Lab 04/21/19 1917  AST 26  ALT 31  ALKPHOS 83  BILITOT 0.5  PROT 6.8  ALBUMIN 2.8*   No results for input(s): LIPASE, AMYLASE in the last 168 hours. No results for input(s): AMMONIA in the last 168 hours.  ABG No results found for: PHART, PCO2ART, PO2ART, HCO3, TCO2, ACIDBASEDEF, O2SAT   Coagulation Profile: No results for input(s): INR, PROTIME in the last 168 hours.  Cardiac Enzymes: No results for input(s): CKTOTAL, CKMB, CKMBINDEX, TROPONINI in the last 168 hours.  HbA1C: No results found for: HGBA1C  CBG: No results for input(s): GLUCAP in the last 168 hours.  Review of Systems:   Bolds are positive  Constitutional: weight loss, gain, night sweats, Fevers, chills, fatigue .  HEENT: headaches, Sore throat, sneezing, nasal congestion, post nasal drip, Difficulty swallowing, Tooth/dental problems, visual complaints visual changes, ear ache CV:  chest pain, radiates:,Orthopnea, PND, swelling in lower extremities, dizziness, palpitations, syncope.  GI  heartburn, indigestion, abdominal pain, nausea, vomiting, diarrhea, change in bowel habits, loss of appetite, bloody stools.  Resp: cough, productive:yellow , hemoptysis, dyspnea, chest pain, pleuritic.  Skin: rash or itching or icterus GU: dysuria, change in color of urine, urgency or frequency. flank pain, hematuria  MS: joint pain or swelling. decreased range of motion  Psych: change in mood or affect. depression or anxiety.  Neuro: difficulty with speech, weakness, numbness, ataxia    Past Medical History  He,  has a past medical history of Cancer ([name]), Chronic back pain, Hematuria, Hypertension, Hypothyroidism, Neuromuscular disorder ([name]), Prostate cancer ([name]), and Seasonal allergies.   Surgical History    Past Surgical History:  Procedure Laterality Date  . APPENDECTOMY  1982  . BACK  SURGERY     multiple back surgery-retained hardware '08-Ray cage  . CYSTOSCOPY WITH BIOPSY N/A 10/12/2018   Procedure: CYSTOSCOPY WITH BLADDER BIOPSY/ FULGURATION BIOPSY 0.5 to 2 cm;  Surgeon: Festus Aloe, MD;  Location: [facility];  Service: Urology;  Laterality: N/A;  . KNEE ARTHROSCOPY  1980's   left knee-torn menicus  . LYMPHADENECTOMY Bilateral 11/21/2012   Procedure: LYMPHADENECTOMY;  Surgeon: Bernestine Amass, MD;  Location: [facility];  Service: Urology;  Laterality: Bilateral;  . NASAL SEPTOPLASTY W/ TURBINOPLASTY  1990's   Seasonal allergies  . ROBOT ASSISTED LAPAROSCOPIC RADICAL PROSTATECTOMY N/A 11/21/2012   Procedure: ROBOTIC ASSISTED LAPAROSCOPIC RADICAL PROSTATECTOMY;  Surgeon: Bernestine Amass, MD;  Location: [facility];  Service: Urology;  Laterality: N/A;  WITH BILATERAL PELVIC LYMPH NODE DISSECTION      Social History   reports that he quit smoking about 9 years ago. His smoking use included cigarettes. He has a 12.50 pack-year smoking history. He has never used smokeless tobacco. He reports previous alcohol use. He reports that he does not use drugs.   Family History   His family history includes Cancer  in his brother.   Allergies Allergies  Allergen Reactions  . Penicillins Other (See Comments)    Tingling in mouth. Intolerance.     Home Medications  Prior to Admission medications   Medication Sig Start Date End Date Taking? Authorizing Provider  acetaminophen (TYLENOL) 500 MG tablet Take 1,000 mg by mouth every 6 (six) hours as needed for pain.   Yes [provider]  amLODipine-benazepril (LOTREL) 5-10 MG per capsule Take 1 capsule by mouth every morning.   Yes [provider]  aspirin 81 MG tablet Take 1 tablet (81 mg total) by mouth daily. 10/17/18  Yes Festus Aloe, MD  atorvastatin (LIPITOR) 10 MG tablet Take 10 mg by mouth at bedtime.   Yes [provider]  beclomethasone (QVAR) 80 MCG/ACT inhaler Inhale 1 puff into  the lungs as needed. 01/22/16  Yes [provider]  diphenhydrAMINE (BENADRYL) 25 mg capsule Take 25 mg by mouth as needed for allergies.   Yes [provider]  EPINEPHrine 0.3 mg/0.3 mL IJ SOAJ injection Inject 0.3 mg into the muscle as needed for anaphylaxis or allergies. 08/01/16  Yes [provider]  levothyroxine (SYNTHROID) 112 MCG tablet Take 112 mcg by mouth daily before breakfast.    Yes [provider]  methadone (DOLOPHINE) 10 MG tablet Take 10 mg by mouth 3 (three) times daily. For pain by pain management center   Yes [provider]      Georgann Housekeeper, AGACNP-BC [place] Pager [phone] or [phone]  04/22/2019 1:28 PM    Attending Note:  63 year old male with remote history of COPD who presents to PCCM with hypoxemia, pulmonary infiltrate and cough.  On exam, bibasilar crackles.  I reviewed CXR myself, R>L infiltrate noted.  Discussed with PCCM-NP.  Pulmonary infiltrate: PNA vs HSP (exposure to fungus and wood shavings 1 wk prior)  - Procalcitonin, can d/c abx if normal as patient has already completed a course of abx  - Eos WNL  - Continue abx for now  - F/u on cultures  - RVP  Hypoxemia:  - Titrate O2 for sat of 88-92%  - May need an ambulatory desaturation study prior to discharge for ?home O2  COPD:  - PRN albuterol  - Hold of further BD at this point  PCCM will continue to follow.  Patient seen and examined, agree with above note.  I dictated the care and orders written for this patient under my direction.  Rush Farmer, [place]

## 2019-04-22 NOTE — Progress Notes (Signed)
SLP Cancellation Note  Patient Details Name: DEVAUGHN SAVANT MRN: [id number] DOB: [birth year]   Cancelled treatment:       Reason Eval/Treat Not Completed: SLP screened, no needs identified, will sign off. Discussed history with pt and wife who are familiar with s/s of dysphagia and aspiration. They have no concerns in this regard. Will defer SLP eval at this time.   Herbie Baltimore, MA CCC-SLP  [facility] Services Pager [phone] Office [phone]  Lynann Beaver 04/22/2019, 9:19 AM

## 2019-04-22 NOTE — Progress Notes (Signed)
PROGRESS NOTE  Roger Durham YFV:[id number] DOB: Jan 12, 1956 DOA: 04/21/2019 PCP: Lavone Orn, MD  Brief History    Roger Durham is a 63 y.o. male with medical history significant of hypertension, hyperlipidemia, prostate cancer, hypothyroidism, chronic back pain, chronic pain syndrome on methadone, who presents with shortness of breath, cough, sinus pain.  Patient states that his symptoms have been going on for about 9 days, including sinus pain and pressure, productive cough, shortness of breath and mild pleuritic right-sided chest pain. His symptoms has been progressively worsening.  Patient was seen by PCP on 04/15/2019, and they diagnosed him as pneumonia.  Patient was started on "a cephalosporin" for 5 days.  After completed course of antibiotics, he did not feel better. He saw PCP again on 04/19/2019, and had a chest x-ray, which showed multifocal pneumonia.  Patient was switched to doxycycline.  He has been taking doxycycline in the past 2 days, but no improvement.  He also had COVID swab by his PCP which was negative. Of note,  patient states that when he was gardening roughly 2 days prior to start of his symptoms, he was exposed to a white powdery substance from a bucket of leaves he was cleaning out. He is unsure if these are related to his symptoms. Patient has nausea, no vomiting, diarrhea or abdominal pain.  No symptoms of UTI or unilateral weakness.   ED Course: pt was found to have negative covid 19 in ED, WBC 11.9, lactic acid 1.2, electrolytes renal function okay, pending COVID-19 test, temperature 99.5, blood pressure 146/77, heart rate 82, respiration rate 17, oxygen saturation 88% on room air initially, currently 93 to 97%.  Checks x-ray showed persistent multifocal areas of airspace consolidation throughout the right lung, worse in the right upper lobe.  Patient is admitted to telemetry bed as inpatient.  Pulmonology was admitted to a telemetry bed. He was been started on Flagyl,  Ceftriaxone and Azithromycin. Pulmonology has been consulted.  Consultants  . Pulmonology  Procedures  . None  Antibiotics   Anti-infectives (From admission, onward)   Start     Dose/Rate Route Frequency Ordered Stop   04/22/19 2300  azithromycin (ZITHROMAX) 500 mg in sodium chloride 0.9 % 250 mL IVPB     500 mg 250 mL/hr over 60 Minutes Intravenous Every 24 hours 04/21/19 2200     04/22/19 1630  metroNIDAZOLE (FLAGYL) IVPB 500 mg     500 mg 100 mL/hr over 60 Minutes Intravenous Every 8 hours 04/22/19 1119     04/21/19 2315  metroNIDAZOLE (FLAGYL) tablet 500 mg  Status:  Discontinued     500 mg Oral Every 8 hours 04/21/19 2310 04/22/19 1119   04/21/19 2100  cefTRIAXone (ROCEPHIN) 2 g in sodium chloride 0.9 % 100 mL IVPB     2 g 200 mL/hr over 30 Minutes Intravenous Every 24 hours 04/21/19 2037     04/21/19 2045  azithromycin (ZITHROMAX) 500 mg in sodium chloride 0.9 % 250 mL IVPB     500 mg 250 mL/hr over 60 Minutes Intravenous  Once 04/21/19 2037 04/21/19 2322    .  Marland Kitchen   Subjective  The patient is awake, alert, and oriented x 3. No acute distress.  Objective   Vitals:  Vitals:   04/22/19 0754 04/22/19 1100  BP: 128/62 129/60  Pulse: 84 68  Resp:    Temp: 99.5 F (37.5 C) 98.8 F (37.1 C)  SpO2: 93% 94%    Exam:  Constitutional:  . The  patient is awake, alert, and oriented x 3. He is in mild distress from hypoxia.  Respiratory:  . The patient is having mild dyspnea with conversation. Marland Kitchen Positive for rhonchi throughout. No wheezes, or rales.  . No tactile fremitus. Cardiovascular:  . Regular rate and rhythm. . No murmurs, ectopy, or gallups . No lateral PMI. No thrills. . No peripheral edema Abdomen:  . Abdomen is soft, non-distended, non-tender. . No hernias, masses, or organomegaly . Normoactive bowel sounds.  Musculoskeletal:  . No cyanosis, clubbing, or edema. Skin:  . No rashes, lesions, ulcers . palpation of skin: no induration or nodules  Neurologic:  . CN 2-12 intact . Sensation all 4 extremities intact Psychiatric:  . Mental status o Mood, affect appropriate o Orientation to person, place, time  . judgment and insight appear intact    I have personally reviewed the following:   Today's Data  . Presenter, broadcasting  . Blood cultures are pending.  Scheduled Meds: . amLODipine  5 mg Oral Daily   And  . benazepril  10 mg Oral Daily  . aspirin  81 mg Oral Daily  . atorvastatin  10 mg Oral QHS  . enoxaparin (LOVENOX) injection  40 mg Subcutaneous Q24H  . levothyroxine  112 mcg Oral Q0600  . methadone  10 mg Oral TID   Continuous Infusions: . azithromycin    . cefTRIAXone (ROCEPHIN)  IV Stopped (04/21/19 2133)  . metronidazole      Principal Problem:   Multifocal pneumonia Active Problems:   Hypothyroidism   HTN (hypertension)   HLD (hyperlipidemia)   Acute respiratory failure with hypoxia (HCC)   Chronic pain syndrome   LOS: 1 day   A & P  Acute respiratory failure with hypoxia: The patient is requiring 2L to maintain SaO2 of 94%.    Multifocal pneumonia: Pt CXR demonstrates persistent multifocal areas of airspace disease. He has failed a 5 day course of antibiotics. The patient is concerned that he could have developed his dyspnea in response to inhaling and irritant.   Has mild leukocytosis with WBC 11.9, mild fever temperature 99.5.  Clinically does not meet criteria for sepsis. He has been admitted as inpatient on IV Rocephin and Azithromycin. He was also given flagyl for a potential aspiration pneumonia, although this seems unlikely. It has been discontinued. He will receive Mucinex for cough. Atrovent and albuterol inhaler. Urine legionella and S. Pneumococcal antigen have been checked. Strep pneumo was negative. COVID-19 negative, Flu is negative, and Respiratory viral panel is negative. SLP eval demonstrated no concerns about aspiration.   Hypothyroidism: Synthroid  HTN (hypertension): Blood  pressures are controlled on home doses of benazepril and amlodipine.   HLD (hyperlipidemia): lipitor  Chronic pain syndrome and chronic back pain:Continue home methadone  I have seen and examined this patient myself. I have spent 46 minutes in his evaluation and care. I have spent more than 50% of the time in counseling the patient and his daughter. All questions answered to the best of my ability.  DVT prophylaxis: Lovenox Code Status: Full Code Family Communication: I have discussed the patient with the patient's Daughter, Layla. All questions answered to the best of my ability. Disposition Plan: home  Deiontae Rabel, DO [facility] Direct contact: see [email or web address]  7PM-7AM contact night coverage as above 04/22/2019, 2:08 PM  LOS: 1 day

## 2019-04-23 ENCOUNTER — Telehealth: Payer: Self-pay

## 2019-04-23 LAB — CBC WITH DIFFERENTIAL/PLATELET
Abs Immature Granulocytes: 0.07 10*3/uL (ref 0.00–0.07)
Basophils Absolute: 0 10*3/uL (ref 0.0–0.1)
Basophils Relative: 0 %
Eosinophils Absolute: 0.1 10*3/uL (ref 0.0–0.5)
Eosinophils Relative: 0 %
HCT: 34 % — ABNORMAL LOW (ref 39.0–52.0)
Hemoglobin: 11.1 g/dL — ABNORMAL LOW (ref 13.0–17.0)
Immature Granulocytes: 1 %
Lymphocytes Relative: 12 %
Lymphs Abs: 1.5 10*3/uL (ref 0.7–4.0)
MCH: 27.9 pg (ref 26.0–34.0)
MCHC: 32.6 g/dL (ref 30.0–36.0)
MCV: 85.4 fL (ref 80.0–100.0)
Monocytes Absolute: 1.1 10*3/uL — ABNORMAL HIGH (ref 0.1–1.0)
Monocytes Relative: 9 %
Neutro Abs: 9.4 10*3/uL — ABNORMAL HIGH (ref 1.7–7.7)
Neutrophils Relative %: 78 %
Platelets: 430 10*3/uL — ABNORMAL HIGH (ref 150–400)
RBC: 3.98 MIL/uL — ABNORMAL LOW (ref 4.22–5.81)
RDW: 14.2 % (ref 11.5–15.5)
WBC: 12.1 10*3/uL — ABNORMAL HIGH (ref 4.0–10.5)
nRBC: 0 % (ref 0.0–0.2)

## 2019-04-23 LAB — BASIC METABOLIC PANEL
Anion gap: 12 (ref 5–15)
BUN: 15 mg/dL (ref 8–23)
CO2: 27 mmol/L (ref 22–32)
Calcium: 8.5 mg/dL — ABNORMAL LOW (ref 8.9–10.3)
Chloride: 98 mmol/L (ref 98–111)
Creatinine, Ser: 0.73 mg/dL (ref 0.61–1.24)
GFR calc Af Amer: >60 mL/min (ref >60)
GFR calc non Af Amer: >60 mL/min (ref >60)
Glucose, Bld: 128 mg/dL — ABNORMAL HIGH (ref 70–99)
Potassium: 4.2 mmol/L (ref 3.5–5.1)
Sodium: 137 mmol/L (ref 135–145)

## 2019-04-23 LAB — EXPECTORATED SPUTUM ASSESSMENT W GRAM STAIN, RFLX TO RESP C

## 2019-04-23 LAB — LEGIONELLA PNEUMOPHILA SEROGP 1 UR AG: L. pneumophila Serogp 1 Ur Ag: NEGATIVE

## 2019-04-23 MED ORDER — PREDNISONE 20 MG PO TABS
40.0000 mg | ORAL_TABLET | Freq: Every day | ORAL | Status: DC
  Administered 2019-04-24 – 2019-04-27 (×4): 40 mg via ORAL
  Filled 2019-04-23 (×4): qty 2

## 2019-04-23 MED ORDER — PREDNISONE 10 MG PO TABS: 10.0000 mg | ORAL_TABLET | Freq: Every day | ORAL | Status: DC

## 2019-04-23 MED ORDER — PREDNISONE 20 MG PO TABS: 20.0000 mg | ORAL_TABLET | Freq: Every day | ORAL | Status: DC

## 2019-04-23 NOTE — Progress Notes (Signed)
SATURATION QUALIFICATIONS:   Patient Saturations on Room Air at Rest = 94%  Patient Saturations on Room Air while Ambulating = 88%  Patient Saturations on 2L Liters of oxygen while Ambulating = 96%

## 2019-04-23 NOTE — Telephone Encounter (Signed)
Candee Furbish, MD  P [facility] Triage [facility] or me thanks   ---------------------------------------- Pt is still currently admitted.

## 2019-04-23 NOTE — Progress Notes (Signed)
Roger Durham, MRN:  [id number], DOB:  08-31-1956, LOS: 2 ADMISSION DATE:  04/21/2019, CONSULTATION DATE:  8/10 REFERRING MD:  Dr. Benny Lennert TR[facility], CHIEF COMPLAINT:  SOB   Brief History   63 year old male admitted 8/9 with presumed pneumonia.   History of present illness   63 year old male with PMH as below, which is significant for HTN, Hypothyroid, neuromuscular disorder, and chronic back pain on methadone. He is a retired Immunologist. He was in his usual state of health until the beginning of August when he worked with some brush in his back yard and inhaled what he belived to be a white power from within the brush. Since that time he has had progressive sinus pressure, dyspnea, and pleuritic chest pain with associated fevers. He also had cough productive for yellow tinged sputum. He presented with these complaints to his PCP on 8/3 and was prescribed a cephalosporin and again 8/7 after feeling worse and was given doxycycline. Then 8/9 with worsening symptoms he presented to the ED. In ED he was hypoxemic on room air requiring supplemental O2. CXR demonstrated multifocal airspace consolidation most notable in the RUL. He was admitted to the hospitalist service and treated for CAP with IV antibiotics. He requested pulmonary consultation 8/10.   Past Medical History   has a past medical history of Cancer ([place]), Chronic back pain, Hematuria, Hypertension, Hypothyroidism, Neuromuscular disorder ([place]), Prostate cancer ([place]), and Seasonal allergies.  Significant Hospital Events   8/9 admit for presumed CAP  Consults:  PCCM 8/10  Procedures:    Significant Diagnostic Tests:    Micro Data:  Blood 8/9 >  Sputum 8/10 >  RVP 8/9 > neg  Antimicrobials:  Ceftriaxone 8/10 > Azithromycin 8/10 >  Interim history/subjective:  Feeling better today  Objective   Blood pressure 119/66, pulse 61, temperature 98.3 F (36.8 C), temperature source Oral, resp. rate 16, height 5\' 9"  (1.753 m), weight 98.4  kg, SpO2 98 %.        Intake/Output Summary (Last 24 hours) at 04/23/2019 0957 Last data filed at 04/23/2019 0900 Gross per 24 hour  Intake 289.21 ml  Output -  Net 289.21 ml   Filed Weights   04/22/19 0036  Weight: 98.4 kg    Examination:  General: middle aged male in NAD HENT: [place]/AT, PERRL, no JVD Lungs: clear anterior lung fields.  Cardiovascular: RRR, on MRG Abdomen: soft, non-tender.  Extremities: No acute deformity or ROM limitation.  Neuro: alert, oriented, non-focal  Resolved Hospital Problem list     Assessment & Plan:   Acute hypoxemic respiratory failure: initial concern was for bacterial process, however, minimal improvement on ABX and PCT negative. Started on steroids 8/10 with subjective improvement.  - ABX off.  - sputum culture pending.  - Continue steroids with plans for 4 week taper. Dr. Tamala Julian will outline specifics.  - Will need outpatient pulmonary follow up.  - Consider non-contrast CT chest if symptoms do not improve.   Sinusitis - ABX off.  Uncertain COPD history: carries diagnosis, but does not believe he has ever had PFT. Has a 5 pack year history and quit in 2011. - Continue PRN duoneb - Outpatient workup needed at some point.   Remainder per primary service.   Patient requesting to shower, which is fine from pulm standpoint.  Best practice:  Diet: per primary Pain/Anxiety/Delirium protocol (if indicated): Na VAP protocol (if indicated): Na DVT prophylaxis: per primary GI prophylaxis: per primary Glucose control: NA  Mobility: Per primary Code Status: FULL Family Communication: Patient updated.  Disposition: Floor  Labs   CBC: Recent Labs  Lab 04/21/19 1917 04/23/19 0422  WBC 11.9* 12.1*  NEUTROABS 8.3* 9.4*  HGB 12.1* 11.1*  HCT 37.5* 34.0*  MCV 86.8 85.4  PLT 372 430*    Basic Metabolic Panel: Recent Labs  Lab 04/21/19 1917 04/23/19 0422  NA 135 137  K 4.2 4.2  CL 97* 98  CO2 26 27  GLUCOSE 118* 128*  BUN 16  15  CREATININE 0.70 0.73  CALCIUM 8.5* 8.5*   GFR: Estimated Creatinine Clearance: 107.9 mL/min (by C-G formula based on SCr of 0.73 mg/dL). Recent Labs  Lab 04/21/19 1917 04/21/19 2058 04/22/19 1507 04/23/19 0422  PROCALCITON  --   --  <0.10  --   WBC 11.9*  --   --  12.1*  LATICACIDVEN 1.2 1.0  --   --     Liver Function Tests: Recent Labs  Lab 04/21/19 1917  AST 26  ALT 31  ALKPHOS 83  BILITOT 0.5  PROT 6.8  ALBUMIN 2.8*   No results for input(s): LIPASE, AMYLASE in the last 168 hours. No results for input(s): AMMONIA in the last 168 hours.  ABG No results found for: PHART, PCO2ART, PO2ART, HCO3, TCO2, ACIDBASEDEF, O2SAT   Coagulation Profile: No results for input(s): INR, PROTIME in the last 168 hours.  Cardiac Enzymes: No results for input(s): CKTOTAL, CKMB, CKMBINDEX, TROPONINI in the last 168 hours.  HbA1C: No results found for: HGBA1C  CBG: No results for input(s): GLUCAP in the last 168 hours.   Georgann Housekeeper, AGACNP-BC [place] Pager [phone] or [phone]  04/23/2019 9:57 AM

## 2019-04-23 NOTE — Progress Notes (Signed)
PROGRESS NOTE  Roger Durham JYN:[id number] DOB: [birth year] DOA: 04/21/2019 PCP: Lavone Orn, MD  Brief History    Roger Durham is a 63 y.o. male with medical history significant of hypertension, hyperlipidemia, prostate cancer, hypothyroidism, chronic back pain, chronic pain syndrome on methadone, who presents with shortness of breath, cough, sinus pain.  Patient states that his symptoms have been going on for about 9 days, including sinus pain and pressure, productive cough, shortness of breath and mild pleuritic right-sided chest pain. His symptoms has been progressively worsening.  Patient was seen by PCP on 04/15/2019, and they diagnosed him as pneumonia.  Patient was started on "a cephalosporin" for 5 days.  After completed course of antibiotics, he did not feel better. He saw PCP again on 04/19/2019, and had a chest x-ray, which showed multifocal pneumonia.  Patient was switched to doxycycline.  He has been taking doxycycline in the past 2 days, but no improvement.  He also had COVID swab by his PCP which was negative. Of note,  patient states that when he was gardening roughly 2 days prior to start of his symptoms, he was exposed to a white powdery substance from a bucket of leaves he was cleaning out. He is unsure if these are related to his symptoms. Patient has nausea, no vomiting, diarrhea or abdominal pain.  No symptoms of UTI or unilateral weakness.   ED Course: pt was found to have negative covid 19 in ED, WBC 11.9, lactic acid 1.2, electrolytes renal function okay, pending COVID-19 test, temperature 99.5, blood pressure 146/77, heart rate 82, respiration rate 17, oxygen saturation 88% on room air initially, currently 93 to 97%.  Checks x-ray showed persistent multifocal areas of airspace consolidation throughout the right lung, worse in the right upper lobe.  Patient is admitted to telemetry bed as inpatient.  Patient was admitted to a telemetry bed. He was been started on Flagyl,  Ceftriaxone and Azithromycin. Pulmonology has been consulted. They feel that the patient has pneumonitis and they have discontinued IV antibiotics and started the patient on steroids. They have signed off, but will follow the patient as outpatient.  The patient is now saturating in the 90's at rest. He has been ambulated in the halls. Ambulatory SaO2 did drop to 88% as per nursing's note.  Consultants  . Pulmonology  Procedures  . None  Antibiotics   Anti-infectives (From admission, onward)   Start     Dose/Rate Route Frequency Ordered Stop   04/23/19 1800  cefTRIAXone (ROCEPHIN) 2 g in sodium chloride 0.9 % 100 mL IVPB  Status:  Discontinued     2 g 200 mL/hr over 30 Minutes Intravenous Every 24 hours 04/22/19 2102 04/23/19 1141   04/23/19 1800  azithromycin (ZITHROMAX) 500 mg in sodium chloride 0.9 % 250 mL IVPB  Status:  Discontinued     500 mg 250 mL/hr over 60 Minutes Intravenous Every 24 hours 04/22/19 2102 04/23/19 1141   04/22/19 2300  azithromycin (ZITHROMAX) 500 mg in sodium chloride 0.9 % 250 mL IVPB  Status:  Discontinued     500 mg 250 mL/hr over 60 Minutes Intravenous Every 24 hours 04/21/19 2200 04/22/19 1718   04/22/19 1630  metroNIDAZOLE (FLAGYL) IVPB 500 mg  Status:  Discontinued     500 mg 100 mL/hr over 60 Minutes Intravenous Every 8 hours 04/22/19 1119 04/22/19 1718   04/21/19 2315  metroNIDAZOLE (FLAGYL) tablet 500 mg  Status:  Discontinued     500 mg Oral Every 8  hours 04/21/19 2310 04/22/19 1119   04/21/19 2100  cefTRIAXone (ROCEPHIN) 2 g in sodium chloride 0.9 % 100 mL IVPB  Status:  Discontinued     2 g 200 mL/hr over 30 Minutes Intravenous Every 24 hours 04/21/19 2037 04/22/19 1718   04/21/19 2045  azithromycin (ZITHROMAX) 500 mg in sodium chloride 0.9 % 250 mL IVPB     500 mg 250 mL/hr over 60 Minutes Intravenous  Once 04/21/19 2037 04/21/19 2322     .   Subjective  The patient is awake, alert, and oriented x 3. No acute distress.  Objective    Vitals:  Vitals:   04/23/19 0750 04/23/19 1610  BP: 119/66 110/64  Pulse: 61 80  Resp: 16 16  Temp: 98.3 F (36.8 C) 97.8 F (36.6 C)  SpO2: 98% 91%    Exam:  Constitutional:  . The patient is awake, alert, and oriented x 3. He is in no acute distress. Respiratory:  . There is no increased work of breathing. . No wheezes, rales, or rhonchi are heard. Cardiovascular:  . Regular rate and rhythm. . No peripheral edema Musculoskeletal:  . No cyanosis, clubbing, or edema is apparent. Skin:  . No rashes, lesions, ulcers . palpation of skin: no induration or nodules Neurologic:  . CN 2-12 intact . Sensation all 4 extremities intact Psychiatric:  . Mental status o Mood, affect appropriate o Orientation to person, place, time  . judgment and insight appear intact    I have personally reviewed the following:   Today's Data  . Presenter, broadcasting  . Blood cultures are pending.  Scheduled Meds: . amLODipine  5 mg Oral Daily   And  . benazepril  10 mg Oral Daily  . aspirin  81 mg Oral Daily  . atorvastatin  10 mg Oral QHS  . enoxaparin (LOVENOX) injection  40 mg Subcutaneous Q24H  . levothyroxine  112 mcg Oral Q0600  . methadone  10 mg Oral TID  . [START ON 04/24/2019] predniSONE  40 mg Oral Q breakfast   Followed by  . [START ON 04/30/2019] predniSONE  20 mg Oral Q breakfast   Followed by  . [START ON 05/07/2019] predniSONE  10 mg Oral Q breakfast   Continuous Infusions:   Principal Problem:   Multifocal pneumonia Active Problems:   Hypothyroidism   HTN (hypertension)   HLD (hyperlipidemia)   Acute respiratory failure with hypoxia (HCC)   Chronic pain syndrome   LOS: 2 days   A & P  Acute respiratory failure with hypoxia: The patient is currently saturating at 91% at rest on room air.   Multifocal pneumonia: Per pulmonology more likely pneumonitis. Procalcitonins was negative. They have started the patient on a steroids taper. Pt CXR demonstrates  persistent multifocal areas of airspace disease. He has failed a 5 day course of antibiotics. The patient is concerned that he could have developed his dyspnea in response to inhaling and irritant.   Has mild leukocytosis with WBC 11.9, low grade temperature 99.5. Clinically did not meet criteria for sepsis upon admission. He was admitted as inpatient on IV Rocephin and Azithromycin. This has now been stopped.  He was also given flagyl for a potential aspiration pneumonia. It has also been discontinued. He will receive Mucinex for cough. Atrovent and albuterol inhaler. Urine legionella and S. Pneumococcal antigen have been checked and were negative. Strep pneumo was negative. COVID-19 negative, Flu is negative, and Respiratory viral panel is negative. SLP eval demonstrated  no concerns about aspiration.   Hypothyroidism: Synthroid  HTN (hypertension): Blood pressures are controlled on home doses of benazepril and amlodipine.   HLD (hyperlipidemia): lipitor  Chronic pain syndrome and chronic back pain:Continue home methadone  I have seen and examined this patient myself. I have spent 52 minutes in his evaluation and care. I have spent more than 50% of the time in counseling the patient and his daughter. All questions answered to the best of my ability.  DVT prophylaxis: Lovenox Code Status: Full Code Family Communication: The patient's wife is present at bedside. Disposition Plan: home  Nakiya Rallis, DO [facility] Direct contact: see [email or web address]  7PM-7AM contact night coverage as above 04/23/2019, 5:29 PM  LOS: 1 day

## 2019-04-24 MED ORDER — ALBUTEROL SULFATE (2.5 MG/3ML) 0.083% IN NEBU: 3.0000 mL | INHALATION_SOLUTION | RESPIRATORY_TRACT | Status: DC | PRN

## 2019-04-24 MED ORDER — IPRATROPIUM-ALBUTEROL 20-100 MCG/ACT IN AERS
1.0000 | INHALATION_SPRAY | Freq: Four times a day (QID) | RESPIRATORY_TRACT | Status: DC
  Filled 2019-04-24: qty 4

## 2019-04-24 MED ORDER — IPRATROPIUM-ALBUTEROL 0.5-2.5 (3) MG/3ML IN SOLN: 3.0000 mL | Freq: Four times a day (QID) | RESPIRATORY_TRACT | Status: DC | PRN

## 2019-04-24 MED ORDER — IPRATROPIUM-ALBUTEROL 20-100 MCG/ACT IN AERS
1.0000 | INHALATION_SPRAY | Freq: Two times a day (BID) | RESPIRATORY_TRACT | Status: DC
  Filled 2019-04-24: qty 4

## 2019-04-24 MED ORDER — AEROCHAMBER Z-STAT PLUS/MEDIUM MISC
1.0000 | Freq: Once | Status: DC
  Filled 2019-04-24: qty 1

## 2019-04-24 NOTE — Progress Notes (Signed)
Patient Saturations on Room Air at Rest = 93%  Patient Saturations on Room Air while Ambulating = 86%  Patient ambulated 75 feet in hallway.

## 2019-04-24 NOTE — Telephone Encounter (Signed)
Pt still admitted in hospital.

## 2019-04-24 NOTE — Progress Notes (Signed)
PROGRESS NOTE  Roger Durham [id number] DOB: 04-06-1956 DOA: 04/21/2019 PCP: Lavone Orn, MD  Brief History    Roger Durham is a 63 y.o. male with medical history significant of hypertension, hyperlipidemia, prostate cancer, hypothyroidism, chronic back pain, chronic pain syndrome on methadone, who presents with shortness of breath, cough, sinus pain.  Patient states that his symptoms have been going on for about 9 days, including sinus pain and pressure, productive cough, shortness of breath and mild pleuritic right-sided chest pain. His symptoms has been progressively worsening.  Patient was seen by PCP on 04/15/2019, and they diagnosed him as pneumonia.  Patient was started on "a cephalosporin" for 5 days.  After completed course of antibiotics, he did not feel better. He saw PCP again on 04/19/2019, and had a chest x-ray, which showed multifocal pneumonia.  Patient was switched to doxycycline.  He has been taking doxycycline in the past 2 days, but no improvement.  He also had COVID swab by his PCP which was negative. Of note,  patient states that when he was gardening roughly 2 days prior to start of his symptoms, he was exposed to a white powdery substance from a bucket of leaves he was cleaning out. He is unsure if these are related to his symptoms. Patient has nausea, no vomiting, diarrhea or abdominal pain.  No symptoms of UTI or unilateral weakness.   ED Course: pt was found to have negative covid 19 in ED, WBC 11.9, lactic acid 1.2, electrolytes renal function okay, pending COVID-19 test, temperature 99.5, blood pressure 146/77, heart rate 82, respiration rate 17, oxygen saturation 88% on room air initially, currently 93 to 97%.  Checks x-ray showed persistent multifocal areas of airspace consolidation throughout the right lung, worse in the right upper lobe.  Patient is admitted to telemetry bed as inpatient.  Patient was admitted to a telemetry bed. He was been started on Flagyl,  Ceftriaxone and Azithromycin. Pulmonology has been consulted. They feel that the patient has pneumonitis and they have discontinued IV antibiotics and started the patient on steroids. They have signed off, but will follow the patient as outpatient.  The patient is now saturating in the 90's at rest. He has been ambulated in the halls. Ambulatory SaO2 did drop to 86% as per nursing's note from 93% at rest.   Consultants  . Pulmonology  Procedures  . None  Antibiotics   Anti-infectives (From admission, onward)   Start     Dose/Rate Route Frequency Ordered Stop   04/23/19 1800  cefTRIAXone (ROCEPHIN) 2 g in sodium chloride 0.9 % 100 mL IVPB  Status:  Discontinued     2 g 200 mL/hr over 30 Minutes Intravenous Every 24 hours 04/22/19 2102 04/23/19 1141   04/23/19 1800  azithromycin (ZITHROMAX) 500 mg in sodium chloride 0.9 % 250 mL IVPB  Status:  Discontinued     500 mg 250 mL/hr over 60 Minutes Intravenous Every 24 hours 04/22/19 2102 04/23/19 1141   04/22/19 2300  azithromycin (ZITHROMAX) 500 mg in sodium chloride 0.9 % 250 mL IVPB  Status:  Discontinued     500 mg 250 mL/hr over 60 Minutes Intravenous Every 24 hours 04/21/19 2200 04/22/19 1718   04/22/19 1630  metroNIDAZOLE (FLAGYL) IVPB 500 mg  Status:  Discontinued     500 mg 100 mL/hr over 60 Minutes Intravenous Every 8 hours 04/22/19 1119 04/22/19 1718   04/21/19 2315  metroNIDAZOLE (FLAGYL) tablet 500 mg  Status:  Discontinued  500 mg Oral Every 8 hours 04/21/19 2310 04/22/19 1119   04/21/19 2100  cefTRIAXone (ROCEPHIN) 2 g in sodium chloride 0.9 % 100 mL IVPB  Status:  Discontinued     2 g 200 mL/hr over 30 Minutes Intravenous Every 24 hours 04/21/19 2037 04/22/19 1718   04/21/19 2045  azithromycin (ZITHROMAX) 500 mg in sodium chloride 0.9 % 250 mL IVPB     500 mg 250 mL/hr over 60 Minutes Intravenous  Once 04/21/19 2037 04/21/19 2322     .   Subjective  The patient is awake, alert, and oriented x 3. No acute distress.   Objective   Vitals:  Vitals:   04/23/19 2300 04/24/19 0750  BP: 110/72 120/72  Pulse: 72 73  Resp: 20 18  Temp: 98.5 F (36.9 C) 98.6 F (37 C)  SpO2: 90% 92%    Exam:  Constitutional:  . The patient is awake, alert, and oriented x 3. He is in no acute distress. Respiratory:  . There is mild increased work of breathing. . No wheezes, rales, or rhonchi are heard. . Diminished breath sounds bilaterally. Cardiovascular:  . Regular rate and rhythm. . No peripheral edema Musculoskeletal:  . No cyanosis, clubbing, or edema is apparent. Skin:  . No rashes, lesions, ulcers . palpation of skin: no induration or nodules Neurologic:  . CN 2-12 intact . Sensation all 4 extremities intact Psychiatric:  . Mental status o Mood, affect appropriate o Orientation to person, place, time  . judgment and insight appear intact    I have personally reviewed the following:   Today's Data  . Presenter, broadcasting  . Blood cultures have had no growth.  Scheduled Meds: . aerochamber Z-Stat Plus/medium  1 each Other Once  . amLODipine  5 mg Oral Daily   And  . benazepril  10 mg Oral Daily  . aspirin  81 mg Oral Daily  . atorvastatin  10 mg Oral QHS  . enoxaparin (LOVENOX) injection  40 mg Subcutaneous Q24H  . levothyroxine  112 mcg Oral Q0600  . methadone  10 mg Oral TID  . predniSONE  40 mg Oral Q breakfast   Followed by  . [START ON 04/30/2019] predniSONE  20 mg Oral Q breakfast   Followed by  . [START ON 05/07/2019] predniSONE  10 mg Oral Q breakfast   Continuous Infusions:   Principal Problem:   Multifocal pneumonia Active Problems:   Hypothyroidism   HTN (hypertension)   HLD (hyperlipidemia)   Acute respiratory failure with hypoxia (HCC)   Chronic pain syndrome   LOS: 3 days   A & P  Acute respiratory failure with hypoxia: The patient is currently saturating at 91% at rest on room air. With ambulation he drops to 86%. Continue to monitor.  Multifocal pneumonia:  Per pulmonology more likely pneumonitis. Procalcitonins was negative. They have started the patient on a steroids taper. Pt CXR demonstrates persistent multifocal areas of airspace disease. He has failed a 5 day course of antibiotics. The patient is concerned that he could have developed his dyspnea in response to inhaling and irritant.   Has mild leukocytosis with WBC 11.9, low grade temperature 99.5. Clinically did not meet criteria for sepsis upon admission. He was admitted as inpatient on IV Rocephin and Azithromycin. This has now been stopped.  He was also given flagyl for a potential aspiration pneumonia. It has also been discontinued. He will receive Mucinex for cough. Atrovent and albuterol inhaler. Urine legionella and  S. Pneumococcal antigen have been checked and were negative. Strep pneumo was negative. COVID-19 negative, Flu is negative, and Respiratory viral panel is negative. SLP eval demonstrated no concerns about aspiration.   Hypothyroidism: Synthroid  HTN (hypertension): Blood pressures are controlled on home doses of benazepril and amlodipine.   HLD (hyperlipidemia): lipitor  Chronic pain syndrome and chronic back pain:Continue home methadone  I have seen and examined this patient myself. I have spent 35 minutes in his evaluation and care.  DVT prophylaxis: Lovenox Code Status: Full Code Family Communication: The patient's wife is present at bedside. Disposition Plan: home  Reita Shindler, DO [facility] Direct contact: see [email or web address]  7PM-7AM contact night coverage as above 04/24/2019, 1:50 PM  LOS: 1 day

## 2019-04-25 ENCOUNTER — Inpatient Hospital Stay (HOSPITAL_COMMUNITY): Payer: No Typology Code available for payment source

## 2019-04-25 LAB — CULTURE, RESPIRATORY W GRAM STAIN: Culture: NORMAL

## 2019-04-25 NOTE — Progress Notes (Signed)
PROGRESS NOTE  Roger Durham OMV:[id number] DOB: 10/08/1955 DOA: 04/21/2019 PCP: Lavone Orn, MD  Brief History    Roger Durham is a 63 y.o. male with medical history significant of hypertension, hyperlipidemia, prostate cancer, hypothyroidism, chronic back pain, chronic pain syndrome on methadone, who presents with shortness of breath, cough, sinus pain.  Patient states that his symptoms have been going on for about 9 days, including sinus pain and pressure, productive cough, shortness of breath and mild pleuritic right-sided chest pain. His symptoms has been progressively worsening.  Patient was seen by PCP on 04/15/2019, and they diagnosed him as pneumonia.  Patient was started on "a cephalosporin" for 5 days.  After completed course of antibiotics, he did not feel better. He saw PCP again on 04/19/2019, and had a chest x-ray, which showed multifocal pneumonia.  Patient was switched to doxycycline.  He has been taking doxycycline in the past 2 days, but no improvement.  He also had COVID swab by his PCP which was negative. Of note,  patient states that when he was gardening roughly 2 days prior to start of his symptoms, he was exposed to a white powdery substance from a bucket of leaves he was cleaning out. He is unsure if these are related to his symptoms. Patient has nausea, no vomiting, diarrhea or abdominal pain.  No symptoms of UTI or unilateral weakness.   ED Course: pt was found to have negative covid 19 in ED, WBC 11.9, lactic acid 1.2, electrolytes renal function okay, pending COVID-19 test, temperature 99.5, blood pressure 146/77, heart rate 82, respiration rate 17, oxygen saturation 88% on room air initially, currently 93 to 97%.  Checks x-ray showed persistent multifocal areas of airspace consolidation throughout the right lung, worse in the right upper lobe.  Patient is admitted to telemetry bed as inpatient.  Patient was admitted to a telemetry bed. He was been started on Flagyl,  Ceftriaxone and Azithromycin. Pulmonology has been consulted. They feel that the patient has pneumonitis and they have discontinued IV antibiotics and started the patient on steroids. They have signed off, but will follow the patient as outpatient.  The patient is now saturating in the 90's at rest. He has been ambulated in the halls. Ambulatory SaO2 did drop to 86% as per nursing's note from 93% at rest on 04/24/2019. On 04/25/2019, the patient was hypoxic at rest this morning and required 2L to ambulate and maintaining 92% O2 saturations.   Consultants   Pulmonology  Procedures   None  Antibiotics   Anti-infectives (From admission, onward)   Start     Dose/Rate Route Frequency Ordered Stop   04/23/19 1800  cefTRIAXone (ROCEPHIN) 2 g in sodium chloride 0.9 % 100 mL IVPB  Status:  Discontinued     2 g 200 mL/hr over 30 Minutes Intravenous Every 24 hours 04/22/19 2102 04/23/19 1141   04/23/19 1800  azithromycin (ZITHROMAX) 500 mg in sodium chloride 0.9 % 250 mL IVPB  Status:  Discontinued     500 mg 250 mL/hr over 60 Minutes Intravenous Every 24 hours 04/22/19 2102 04/23/19 1141   04/22/19 2300  azithromycin (ZITHROMAX) 500 mg in sodium chloride 0.9 % 250 mL IVPB  Status:  Discontinued     500 mg 250 mL/hr over 60 Minutes Intravenous Every 24 hours 04/21/19 2200 04/22/19 1718   04/22/19 1630  metroNIDAZOLE (FLAGYL) IVPB 500 mg  Status:  Discontinued     500 mg 100 mL/hr over 60 Minutes Intravenous Every 8 hours  04/22/19 1119 04/22/19 1718   04/21/19 2315  metroNIDAZOLE (FLAGYL) tablet 500 mg  Status:  Discontinued     500 mg Oral Every 8 hours 04/21/19 2310 04/22/19 1119   04/21/19 2100  cefTRIAXone (ROCEPHIN) 2 g in sodium chloride 0.9 % 100 mL IVPB  Status:  Discontinued     2 g 200 mL/hr over 30 Minutes Intravenous Every 24 hours 04/21/19 2037 04/22/19 1718   04/21/19 2045  azithromycin (ZITHROMAX) 500 mg in sodium chloride 0.9 % 250 mL IVPB     500 mg 250 mL/hr over 60 Minutes  Intravenous  Once 04/21/19 2037 04/21/19 2322        Subjective  The patient is awake, alert, and oriented x 3. No acute distress.  Objective   Vitals:  Vitals:   04/25/19 1050 04/25/19 1056  BP:    Pulse:    Resp:    Temp:    SpO2: 92% 96%    Exam:  Constitutional:   The patient is awake, alert, and oriented x 3. He is in no acute distress. Respiratory:   There is no increased work of breathing.  No wheezes, rales, or rhonchi are heard.  Diminished breath sounds bilaterally. Cardiovascular:   Regular rate and rhythm.  No peripheral edema Musculoskeletal:   No cyanosis, clubbing, or edema is apparent. Skin:   No rashes, lesions, ulcers  palpation of skin: no induration or nodules Neurologic:   CN 2-12 intact  Sensation all 4 extremities intact Psychiatric:   Mental status o Mood, affect appropriate o Orientation to person, place, time   judgment and insight appear intact    I have personally reviewed the following:   Today's Data   Vitals   Micro Data   Blood cultures have had no growth.  Scheduled Meds:  aerochamber Z-Stat Plus/medium  1 each Other Once   amLODipine  5 mg Oral Daily   And   benazepril  10 mg Oral Daily   aspirin  81 mg Oral Daily   atorvastatin  10 mg Oral QHS   enoxaparin (LOVENOX) injection  40 mg Subcutaneous Q24H   Ipratropium-Albuterol  1 puff Inhalation BID   levothyroxine  112 mcg Oral Q0600   methadone  10 mg Oral TID   predniSONE  40 mg Oral Q breakfast   Followed by   Derrill Memo ON 04/30/2019] predniSONE  20 mg Oral Q breakfast   Followed by   Derrill Memo ON 05/07/2019] predniSONE  10 mg Oral Q breakfast   Continuous Infusions:   Principal Problem:   Multifocal pneumonia Active Problems:   Hypothyroidism   HTN (hypertension)   HLD (hyperlipidemia)   Acute respiratory failure with hypoxia (HCC)   Chronic pain syndrome   LOS: 4 days   A & P  Acute respiratory failure with hypoxia: Worsening  O2 saturations. I have re-consulted pulmonology to re-evaluate. pCXR ordered.  Multifocal pneumonia/pneumonitis: Per pulmonology more likely pneumonitis. Pt appears to be worsening. Pulmonology has been reconsulted. Procalcitonin was negative on 04/22/2019. They have started the patient on a steroid taper. Pt CXR demonstrates persistent multifocal areas of airspace disease. He has failed a 5 day course of antibiotics as outpatient. The patient is concerned that he could have developed his dyspnea in response to inhaling and irritant. The patient has leukocytosis possibly due to steroids. He was admitted as inpatient on IV Rocephin and Azithromycin. He was also given flagyl for a potential aspiration pneumonia. This has now been discontinued. Urine legionella and S. Pneumococcal  antigen have been checked and were negative. Strep pneumo was negative. COVID-19 negative, Flu is negative, and Respiratory viral panel is negative.He will receive Mucinex for cough, Atrovent and albuterol inhaler.  SLP eval demonstrated no concerns about aspiration. Initially the patient seemed to improve on steroids, however his hypoxemia seems to have worsened in the past 2 days. Pulmonology has been re-consulted.  Hypothyroidism: Synthroid  HTN (hypertension): Blood pressures are controlled on home doses of benazepril and amlodipine.   HLD (hyperlipidemia): lipitor  Chronic pain syndrome and chronic back pain:Continue home methadone  I have seen and examined this patient myself. I have spent 38 minutes in his evaluation and care.  DVT prophylaxis: Lovenox Code Status: Full Code Family Communication: None available. Disposition Plan: home  Lella Mullany, DO [facility] Direct contact: see [email or web address]  7PM-7AM contact night coverage as above 04/25/2019, 11:57 AM  LOS: 1 day

## 2019-04-25 NOTE — Progress Notes (Signed)
NAMELEITH Durham, MRN:  [id number], DOB:  Feb 11, 1956, LOS: 4 ADMISSION DATE:  04/21/2019, CONSULTATION DATE:  8/10 REFERRING MD:  Dr. Benny Lennert TRH, CHIEF COMPLAINT:  SOB   History of present illness   63 year old male with PMH as below, which is significant for HTN, Hypothyroid, neuromuscular disorder, and chronic back pain on methadone. He is a retired Immunologist. He was in his usual state of health until the beginning of August when he worked with some brush in his back yard and inhaled what he belived to be a white power from within the brush. Since that time he has had progressive sinus pressure, dyspnea, and pleuritic chest pain with associated fevers. He also had cough productive for yellow tinged sputum. He presented with these complaints to his PCP on 8/3 and was prescribed a cephalosporin and again 8/7 after feeling worse and was given doxycycline. Then 8/9 with worsening symptoms he presented to the ED. In ED he was hypoxemic on room air requiring supplemental O2. CXR demonstrated multifocal airspace consolidation most notable in the RUL. He was admitted to the hospitalist service and treated for CAP with IV antibiotics. He requested pulmonary consultation 8/10.   Past Medical History   has a past medical history of Cancer ([place]), Chronic back pain, Hematuria, Hypertension, Hypothyroidism, Neuromuscular disorder ([place]), Prostate cancer ([place]), and Seasonal allergies.  Significant Hospital Events   8/9 admit for presumed CAP 8/10 started on prednisone. abx stopped. Room air sats mid 90s 8/11->8/13. Feeling about same but daily room air resting pulse ox worsening. CXR w/ on-going right sided interstitial and intraalveolar airspace disease. PCCm asked to re-eval on 8/13. CT chest and sinus ordered. HSP and fungal AB panels sent.  Consults:  PCCM 8/10  Procedures:    Significant Diagnostic Tests:  CT chest 8/13>>> CT sinus 8/13>>> HSP panel 8/13>>>  Micro Data:  Blood 8/9 >neg  Sputum 8/10 >  NOF RVP 8/9 > neg Fungal antibody test 8/13>>>  Antimicrobials:  Ceftriaxone 8/10 >off Azithromycin 8/10 >off  Interim history/subjective:  Feels about the same   Objective   Blood pressure 140/77, pulse 69, temperature 98.7 F (37.1 C), resp. rate 18, height 5\' 9"  (1.753 m), weight 98.4 kg, SpO2 90 %.        Intake/Output Summary (Last 24 hours) at 04/25/2019 1404 Last data filed at 04/24/2019 2248 Gross per 24 hour  Intake 480 ml  Output no documentation  Net 480 ml   Filed Weights   04/22/19 0036  Weight: 98.4 kg    Examination:  General pleasant 63 year old male resting in bed.  HENT NCAT no JVD MMM pulm clear no accessory use. Room air resting sats 87%  Card RRR  abd not tender + bowel sounds EXT brisk cr no edema  Neuro intact   Resolved Hospital Problem list     Assessment & Plan:   Acute hypoxemic respiratory failure in setting of predominantly right sided interstitial and intraalveolar infiltrates of unclear etiology : initial concern was for bacterial process, however, minimal improvement on ABX and PCT negative. Started on steroids 8/10 with subjective improvement, but now objectively oxygen saturation worse (as low as 81 % room air; was mid 90s room air at rest).   - ABX off since 8/9 and 8/10.  Plan Cont supplemental oxygen Send fungal antibody panel as well as HSP panel CT (high resolution of chest) to better define interstitial changes.  May need FOB for BAL and perhaps may need  to consider Open lung biopsy.  Would not change pred dosing for now Would hold off from abx for now   Sinusitis - ABX off. -pressure is worse  Plan CT sinus.   Uncertain COPD history: carries diagnosis, but does not believe he has ever had PFT. Has a 5 pack year history and quit in 2011. Plan Cont duoneb Needs PFTs at some point after above resolved.   Remainder per primary service.   Erick Colace ACNP-BC [place] Pager # [phone] OR #  [phone] if no answer    Erick Colace ACNP-BC [place] Pager # [phone] OR # [phone] if no answer  04/25/2019 2:04 PM

## 2019-04-25 NOTE — Progress Notes (Signed)
Patient Saturations on Room Air at Rest = 81%  Patient Saturations on 2L at rest = 87%  Patient Saturations on 4L at rest = 96%  Patient Saturations on 4 Liters of oxygen while Ambulating = 96%  Patient Saturations on 2L of oxygen while ambulating = 92%

## 2019-04-25 NOTE — Progress Notes (Signed)
Pt complaining that his IV in RAC is sore. IV removed. No redness or drainage noted. Pt declines a new IV site stating "all my meds are po". [place] notified.

## 2019-04-26 LAB — CBC WITH DIFFERENTIAL/PLATELET
Abs Immature Granulocytes: 0.09 10*3/uL — ABNORMAL HIGH (ref 0.00–0.07)
Basophils Absolute: 0.2 10*3/uL — ABNORMAL HIGH (ref 0.0–0.1)
Basophils Relative: 1 %
Eosinophils Absolute: 0.4 10*3/uL (ref 0.0–0.5)
Eosinophils Relative: 4 %
HCT: 33.8 % — ABNORMAL LOW (ref 39.0–52.0)
Hemoglobin: 11 g/dL — ABNORMAL LOW (ref 13.0–17.0)
Immature Granulocytes: 1 %
Lymphocytes Relative: 21 %
Lymphs Abs: 2.3 10*3/uL (ref 0.7–4.0)
MCH: 27.9 pg (ref 26.0–34.0)
MCHC: 32.5 g/dL (ref 30.0–36.0)
MCV: 85.8 fL (ref 80.0–100.0)
Monocytes Absolute: 1 10*3/uL (ref 0.1–1.0)
Monocytes Relative: 9 %
Neutro Abs: 7.2 10*3/uL (ref 1.7–7.7)
Neutrophils Relative %: 64 %
Platelets: 477 10*3/uL — ABNORMAL HIGH (ref 150–400)
RBC: 3.94 MIL/uL — ABNORMAL LOW (ref 4.22–5.81)
RDW: 14.6 % (ref 11.5–15.5)
WBC: 11.2 10*3/uL — ABNORMAL HIGH (ref 4.0–10.5)
nRBC: 0 % (ref 0.0–0.2)

## 2019-04-26 LAB — BASIC METABOLIC PANEL
Anion gap: 9 (ref 5–15)
BUN: 21 mg/dL (ref 8–23)
CO2: 29 mmol/L (ref 22–32)
Calcium: 8.5 mg/dL — ABNORMAL LOW (ref 8.9–10.3)
Chloride: 104 mmol/L (ref 98–111)
Creatinine, Ser: 0.77 mg/dL (ref 0.61–1.24)
GFR calc Af Amer: >60 mL/min (ref >60)
GFR calc non Af Amer: >60 mL/min (ref >60)
Glucose, Bld: 97 mg/dL (ref 70–99)
Potassium: 4.5 mmol/L (ref 3.5–5.1)
Sodium: 142 mmol/L (ref 135–145)

## 2019-04-26 NOTE — Telephone Encounter (Signed)
Pt still in the hospital.

## 2019-04-26 NOTE — Progress Notes (Signed)
PROGRESS NOTE    Roger Durham  PPJ:[id number] DOB: 1956-04-12 DOA: 04/21/2019 PCP: Lavone Orn, MD     Brief Narrative:  Roger Durham is a 63 y.o.malewith medical history significant ofhypertension, hyperlipidemia, prostate cancer, hypothyroidism, chronic back pain,chronic pain syndrome onmethadone,who presents with shortness of breath, cough, sinus pain.  Patient states that his symptoms have been going on for about 9 days, including sinus pain and pressure, productive cough, shortness of breathand mildpleuritic right-sided chest pain. His symptoms has been progressively worsening.Patient was seen by PCP on 04/15/2019, and they diagnosed him as pneumonia. Patient was started on "a cephalosporin"for 5 days. After completed course of antibiotics, he did not feel better.He saw PCP again on 04/19/2019, and had a chest x-ray, which showed multifocal pneumonia. Patient was switched to doxycycline. He has been taking doxycycline in the past 2 days, but no improvement. He also hadCOVID swab by his PCP which was negative. Of note,patient states that when he was gardening roughly 2 days prior to start of his symptoms, he was exposed to a white powdery substance from a bucket of leaves he was cleaning out. He is unsure if these are related to his symptoms.  Checks x-ray showed persistent multifocal areas of airspace consolidation throughout the right lung, worse in the right upper lobe.Patient is admitted to telemetry bed as inpatient. He was been started on Flagyl, Ceftriaxone and Azithromycin. Pulmonology has been consulted. They feel that the patient has pneumonitis and they have discontinued IV antibiotics and started the patient on steroids. Due to recurrent desaturations, pulmonology was re-consulted and patient underwent CT chest and CT maxillofacial.   New events last 24 hours / Subjective: Off O2 today.  No worsening shortness of breath this morning.  Assessment & Plan:     Principal Problem:   Multifocal pneumonia Active Problems:   Hypothyroidism   HTN (hypertension)   HLD (hyperlipidemia)   Acute respiratory failure with hypoxia (HCC)   Chronic pain syndrome    Acute hypoxemic respiratory failure -Remains off of oxygen this morning  Multifocal pneumonia/pneumonitis -Patient initially treated with IV Rocephin, azithromycin as well as Flagyl.  He was evaluated for aspiration by SLP, demonstrated no concerns for aspiration.  Influenza PCR, respiratory panel PCR, COVID-19, HIV, strep pneumo urinary antigen, Legionella antigen were all negative.  Respiratory culture with normal respiratory flora.  Antibiotics were stopped.  Patient started on steroids. -Appreciate PCCM consultation -CT chest revealed extensive patchy consolidation right upper and middle lobes, extensive patchy groundglass opacity right lung.  Findings most compatible with multilobar pneumonia.  No compelling evidence for ILD.  Left lower lobe 6 mm pulmonary nodule.  Mild mediastinal and right hilar lymphadenopathy. -PCCM recommended patient remain off of antibiotics, start steroid taper.  They will plan to follow-up with a repeat CT as an outpatient in approximately 6 weeks as well as PFT.  They will follow-up with on fungal antibody panel, hypersensitivity panel.  May consider bronchoscopy ending clinical course as an outpatient  Incidental finding of pituitary mass -Brain MRI pending  Hypothyroidism -Diagnosed several years ago, currently on Synthroid  Essential hypertension -Continue amlodipine, benazepril  Hyperlipidemia -Continue Lipitor  Chronic pain syndrome, chronic back pain -Continue methadone     DVT prophylaxis: Lovenox Code Status: Full Family Communication: None Disposition Plan: Pending further work-up, clinical improvement.  Plan for discharge next 24 to 48 hours   Consultants:   PCCM  Procedures:   None  Antimicrobials:  Anti-infectives (From  admission, onward)   Start  Dose/Rate Route Frequency Ordered Stop   04/23/19 1800  cefTRIAXone (ROCEPHIN) 2 g in sodium chloride 0.9 % 100 mL IVPB  Status:  Discontinued     2 g 200 mL/hr over 30 Minutes Intravenous Every 24 hours 04/22/19 2102 04/23/19 1141   04/23/19 1800  azithromycin (ZITHROMAX) 500 mg in sodium chloride 0.9 % 250 mL IVPB  Status:  Discontinued     500 mg 250 mL/hr over 60 Minutes Intravenous Every 24 hours 04/22/19 2102 04/23/19 1141   04/22/19 2300  azithromycin (ZITHROMAX) 500 mg in sodium chloride 0.9 % 250 mL IVPB  Status:  Discontinued     500 mg 250 mL/hr over 60 Minutes Intravenous Every 24 hours 04/21/19 2200 04/22/19 1718   04/22/19 1630  metroNIDAZOLE (FLAGYL) IVPB 500 mg  Status:  Discontinued     500 mg 100 mL/hr over 60 Minutes Intravenous Every 8 hours 04/22/19 1119 04/22/19 1718   04/21/19 2315  metroNIDAZOLE (FLAGYL) tablet 500 mg  Status:  Discontinued     500 mg Oral Every 8 hours 04/21/19 2310 04/22/19 1119   04/21/19 2100  cefTRIAXone (ROCEPHIN) 2 g in sodium chloride 0.9 % 100 mL IVPB  Status:  Discontinued     2 g 200 mL/hr over 30 Minutes Intravenous Every 24 hours 04/21/19 2037 04/22/19 1718   04/21/19 2045  azithromycin (ZITHROMAX) 500 mg in sodium chloride 0.9 % 250 mL IVPB     500 mg 250 mL/hr over 60 Minutes Intravenous  Once 04/21/19 2037 04/21/19 2322        Objective: Vitals:   04/25/19 1641 04/26/19 0001 04/26/19 0817 04/26/19 1300  BP: 127/74 116/63 125/69   Pulse: 72 75 89   Resp: 18  17   Temp: 98.4 F (36.9 C) 98.6 F (37 C) 98.8 F (37.1 C)   TempSrc:  Oral Oral   SpO2: 94% 92% (!) 89% 93%  Weight:      Height:       No intake or output data in the 24 hours ending 04/26/19 1306 Filed Weights   04/22/19 0036  Weight: 98.4 kg    Examination:  General exam: Appears calm and comfortable  Respiratory system: Clear to auscultation. Respiratory effort normal. No respiratory distress. No conversational dyspnea.   On room air  Cardiovascular system: S1 & S2 heard, RRR. No murmurs. No pedal edema. Gastrointestinal system: Abdomen is nondistended, soft and nontender. Normal bowel sounds heard. Central nervous system: Alert and oriented. No focal neurological deficits. Speech clear.  Extremities: Symmetric in appearance  Skin: No rashes, lesions or ulcers on exposed skin  Psychiatry: Judgement and insight appear normal. Mood & affect appropriate.   Data Reviewed: I have personally reviewed following labs and imaging studies  CBC: Recent Labs  Lab 04/21/19 1917 04/23/19 0422 04/26/19 0716  WBC 11.9* 12.1* 11.2*  NEUTROABS 8.3* 9.4* 7.2  HGB 12.1* 11.1* 11.0*  HCT 37.5* 34.0* 33.8*  MCV 86.8 85.4 85.8  PLT 372 430* 229*   Basic Metabolic Panel: Recent Labs  Lab 04/21/19 1917 04/23/19 0422 04/26/19 0716  NA 135 137 142  K 4.2 4.2 4.5  CL 97* 98 104  CO2 26 27 29   GLUCOSE 118* 128* 97  BUN 16 15 21   CREATININE 0.70 0.73 0.77  CALCIUM 8.5* 8.5* 8.5*   GFR: Estimated Creatinine Clearance: 107.9 mL/min (by C-G formula based on SCr of 0.77 mg/dL). Liver Function Tests: Recent Labs  Lab 04/21/19 1917  AST 26  ALT 31  ALKPHOS  83  BILITOT 0.5  PROT 6.8  ALBUMIN 2.8*   No results for input(s): LIPASE, AMYLASE in the last 168 hours. No results for input(s): AMMONIA in the last 168 hours. Coagulation Profile: No results for input(s): INR, PROTIME in the last 168 hours. Cardiac Enzymes: No results for input(s): CKTOTAL, CKMB, CKMBINDEX, TROPONINI in the last 168 hours. BNP (last 3 results) No results for input(s): PROBNP in the last 8760 hours. HbA1C: No results for input(s): HGBA1C in the last 72 hours. CBG: No results for input(s): GLUCAP in the last 168 hours. Lipid Profile: No results for input(s): CHOL, HDL, LDLCALC, TRIG, CHOLHDL, LDLDIRECT in the last 72 hours. Thyroid Function Tests: No results for input(s): TSH, T4TOTAL, FREET4, T3FREE, THYROIDAB in the last 72  hours. Anemia Panel: No results for input(s): VITAMINB12, FOLATE, FERRITIN, TIBC, IRON, RETICCTPCT in the last 72 hours. Sepsis Labs: Recent Labs  Lab 04/21/19 1917 04/21/19 2058 04/22/19 1507  PROCALCITON  --   --  <0.10  LATICACIDVEN 1.2 1.0  --     Recent Results (from the past 240 hour(s))  SARS Coronavirus 2 [facility] order, Performed in [facility] hospital lab) Nasopharyngeal Nasopharyngeal Swab     Status: None   Collection Time: 04/21/19  8:58 PM   Specimen: Nasopharyngeal Swab  Result Value Ref Range Status   SARS Coronavirus 2 NEGATIVE NEGATIVE Final    Comment: (NOTE) If result is NEGATIVE SARS-CoV-2 target nucleic acids are NOT DETECTED. The SARS-CoV-2 RNA is generally detectable in upper and lower  respiratory specimens during the acute phase of infection. The lowest  concentration of SARS-CoV-2 viral copies this assay can detect is 250  copies / mL. A negative result does not preclude SARS-CoV-2 infection  and should not be used as the sole basis for treatment or other  patient management decisions.  A negative result may occur with  improper specimen collection / handling, submission of specimen other  than nasopharyngeal swab, presence of viral mutation(s) within the  areas targeted by this assay, and inadequate number of viral copies  (<250 copies / mL). A negative result must be combined with clinical  observations, patient history, and epidemiological information. If result is POSITIVE SARS-CoV-2 target nucleic acids are DETECTED. The SARS-CoV-2 RNA is generally detectable in upper and lower  respiratory specimens dur ing the acute phase of infection.  Positive  results are indicative of active infection with SARS-CoV-2.  Clinical  correlation with patient history and other diagnostic information is  necessary to determine patient infection status.  Positive results do  not rule out bacterial infection or co-infection with other viruses. If result is  PRESUMPTIVE POSTIVE SARS-CoV-2 nucleic acids MAY BE PRESENT.   A presumptive positive result was obtained on the submitted specimen  and confirmed on repeat testing.  While 2019 novel coronavirus  (SARS-CoV-2) nucleic acids may be present in the submitted sample  additional confirmatory testing may be necessary for epidemiological  and / or clinical management purposes  to differentiate between  SARS-CoV-2 and other Sarbecovirus currently known to infect humans.  If clinically indicated additional testing with an alternate test  methodology [phone]) is advised. The SARS-CoV-2 RNA is generally  detectable in upper and lower respiratory sp ecimens during the acute  phase of infection. The expected result is Negative. Fact Sheet for Patients:  StrictlyIdeas.no Fact Sheet for Healthcare Providers: BankingDealers.co.za This test is not yet approved or cleared by the [place] FDA and has been authorized for detection and/or diagnosis of SARS-CoV-2  by FDA under an Emergency Use Authorization (EUA).  This EUA will remain in effect (meaning this test can be used) for the duration of the COVID-19 declaration under Section 564(b)(1) of the Act, 21 U.S.C. section 360bbb-3(b)(1), unless the authorization is terminated or revoked sooner. Performed at [facility], [place] [address] [place], [place] [address]   Respiratory Panel by PCR     Status: None   Collection Time: 04/22/19 12:37 AM   Specimen: Nasopharyngeal Swab; Respiratory  Result Value Ref Range Status   Adenovirus NOT DETECTED NOT DETECTED Final   Coronavirus 229E NOT DETECTED NOT DETECTED Final    Comment: (NOTE) The Coronavirus on the Respiratory Panel, DOES NOT test for the novel  Coronavirus (2019 nCoV)    Coronavirus HKU1 NOT DETECTED NOT DETECTED Final   Coronavirus NL63 NOT DETECTED NOT DETECTED Final   Coronavirus OC43 NOT DETECTED NOT DETECTED Final    Metapneumovirus NOT DETECTED NOT DETECTED Final   Rhinovirus / Enterovirus NOT DETECTED NOT DETECTED Final   Influenza A NOT DETECTED NOT DETECTED Final   Influenza B NOT DETECTED NOT DETECTED Final   Parainfluenza Virus 1 NOT DETECTED NOT DETECTED Final   Parainfluenza Virus 2 NOT DETECTED NOT DETECTED Final   Parainfluenza Virus 3 NOT DETECTED NOT DETECTED Final   Parainfluenza Virus 4 NOT DETECTED NOT DETECTED Final   Respiratory Syncytial Virus NOT DETECTED NOT DETECTED Final   Bordetella pertussis NOT DETECTED NOT DETECTED Final   Chlamydophila pneumoniae NOT DETECTED NOT DETECTED Final   Mycoplasma pneumoniae NOT DETECTED NOT DETECTED Final    Comment: Performed at [facility], [place]. [address] [place], [place] [address]  Culture, blood (Routine X 2) w Reflex to ID Panel     Status: None (Preliminary result)   Collection Time: 04/22/19  1:08 AM   Specimen: BLOOD  Result Value Ref Range Status   Specimen Description BLOOD LEFT ANTECUBITAL  Final   Special Requests   Final    BOTTLES DRAWN AEROBIC AND ANAEROBIC Blood Culture results may not be optimal due to an inadequate volume of blood received in culture bottles   Culture   Final    NO GROWTH 4 DAYS Performed at [facility], [place] [address] [place], [place] [address]    Report Status PENDING  Incomplete  Culture, blood (Routine X 2) w Reflex to ID Panel     Status: None (Preliminary result)   Collection Time: 04/22/19  1:15 AM   Specimen: BLOOD RIGHT HAND  Result Value Ref Range Status   Specimen Description BLOOD RIGHT HAND  Final   Special Requests   Final    BOTTLES DRAWN AEROBIC AND ANAEROBIC Blood Culture adequate volume   Culture   Final    NO GROWTH 4 DAYS Performed at [facility], [place] [address] [place], [place] [address]    Report Status PENDING  Incomplete  Expectorated sputum assessment w rflx to resp cult     Status: None   Collection Time: 04/22/19  1:01 PM   Specimen: Sputum  Result  Value Ref Range Status   Specimen Description SPUTUM  Final   Special Requests NONE  Final   Sputum evaluation   Final    THIS SPECIMEN IS ACCEPTABLE FOR SPUTUM CULTURE Performed at [facility], [place] [address] [place], [place] [address]    Report Status 04/23/2019 FINAL  Final  Culture, respiratory     Status: None   Collection  Time: 04/22/19  1:01 PM   Specimen: SPU  Result Value Ref Range Status   Specimen Description SPUTUM  Final   Special Requests NONE Reflexed from [id number]  Final   Gram Stain   Final    RARE WBC PRESENT, PREDOMINANTLY PMN FEW GRAM POSITIVE COCCI IN PAIRS RARE YEAST RARE GRAM NEGATIVE RODS    Culture   Final    RARE Consistent with normal respiratory flora. Performed at [facility], [place] [address] [place], [place] [address]    Report Status 04/25/2019 FINAL  Final      Radiology Studies: Ct Chest High Resolution  Result Date: 04/25/2019 CLINICAL DATA:  Inpatient. Acute sinusitis. Clinical concern for interstitial lung disease. History of prostate cancer. EXAM: CT CHEST WITHOUT CONTRAST TECHNIQUE: Multidetector CT imaging of the chest was performed following the standard protocol without intravenous contrast. High resolution imaging of the lungs, as well as inspiratory and expiratory imaging, was performed. COMPARISON:  Chest radiograph from earlier today. FINDINGS: Cardiovascular: Normal heart size. No significant pericardial effusion/thickening. Left anterior descending and right coronary atherosclerosis. Atherosclerotic nonaneurysmal thoracic aorta. Normal caliber pulmonary arteries. Mediastinum/Nodes: No discrete thyroid nodules. Unremarkable esophagus. No axillary adenopathy. Mildly enlarged 1.1 cm right lower paratracheal node (series 6/image 67). Enlarged 1.3 cm right hilar node (series 6/image 76). No discrete left hilar adenopathy on this noncontrast scan. Lungs/Pleura: No pneumothorax. No pleural effusion. Moderate centrilobular and  paraseptal emphysema. No central airway stenoses. There is extensive patchy consolidation in the posterior greater than anterior right upper lobe and posterior right middle lobe with scattered air bronchograms, with extensive patchy ground-glass opacity throughout the remaining right lung. No discrete lung masses. Left lower lobe solid 6 mm average diameter pulmonary nodule (series 7/image 130). No additional significant pulmonary nodules. No significant regions of subpleural reticulation, traction bronchiectasis or frank honeycombing. Parenchymal band in right middle lobe is compatible with postinfectious/postinflammatory scarring. Upper abdomen: No significant regions of lobular air trapping on the expiration sequence. No evidence of tracheobronchomalacia. Musculoskeletal: No aggressive appearing focal osseous lesions. Sclerotic inferior L1 vertebral endplate lesion is unchanged since 08/20/2012 CT, probably degenerative. Moderate thoracic spondylosis. IMPRESSION: 1. Extensive patchy consolidation in the right upper and middle lobes with scattered air bronchograms. Extensive patchy ground-glass opacity throughout the remaining right lung. Findings are most compatible with a multilobar pneumonia. Follow-up post treatment chest CT advised in 3 months to document resolution of these findings. 2. No compelling findings to suggest underlying interstitial lung disease at this time. 3. Left lower lobe 6 mm pulmonary nodule. Non-contrast chest CT at 6-12 months is recommended. If the nodule is stable at time of repeat CT, then future CT at 18-24 months (from today's scan) is considered optional for low-risk patients, but is recommended for high-risk patients. This recommendation follows the consensus statement: Guidelines for Management of Incidental Pulmonary Nodules Detected on CT Images:From the Fleischner Society 2017; published online before print (10.1148/radiol.[id number]). 4. Mild mediastinal and right hilar  lymphadenopathy, nonspecific, potentially reactive, which can also be reassessed on the follow-up chest CT in 3 months. 5. Two-vessel coronary atherosclerosis. Aortic Atherosclerosis (ICD10-I70.0) and Emphysema (ICD10-J43.9). Electronically Signed   By: Ilona Sorrel M.D.   On: 04/25/2019 17:08   Dg Chest Port 1 View  Result Date: 04/25/2019 CLINICAL DATA:  Hypoxia, pneumonia. EXAM: PORTABLE CHEST 1 VIEW COMPARISON:  Radiographs of April 21, 2019. FINDINGS: The heart size and mediastinal contours are within normal limits. No pneumothorax or pleural effusion is noted. Left lung is clear. Stable  multifocal right lung opacities are noted, most prominently seen in the right upper lobe. The visualized skeletal structures are unremarkable. IMPRESSION: Stable multifocal right lung opacities are noted most consistent with pneumonia. Electronically Signed   By: Marijo Conception M.D.   On: 04/25/2019 13:20   Senath Cm  Result Date: 04/25/2019 CLINICAL DATA:  Acute sinusitis EXAM: CT PARANASAL SINUS LIMITED WITHOUT CONTRAST TECHNIQUE: Multidetector CT images of the paranasal sinuses were obtained using the standard protocol without intravenous contrast. COMPARISON:  None. FINDINGS: Paranasal sinuses: Frontal: Normally aerated. Patent frontal sinus drainage pathways. Ethmoid: Few small opacified ethmoid air cells. No widespread or advanced disease. Maxillary: Normally aerated. Sphenoid: Normally aerated. Patent sphenoethmoidal recesses. Right ostiomeatal unit: Patent. Left ostiomeatal unit: Patent. Nasal passages: Patent. Intact nasal septum is midline. Anatomy: No pneumatization superior to anterior ethmoid notches. Symmetric and intact olfactory grooves and fovea ethmoidalis, Keros II (4-8mm) Sellar sphenoid pneumatization pattern. No dehiscence of carotid or optic canals. No onodi cell. Other: There appears to be a pituitary mass with suprasellar extension measuring up to 2.5 cm in size. MRI brain with  and without contrast with attention to the pituitary gland recommended. IMPRESSION: No significant inflammatory sinus disease. Few opacified ethmoid air cells. Pituitary mass with suprasellar extension measuring up to 2.5 cm. Brain MRI with and without contrast recommended with attention to the pituitary gland. Electronically Signed   By: Nelson Chimes M.D.   On: 04/25/2019 16:59      Scheduled Meds:  aerochamber Z-Stat Plus/medium  1 each Other Once   amLODipine  5 mg Oral Daily   And   benazepril  10 mg Oral Daily   aspirin  81 mg Oral Daily   atorvastatin  10 mg Oral QHS   enoxaparin (LOVENOX) injection  40 mg Subcutaneous Q24H   Ipratropium-Albuterol  1 puff Inhalation BID   levothyroxine  112 mcg Oral Q0600   methadone  10 mg Oral TID   predniSONE  40 mg Oral Q breakfast   Followed by   Derrill Memo ON 04/30/2019] predniSONE  20 mg Oral Q breakfast   Followed by   Derrill Memo ON 05/07/2019] predniSONE  10 mg Oral Q breakfast   Continuous Infusions:   LOS: 5 days      Time spent: 35 minutes   Dessa Phi, DO [facility] [email or web address] 04/26/2019, 1:06 PM

## 2019-04-26 NOTE — Consult Note (Signed)
   [facility] CM Inpatient Consult   04/26/2019  Roger Durham Feb 06, 1956 [id number]   Brief review for follow up in the [place] Management for [facility] plan follow up for care and disease management for complex disease management.  Patient's primary care provider office is listed as Dr. Lavone Orn, [facility], to provide the transition of care follow up.   Patient will be followed by a [place] Coordinator.  If patient has post hospital needs, please contact:  Natividad Brood, RN BSN [facility] Liaison  [phone] business mobile phone Toll free office [phone]  Fax number: [phone] [email or web address]

## 2019-04-26 NOTE — Progress Notes (Signed)
Roger Durham, MRN:  [id number], DOB:  Aug 21, 1956, LOS: 5 ADMISSION DATE:  04/21/2019, CONSULTATION DATE:  8/10 REFERRING MD:  Dr. Benny Lennert [facility], CHIEF COMPLAINT:  SOB   History of present illness   63 year old male with PMH as below, which is significant for HTN, Hypothyroid, neuromuscular disorder, and chronic back pain on methadone. He is a retired Immunologist. He was in his usual state of health until the beginning of August when he worked with some brush in his back yard and inhaled what he belived to be a white power from within the brush. Since that time he has had progressive sinus pressure, dyspnea, and pleuritic chest pain with associated fevers. He also had cough productive for yellow tinged sputum. He presented with these complaints to his PCP on 8/3 and was prescribed a cephalosporin and again 8/7 after feeling worse and was given doxycycline. Then 8/9 with worsening symptoms he presented to the ED. In ED he was hypoxemic on room air requiring supplemental O2. CXR demonstrated multifocal airspace consolidation most notable in the RUL. He was admitted to the hospitalist service and treated for CAP with IV antibiotics. He requested pulmonary consultation 8/10.  Improved overnight  Past Medical History   has a past medical history of Cancer ([place]), Chronic back pain, Hematuria, Hypertension, Hypothyroidism, Neuromuscular disorder ([place]), Prostate cancer ([place]), and Seasonal allergies.  Significant Hospital Events   8/9 admit for presumed CAP 8/10 started on prednisone. abx stopped. Room air sats mid 90s 8/11->8/13. Feeling about same but daily room air resting pulse ox worsening. CXR w/ on-going right sided interstitial and intraalveolar airspace disease. PCCm asked to re-eval on 8/13. CT chest and sinus ordered. HSP and fungal AB panels sent.  Consults:  PCCM 8/10  Procedures:    Significant Diagnostic Tests:  CT chest 8/13 IMPRESSION: 1. Extensive patchy consolidation in the right upper  and middle lobes with scattered air bronchograms. Extensive patchy ground-glass opacity throughout the remaining right lung. Findings are most compatible with a multilobar pneumonia. Follow-up post treatment chest CT advised in 3 months to document resolution of these findings. 2. No compelling findings to suggest underlying interstitial lung disease at this time. 3. Left lower lobe 6 mm pulmonary nodule. Non-contrast chest CT at 6-12 months is recommended. If the nodule is stable at time of repeat CT, then future CT at 18-24 months (from today's scan) is considered optional for low-risk patients, but is recommended for high-risk patients. This recommendation follows the consensus statement: Guidelines for Management of Incidental Pulmonary Nodules Detected on CT Images:From the Fleischner Society 2017; published online before print (10.1148/radiol.8338250539). 4. Mild mediastinal and right hilar lymphadenopathy, nonspecific, potentially reactive, which can also be reassessed on the follow-up chest CT in 3 months. 5. Two-vessel coronary atherosclerosis.  CT sinus 8/13 IMPRESSION: No significant inflammatory sinus disease. Few opacified ethmoid air cells.  Pituitary mass with suprasellar extension measuring up to 2.5 cm. Brain MRI with and without contrast recommended with attention to the pituitary gland.  HSP panel 8/13>>> pending  Micro Data:  Blood 8/9 >neg  Sputum 8/10 > NOF RVP 8/9 > neg Fungal antibody test 8/13>>>  Antimicrobials:  Ceftriaxone 8/10 >off Azithromycin 8/10 >off  Interim history/subjective:  Improved overnight Off oxygen at present with saturations about 92 to 94%  Objective   Blood pressure 125/69, pulse 89, temperature 98.8 F (37.1 C), temperature source Oral, resp. rate 18, height 5\' 9"  (1.753 m), weight 98.4 kg, SpO2 (!) 89 %.  No intake or output data in the 24 hours ending 04/26/19 0175 Filed Weights   04/22/19 0036  Weight: 98.4  kg    Examination:  Pleasant, denies any significant complaints, does not feel short of breath HENT NCAT no JVD MMM Good air entry bilaterally, clear S1-S2 appreciated Abdomen is soft, nontender No edema Neuro intact   Resolved Hospital Problem list     Assessment & Plan:   Acute hypoxemic respiratory failure Interstitial process in the right lung High resolution CT scan of the chest reviewed -Not consistent with interstitial lung disease however there may be an underlying chronic process with a recent postinfectious complication -Clinically better -oxygen requirement improved - Follow-up on fungal antibody panel Follow-up on hypersensitivity panel  A follow-up CT scan of her chest in about 6 weeks will be appropriate Pulmonary function study as outpatient  If improvement in symptoms is not maintained, bronchoscopy can be considered Wean down prednisone Continue to hold off on antibiotics as is not showing any symptoms suggesting an acute infectious process at present  He had had a history of possible chronic obstructive pulmonary disease based off of a pulmonary function study a couple years ago Has not had any recent chest x-rays to compare current process with There is likely an underlying chronic process-chronic hypersensitivity pneumonitis will be a consideration  Sinusitis -Has had surgery in the past -CT is concerning for a growth -MRI recommended   COPD history -Limited smoking history -PFT as outpatient   Of most concern at present will be the finding on the CT scan of her sinuses-needs further evaluation  The plan will be to wean off steroids  Sherrilyn Rist, MD [facility], PCCM Cell: [phone]

## 2019-04-27 ENCOUNTER — Inpatient Hospital Stay (HOSPITAL_COMMUNITY): Payer: No Typology Code available for payment source

## 2019-04-27 LAB — CULTURE, BLOOD (ROUTINE X 2)
Culture: NO GROWTH
Culture: NO GROWTH
Special Requests: ADEQUATE

## 2019-04-27 MED ORDER — PREDNISONE 20 MG PO TABS: 20.0000 mg | ORAL_TABLET | Freq: Every day | ORAL | 0 refills | Status: AC

## 2019-04-27 MED ORDER — GADOBUTROL 1 MMOL/ML IV SOLN
10.0000 mL | Freq: Once | INTRAVENOUS | Status: AC | PRN
  Administered 2019-04-27: 10 mL via INTRAVENOUS

## 2019-04-27 MED ORDER — PREDNISONE 10 MG PO TABS: 10.0000 mg | ORAL_TABLET | Freq: Every day | ORAL | 0 refills | Status: DC

## 2019-04-27 MED ORDER — PREDNISONE 20 MG PO TABS: 40.0000 mg | ORAL_TABLET | Freq: Every day | ORAL | 0 refills | Status: AC

## 2019-04-27 NOTE — Plan of Care (Signed)
Patient discharged home in care of his wife. He verbalized understanding of follow-ups needed with internal med and neurosurgery. No further questions or concerns at this time.

## 2019-04-27 NOTE — Discharge Summary (Signed)
Physician Discharge Summary  Roger Durham NUU:[id number] DOB: October 08, 1955 DOA: 04/21/2019  PCP: Roger Orn, MD  Admit date: 04/21/2019 Discharge date: 04/27/2019  Admitted From: Home Disposition:  Home  Recommendations for Outpatient Follow-up:  1. Follow up with PCP in 1 week 2. Follow up with Dr. Ronnald Ramp, Neurosurgery for incidental pituitary adenoma  3. Recommend outpatient referral to endocrinology for incidental pituitary adenoma work up  4. Follow up with Pulmonology for follow up outpatient CT chest, PFT   Discharge Condition: Stable CODE STATUS: Full code Diet recommendation: Heart healthy diet  Brief/Interim Summary: Roger Durham is a 63 y.o.malewith medical history significant ofhypertension, hyperlipidemia, prostate cancer, hypothyroidism, chronic back pain,chronic pain syndrome onmethadone,who presents with shortness of breath, cough, sinus pain.  Patient states that his symptoms have been going on for about 9 days, including sinus pain and pressure, productive cough, shortness of breathand mildpleuritic right-sided chest pain. His symptoms has been progressively worsening.Patient was seen by PCP on 04/15/2019, and they diagnosed him as pneumonia. Patient was started on "a cephalosporin"for 5 days. After completed course of antibiotics, he did not feel better.He saw PCP again on 04/19/2019, and had a chest x-ray, which showed multifocal pneumonia. Patient was switched to doxycycline. He has been taking doxycycline in the past 2 days, but no improvement. He also hadCOVID swab by his PCP which was negative. Of note,patient states that when he was gardening roughly 2 days prior to start of his symptoms, he was exposed to a white powdery substance from a bucket of leaves he was cleaning out. He is unsure if these are related to his symptoms.  Checks x-ray showed persistent multifocal areas of airspace consolidation throughout the right lung, worse in the right upper  lobe.Patient is admitted to telemetry bed as inpatient. He was been started on Flagyl, Ceftriaxone and Azithromycin. Pulmonology has been consulted. They feel that the patient has pneumonitis and they have discontinued IV antibiotics and started the patient on steroids. Due to recurrent desaturations, pulmonology was re-consulted and patient underwent CT chest and CT maxillofacial.   CT maxillofacial revealed incidental pituitary adenoma.  Patient also underwent MRI brain.  Discharge Diagnoses:  Principal Problem:   Multifocal pneumonia Active Problems:   Hypothyroidism   HTN (hypertension)   HLD (hyperlipidemia)   Acute respiratory failure with hypoxia (HCC)   Chronic pain syndrome   Acute hypoxemic respiratory failure -Remains off of oxygen this morning  Multifocal pneumonia/pneumonitis -Patient initially treated with IV Rocephin, azithromycin as well as Flagyl.  He was evaluated for aspiration by SLP, demonstrated no concerns for aspiration.  Influenza PCR, respiratory panel PCR, COVID-19, HIV, strep pneumo urinary antigen, Legionella antigen were all negative.  Respiratory culture with normal respiratory flora.  Antibiotics were stopped.  Patient started on steroids. -Appreciate PCCM consultation -CT chest revealed extensive patchy consolidation right upper and middle lobes, extensive patchy groundglass opacity right lung.  Findings most compatible with multilobar pneumonia.  No compelling evidence for ILD.  Left lower lobe 6 mm pulmonary nodule.  Mild mediastinal and right hilar lymphadenopathy. -PCCM recommended patient remain off of antibiotics, start steroid taper.  They will plan to follow-up with a repeat CT as an outpatient in approximately 6 weeks as well as PFT.  They will follow-up with on fungal antibody panel, hypersensitivity panel.  May consider bronchoscopy pending clinical course as an outpatient  Incidental finding of pituitary mass -Brain MRI revealed 22 x 20 x 18 mm  sellar and suprasellar mass consistent with macro adenoma. T1  hyperintensity in the lower mass from proteinaceous cyst or hemorrhage, please correlate for apoplexy symptoms. There is upward mass effect on the chiasm. -Discussed with Dr. Ronnald Ramp, neurosurgery.  Recommend outpatient follow-up in his office. -Recommend patient refer to outpatient endocrinology as well for follow-up and work-up  Hypothyroidism -Diagnosed several years ago, currently on Synthroid  Essential hypertension -Continue amlodipine, benazepril  Hyperlipidemia -Continue Lipitor  Chronic pain syndrome, chronic back pain -Continue methadone  Discharge Instructions  Discharge Instructions    Call MD for:   Complete by: As directed    Visual changes   Call MD for:  difficulty breathing, headache or visual disturbances   Complete by: As directed    Call MD for:  extreme fatigue   Complete by: As directed    Call MD for:  hives   Complete by: As directed    Call MD for:  persistant dizziness or light-headedness   Complete by: As directed    Call MD for:  persistant nausea and vomiting   Complete by: As directed    Call MD for:  severe uncontrolled pain   Complete by: As directed    Call MD for:  temperature >100.4   Complete by: As directed    Diet - low sodium heart healthy   Complete by: As directed    Discharge instructions   Complete by: As directed    You were cared for by a hospitalist during your hospital stay. If you have any questions about your discharge medications or the care you received while you were in the hospital after you are discharged, you can call the unit and ask to speak with the hospitalist on call if the hospitalist that took care of you is not available. Once you are discharged, your primary care physician will handle any further medical issues. Please note that NO REFILLS for any discharge medications will be authorized once you are discharged, as it is imperative that you return to  your primary care physician (or establish a relationship with a primary care physician if you do not have one) for your aftercare needs so that they can reassess your need for medications and monitor your lab values.   Increase activity slowly   Complete by: As directed      Allergies as of 04/27/2019      Reactions   Penicillins Other (See Comments)   Tingling in mouth. Intolerance.      Medication List    TAKE these medications   acetaminophen 500 MG tablet Commonly known as: TYLENOL Take 1,000 mg by mouth every 6 (six) hours as needed for pain.   amLODipine-benazepril 5-10 MG capsule Commonly known as: LOTREL Take 1 capsule by mouth every morning.   aspirin 81 MG tablet Take 1 tablet (81 mg total) by mouth daily.   atorvastatin 10 MG tablet Commonly known as: LIPITOR Take 10 mg by mouth at bedtime.   diphenhydrAMINE 25 mg capsule Commonly known as: BENADRYL Take 25 mg by mouth as needed for allergies.   EPINEPHrine 0.3 mg/0.3 mL Soaj injection Commonly known as: EPI-PEN Inject 0.3 mg into the muscle as needed for anaphylaxis or allergies.   levothyroxine 112 MCG tablet Commonly known as: SYNTHROID Take 112 mcg by mouth daily before breakfast.   methadone 10 MG tablet Commonly known as: DOLOPHINE Take 10 mg by mouth 3 (three) times daily. For pain by pain management center   predniSONE 20 MG tablet Commonly known as: DELTASONE Take 2 tablets (40 mg total)  by mouth daily with breakfast for 1 day. Start taking on: April 28, 2019   predniSONE 20 MG tablet Commonly known as: DELTASONE Take 1 tablet (20 mg total) by mouth daily with breakfast for 7 days. Start taking on: April 30, 2019   predniSONE 10 MG tablet Commonly known as: DELTASONE Take 1 tablet (10 mg total) by mouth daily with breakfast. Start taking on: May 07, 2019   Qvar 80 MCG/ACT inhaler Generic drug: beclomethasone Inhale 1 puff into the lungs as needed.      Follow-up Information     Roger Orn, MD. Schedule an appointment as soon as possible for a visit in 1 week(s).   Specialty: Internal Medicine Why: Recommend outpatient endocrinology referral Contact information: [address] [place] [address] [phone]        Eustace Moore, MD Follow up.   Specialty: Neurosurgery Contact information: [address] [place] [address] [phone]        Laurin Coder, MD. Schedule an appointment as soon as possible for a visit.   Specialty: Pulmonary Disease Contact information: [place] [address] [phone]          Allergies  Allergen Reactions  . Penicillins Other (See Comments)    Tingling in mouth. Intolerance.    Consultations:  PCCM    Procedures/Studies: Dg Chest 2 View  Result Date: 04/21/2019 CLINICAL DATA:  Sinus infection and pneumonia. Shortness of breath. EXAM: CHEST - 2 VIEW COMPARISON:  April 19, 2019 FINDINGS: Cardiomediastinal silhouette is normal. Mediastinal contours appear intact. Persistent multifocal areas of airspace consolidation throughout the right lung, worse in the right upper lobe. Osseous structures are without acute abnormality. Soft tissues are grossly normal. IMPRESSION: Persistent multifocal areas of airspace consolidation throughout the right lung, worse in the right upper lobe. Electronically Signed   By: Fidela Salisbury M.D.   On: 04/21/2019 20:03   Dg Chest 2 View  Result Date: 04/19/2019 CLINICAL DATA:  Cough and shortness of breath.  Fever. EXAM: CHEST - 2 VIEW COMPARISON:  November 15, 2012 FINDINGS: There is infiltrate centered in the right upper lobe with probable mild involvement in the right middle lobe. The left lung is clear. The cardiomediastinal silhouette is unremarkable. No pneumothorax. No other changes. IMPRESSION: Right upper lobe, and probably right middle lobe, infiltrate consistent with pneumonia. Recommend short-term  follow-up to ensure resolution. Electronically Signed   By: Dorise Bullion III M.D   On: 04/19/2019 16:35   Mr Brain W Wo Contrast  Result Date: 04/27/2019 CLINICAL DATA:  Incidental pituitary mass EXAM: MRI HEAD WITHOUT AND WITH CONTRAST TECHNIQUE: Multiplanar, multiecho pulse sequences of the brain and surrounding structures were obtained without and with intravenous contrast. CONTRAST:  10 cc Gadavist intravenous COMPARISON:  Sinus CT from 2 days ago FINDINGS: Brain: Snowman shape sellar and suprasellar mass with hypoenhancement compared to normal pituitary. Maximal dimensions are 22 x 20 x 18 mm. Intrinsic T1 hyperintense patchy signal at the level of the sellar component. No cavernous sinus extension. There is broad contact and upper displacement of the optic chiasm. Mild cerebral white matter disease attributed to microvascular ischemia. No acute infarct, hydrocephalus, collection, or atrophy. Vascular: Major flow voids are preserved Skull and upper cervical spine: Negative for marrow lesion Sinuses/Orbits: Bilateral cataract resection. No significant sinusitis. IMPRESSION: 22 x 20 x 18 mm sellar and suprasellar mass consistent with macro adenoma. T1 hyperintensity in the lower mass from  proteinaceous cyst or hemorrhage, please correlate for apoplexy symptoms. There is upward mass effect on the chiasm. Electronically Signed   By: Monte Fantasia M.D.   On: 04/27/2019 14:43   Ct Chest High Resolution  Result Date: 04/25/2019 CLINICAL DATA:  Inpatient. Acute sinusitis. Clinical concern for interstitial lung disease. History of prostate cancer. EXAM: CT CHEST WITHOUT CONTRAST TECHNIQUE: Multidetector CT imaging of the chest was performed following the standard protocol without intravenous contrast. High resolution imaging of the lungs, as well as inspiratory and expiratory imaging, was performed. COMPARISON:  Chest radiograph from earlier today. FINDINGS: Cardiovascular: Normal heart size. No significant  pericardial effusion/thickening. Left anterior descending and right coronary atherosclerosis. Atherosclerotic nonaneurysmal thoracic aorta. Normal caliber pulmonary arteries. Mediastinum/Nodes: No discrete thyroid nodules. Unremarkable esophagus. No axillary adenopathy. Mildly enlarged 1.1 cm right lower paratracheal node (series 6/image 67). Enlarged 1.3 cm right hilar node (series 6/image 76). No discrete left hilar adenopathy on this noncontrast scan. Lungs/Pleura: No pneumothorax. No pleural effusion. Moderate centrilobular and paraseptal emphysema. No central airway stenoses. There is extensive patchy consolidation in the posterior greater than anterior right upper lobe and posterior right middle lobe with scattered air bronchograms, with extensive patchy ground-glass opacity throughout the remaining right lung. No discrete lung masses. Left lower lobe solid 6 mm average diameter pulmonary nodule (series 7/image 130). No additional significant pulmonary nodules. No significant regions of subpleural reticulation, traction bronchiectasis or frank honeycombing. Parenchymal band in right middle lobe is compatible with postinfectious/postinflammatory scarring. Upper abdomen: No significant regions of lobular air trapping on the expiration sequence. No evidence of tracheobronchomalacia. Musculoskeletal: No aggressive appearing focal osseous lesions. Sclerotic inferior L1 vertebral endplate lesion is unchanged since 08/20/2012 CT, probably degenerative. Moderate thoracic spondylosis. IMPRESSION: 1. Extensive patchy consolidation in the right upper and middle lobes with scattered air bronchograms. Extensive patchy ground-glass opacity throughout the remaining right lung. Findings are most compatible with a multilobar pneumonia. Follow-up post treatment chest CT advised in 3 months to document resolution of these findings. 2. No compelling findings to suggest underlying interstitial lung disease at this time. 3. Left  lower lobe 6 mm pulmonary nodule. Non-contrast chest CT at 6-12 months is recommended. If the nodule is stable at time of repeat CT, then future CT at 18-24 months (from today's scan) is considered optional for low-risk patients, but is recommended for high-risk patients. This recommendation follows the consensus statement: Guidelines for Management of Incidental Pulmonary Nodules Detected on CT Images:From the Fleischner Society 2017; published online before print (10.1148/radiol.[id number]). 4. Mild mediastinal and right hilar lymphadenopathy, nonspecific, potentially reactive, which can also be reassessed on the follow-up chest CT in 3 months. 5. Two-vessel coronary atherosclerosis. Aortic Atherosclerosis (ICD10-I70.0) and Emphysema (ICD10-J43.9). Electronically Signed   By: Ilona Sorrel M.D.   On: 04/25/2019 17:08   Dg Chest Port 1 View  Result Date: 04/25/2019 CLINICAL DATA:  Hypoxia, pneumonia. EXAM: PORTABLE CHEST 1 VIEW COMPARISON:  Radiographs of April 21, 2019. FINDINGS: The heart size and mediastinal contours are within normal limits. No pneumothorax or pleural effusion is noted. Left lung is clear. Stable multifocal right lung opacities are noted, most prominently seen in the right upper lobe. The visualized skeletal structures are unremarkable. IMPRESSION: Stable multifocal right lung opacities are noted most consistent with pneumonia. Electronically Signed   By: Marijo Conception M.D.   On: 04/25/2019 13:20   [place] Cm  Result Date: 04/25/2019 CLINICAL DATA:  Acute sinusitis EXAM: CT PARANASAL SINUS LIMITED WITHOUT CONTRAST TECHNIQUE:  Multidetector CT images of the paranasal sinuses were obtained using the standard protocol without intravenous contrast. COMPARISON:  None. FINDINGS: Paranasal sinuses: Frontal: Normally aerated. Patent frontal sinus drainage pathways. Ethmoid: Few small opacified ethmoid air cells. No widespread or advanced disease. Maxillary: Normally aerated.  Sphenoid: Normally aerated. Patent sphenoethmoidal recesses. Right ostiomeatal unit: Patent. Left ostiomeatal unit: Patent. Nasal passages: Patent. Intact nasal septum is midline. Anatomy: No pneumatization superior to anterior ethmoid notches. Symmetric and intact olfactory grooves and fovea ethmoidalis, Keros II (4-70mm) Sellar sphenoid pneumatization pattern. No dehiscence of carotid or optic canals. No onodi cell. Other: There appears to be a pituitary mass with suprasellar extension measuring up to 2.5 cm in size. MRI brain with and without contrast with attention to the pituitary gland recommended. IMPRESSION: No significant inflammatory sinus disease. Few opacified ethmoid air cells. Pituitary mass with suprasellar extension measuring up to 2.5 cm. Brain MRI with and without contrast recommended with attention to the pituitary gland. Electronically Signed   By: Nelson Chimes M.D.   On: 04/25/2019 16:59      Discharge Exam: Vitals:   04/26/19 2340 04/27/19 0733  BP: 128/76 127/77  Pulse: 66 (!) 58  Resp: 16 13  Temp: 98.5 F (36.9 C) 98.4 F (36.9 C)  SpO2: 96% 94%    General: Pt is alert, awake, not in acute distress Cardiovascular: RRR, S1/S2 +, no edema Respiratory: CTA bilaterally, no wheezing, no rhonchi, no respiratory distress, no conversational dyspnea  Abdominal: Soft, NT, ND, bowel sounds + Extremities: no edema, no cyanosis Psych: Normal mood and affect, stable judgement and insight     The results of significant diagnostics from this hospitalization (including imaging, microbiology, ancillary and laboratory) are listed below for reference.     Microbiology: Recent Results (from the past 240 hour(s))  SARS Coronavirus 2 [facility] order, Performed in [facility] hospital lab) Nasopharyngeal Nasopharyngeal Swab     Status: None   Collection Time: 04/21/19  8:58 PM   Specimen: Nasopharyngeal Swab  Result Value Ref Range Status   SARS Coronavirus 2 NEGATIVE NEGATIVE Final     Comment: (NOTE) If result is NEGATIVE SARS-CoV-2 target nucleic acids are NOT DETECTED. The SARS-CoV-2 RNA is generally detectable in upper and lower  respiratory specimens during the acute phase of infection. The lowest  concentration of SARS-CoV-2 viral copies this assay can detect is 250  copies / mL. A negative result does not preclude SARS-CoV-2 infection  and should not be used as the sole basis for treatment or other  patient management decisions.  A negative result may occur with  improper specimen collection / handling, submission of specimen other  than nasopharyngeal swab, presence of viral mutation(s) within the  areas targeted by this assay, and inadequate number of viral copies  (<250 copies / mL). A negative result must be combined with clinical  observations, patient history, and epidemiological information. If result is POSITIVE SARS-CoV-2 target nucleic acids are DETECTED. The SARS-CoV-2 RNA is generally detectable in upper and lower  respiratory specimens dur ing the acute phase of infection.  Positive  results are indicative of active infection with SARS-CoV-2.  Clinical  correlation with patient history and other diagnostic information is  necessary to determine patient infection status.  Positive results do  not rule out bacterial infection or co-infection with other viruses. If result is PRESUMPTIVE POSTIVE SARS-CoV-2 nucleic acids MAY BE PRESENT.   A presumptive positive result was obtained on the submitted specimen  and confirmed on repeat testing.  While 2019 novel coronavirus  (SARS-CoV-2) nucleic acids may be present in the submitted sample  additional confirmatory testing may be necessary for epidemiological  and / or clinical management purposes  to differentiate between  SARS-CoV-2 and other Sarbecovirus currently known to infect humans.  If clinically indicated additional testing with an alternate test  methodology [phone]) is advised. The  SARS-CoV-2 RNA is generally  detectable in upper and lower respiratory sp ecimens during the acute  phase of infection. The expected result is Negative. Fact Sheet for Patients:  StrictlyIdeas.no Fact Sheet for Healthcare Providers: BankingDealers.co.za This test is not yet approved or cleared by the [place] FDA and has been authorized for detection and/or diagnosis of SARS-CoV-2 by FDA under an Emergency Use Authorization (EUA).  This EUA will remain in effect (meaning this test can be used) for the duration of the COVID-19 declaration under Section 564(b)(1) of the Act, 21 U.S.C. section 360bbb-3(b)(1), unless the authorization is terminated or revoked sooner. Performed at [facility], [place] [address], [place], [place] [address]   Respiratory Panel by PCR     Status: None   Collection Time: 04/22/19 12:37 AM   Specimen: Nasopharyngeal Swab; Respiratory  Result Value Ref Range Status   Adenovirus NOT DETECTED NOT DETECTED Final   Coronavirus 229E NOT DETECTED NOT DETECTED Final    Comment: (NOTE) The Coronavirus on the Respiratory Panel, DOES NOT test for the novel  Coronavirus (2019 nCoV)    Coronavirus HKU1 NOT DETECTED NOT DETECTED Final   Coronavirus NL63 NOT DETECTED NOT DETECTED Final   Coronavirus OC43 NOT DETECTED NOT DETECTED Final   Metapneumovirus NOT DETECTED NOT DETECTED Final   Rhinovirus / Enterovirus NOT DETECTED NOT DETECTED Final   Influenza A NOT DETECTED NOT DETECTED Final   Influenza B NOT DETECTED NOT DETECTED Final   Parainfluenza Virus 1 NOT DETECTED NOT DETECTED Final   Parainfluenza Virus 2 NOT DETECTED NOT DETECTED Final   Parainfluenza Virus 3 NOT DETECTED NOT DETECTED Final   Parainfluenza Virus 4 NOT DETECTED NOT DETECTED Final   Respiratory Syncytial Virus NOT DETECTED NOT DETECTED Final   Bordetella pertussis NOT DETECTED NOT DETECTED Final   Chlamydophila pneumoniae NOT DETECTED NOT  DETECTED Final   Mycoplasma pneumoniae NOT DETECTED NOT DETECTED Final    Comment: Performed at [facility], [place]. [address] [place], [place] [address]  Culture, blood (Routine X 2) w Reflex to ID Panel     Status: None   Collection Time: 04/22/19  1:08 AM   Specimen: BLOOD  Result Value Ref Range Status   Specimen Description BLOOD LEFT ANTECUBITAL  Final   Special Requests   Final    BOTTLES DRAWN AEROBIC AND ANAEROBIC Blood Culture results may not be optimal due to an inadequate volume of blood received in culture bottles   Culture   Final    NO GROWTH 5 DAYS Performed at [facility], [place] [address], [place], [place] [address]    Report Status 04/27/2019 FINAL  Final  Culture, blood (Routine X 2) w Reflex to ID Panel     Status: None   Collection Time: 04/22/19  1:15 AM   Specimen: BLOOD RIGHT HAND  Result Value Ref Range Status   Specimen Description BLOOD RIGHT HAND  Final   Special Requests   Final    BOTTLES DRAWN AEROBIC AND ANAEROBIC Blood Culture adequate volume   Culture   Final    NO GROWTH 5 DAYS Performed  at [facility], [place] [address], [place], [place] [address]    Report Status 04/27/2019 FINAL  Final  Expectorated sputum assessment w rflx to resp cult     Status: None   Collection Time: 04/22/19  1:01 PM   Specimen: Sputum  Result Value Ref Range Status   Specimen Description SPUTUM  Final   Special Requests NONE  Final   Sputum evaluation   Final    THIS SPECIMEN IS ACCEPTABLE FOR SPUTUM CULTURE Performed at [facility], [place] [address], [place], [place] [address]    Report Status 04/23/2019 FINAL  Final  Culture, respiratory     Status: None   Collection Time: 04/22/19  1:01 PM   Specimen: SPU  Result Value Ref Range Status   Specimen Description SPUTUM  Final   Special Requests NONE Reflexed from [id number]  Final   Gram Stain   Final    RARE WBC PRESENT, PREDOMINANTLY PMN FEW GRAM POSITIVE COCCI IN PAIRS RARE YEAST RARE GRAM  NEGATIVE RODS    Culture   Final    RARE Consistent with normal respiratory flora. Performed at [facility], [place] [address] [place], [place] [address]    Report Status 04/25/2019 FINAL  Final     Labs: BNP (last 3 results) No results for input(s): BNP in the last 8760 hours. Basic Metabolic Panel: Recent Labs  Lab 04/21/19 1917 04/23/19 0422 04/26/19 0716  NA 135 137 142  K 4.2 4.2 4.5  CL 97* 98 104  CO2 26 27 29   GLUCOSE 118* 128* 97  BUN 16 15 21   CREATININE 0.70 0.73 0.77  CALCIUM 8.5* 8.5* 8.5*   Liver Function Tests: Recent Labs  Lab 04/21/19 1917  AST 26  ALT 31  ALKPHOS 83  BILITOT 0.5  PROT 6.8  ALBUMIN 2.8*   No results for input(s): LIPASE, AMYLASE in the last 168 hours. No results for input(s): AMMONIA in the last 168 hours. CBC: Recent Labs  Lab 04/21/19 1917 04/23/19 0422 04/26/19 0716  WBC 11.9* 12.1* 11.2*  NEUTROABS 8.3* 9.4* 7.2  HGB 12.1* 11.1* 11.0*  HCT 37.5* 34.0* 33.8*  MCV 86.8 85.4 85.8  PLT 372 430* 477*   Cardiac Enzymes: No results for input(s): CKTOTAL, CKMB, CKMBINDEX, TROPONINI in the last 168 hours. BNP: Invalid input(s): POCBNP CBG: No results for input(s): GLUCAP in the last 168 hours. D-Dimer No results for input(s): DDIMER in the last 72 hours. Hgb A1c No results for input(s): HGBA1C in the last 72 hours. Lipid Profile No results for input(s): CHOL, HDL, LDLCALC, TRIG, CHOLHDL, LDLDIRECT in the last 72 hours. Thyroid function studies No results for input(s): TSH, T4TOTAL, T3FREE, THYROIDAB in the last 72 hours.  Invalid input(s): FREET3 Anemia work up No results for input(s): VITAMINB12, FOLATE, FERRITIN, TIBC, IRON, RETICCTPCT in the last 72 hours. Urinalysis    Component Value Date/Time   LABSPEC 1.015 01/28/2016 1044   PHURINE 5.0 01/28/2016 1044   GLUCOSEU Negative 01/28/2016 1044   HGBUR Moderate 01/28/2016 1044   BILIRUBINUR Negative 01/28/2016 1044   KETONESUR Negative 01/28/2016 1044    PROTEINUR Negative 01/28/2016 1044   UROBILINOGEN 0.2 01/28/2016 1044   NITRITE Negative 01/28/2016 1044   LEUKOCYTESUR Negative 01/28/2016 1044   Sepsis Labs Invalid input(s): PROCALCITONIN,  WBC,  LACTICIDVEN Microbiology Recent Results (from the past 240 hour(s))  SARS Coronavirus 2 [facility] order, Performed in [facility] hospital lab) Nasopharyngeal Nasopharyngeal Swab     Status: None  Collection Time: 04/21/19  8:58 PM   Specimen: Nasopharyngeal Swab  Result Value Ref Range Status   SARS Coronavirus 2 NEGATIVE NEGATIVE Final    Comment: (NOTE) If result is NEGATIVE SARS-CoV-2 target nucleic acids are NOT DETECTED. The SARS-CoV-2 RNA is generally detectable in upper and lower  respiratory specimens during the acute phase of infection. The lowest  concentration of SARS-CoV-2 viral copies this assay can detect is 250  copies / mL. A negative result does not preclude SARS-CoV-2 infection  and should not be used as the sole basis for treatment or other  patient management decisions.  A negative result may occur with  improper specimen collection / handling, submission of specimen other  than nasopharyngeal swab, presence of viral mutation(s) within the  areas targeted by this assay, and inadequate number of viral copies  (<250 copies / mL). A negative result must be combined with clinical  observations, patient history, and epidemiological information. If result is POSITIVE SARS-CoV-2 target nucleic acids are DETECTED. The SARS-CoV-2 RNA is generally detectable in upper and lower  respiratory specimens dur ing the acute phase of infection.  Positive  results are indicative of active infection with SARS-CoV-2.  Clinical  correlation with patient history and other diagnostic information is  necessary to determine patient infection status.  Positive results do  not rule out bacterial infection or co-infection with other viruses. If result is PRESUMPTIVE POSTIVE SARS-CoV-2  nucleic acids MAY BE PRESENT.   A presumptive positive result was obtained on the submitted specimen  and confirmed on repeat testing.  While 2019 novel coronavirus  (SARS-CoV-2) nucleic acids may be present in the submitted sample  additional confirmatory testing may be necessary for epidemiological  and / or clinical management purposes  to differentiate between  SARS-CoV-2 and other Sarbecovirus currently known to infect humans.  If clinically indicated additional testing with an alternate test  methodology [phone]) is advised. The SARS-CoV-2 RNA is generally  detectable in upper and lower respiratory sp ecimens during the acute  phase of infection. The expected result is Negative. Fact Sheet for Patients:  StrictlyIdeas.no Fact Sheet for Healthcare Providers: BankingDealers.co.za This test is not yet approved or cleared by the [place] FDA and has been authorized for detection and/or diagnosis of SARS-CoV-2 by FDA under an Emergency Use Authorization (EUA).  This EUA will remain in effect (meaning this test can be used) for the duration of the COVID-19 declaration under Section 564(b)(1) of the Act, 21 U.S.C. section 360bbb-3(b)(1), unless the authorization is terminated or revoked sooner. Performed at [facility], [place] [address] [place], [place] [address]   Respiratory Panel by PCR     Status: None   Collection Time: 04/22/19 12:37 AM   Specimen: Nasopharyngeal Swab; Respiratory  Result Value Ref Range Status   Adenovirus NOT DETECTED NOT DETECTED Final   Coronavirus 229E NOT DETECTED NOT DETECTED Final    Comment: (NOTE) The Coronavirus on the Respiratory Panel, DOES NOT test for the novel  Coronavirus (2019 nCoV)    Coronavirus HKU1 NOT DETECTED NOT DETECTED Final   Coronavirus NL63 NOT DETECTED NOT DETECTED Final   Coronavirus OC43 NOT DETECTED NOT DETECTED Final   Metapneumovirus NOT DETECTED NOT DETECTED  Final   Rhinovirus / Enterovirus NOT DETECTED NOT DETECTED Final   Influenza A NOT DETECTED NOT DETECTED Final   Influenza B NOT DETECTED NOT DETECTED Final   Parainfluenza Virus 1 NOT DETECTED NOT DETECTED Final   Parainfluenza Virus 2 NOT DETECTED  NOT DETECTED Final   Parainfluenza Virus 3 NOT DETECTED NOT DETECTED Final   Parainfluenza Virus 4 NOT DETECTED NOT DETECTED Final   Respiratory Syncytial Virus NOT DETECTED NOT DETECTED Final   Bordetella pertussis NOT DETECTED NOT DETECTED Final   Chlamydophila pneumoniae NOT DETECTED NOT DETECTED Final   Mycoplasma pneumoniae NOT DETECTED NOT DETECTED Final    Comment: Performed at [facility], [place] [address], [place], [place] [address]  Culture, blood (Routine X 2) w Reflex to ID Panel     Status: None   Collection Time: 04/22/19  1:08 AM   Specimen: BLOOD  Result Value Ref Range Status   Specimen Description BLOOD LEFT ANTECUBITAL  Final   Special Requests   Final    BOTTLES DRAWN AEROBIC AND ANAEROBIC Blood Culture results may not be optimal due to an inadequate volume of blood received in culture bottles   Culture   Final    NO GROWTH 5 DAYS Performed at [facility], [place] [address] [place], [place] [address]    Report Status 04/27/2019 FINAL  Final  Culture, blood (Routine X 2) w Reflex to ID Panel     Status: None   Collection Time: 04/22/19  1:15 AM   Specimen: BLOOD RIGHT HAND  Result Value Ref Range Status   Specimen Description BLOOD RIGHT HAND  Final   Special Requests   Final    BOTTLES DRAWN AEROBIC AND ANAEROBIC Blood Culture adequate volume   Culture   Final    NO GROWTH 5 DAYS Performed at [facility], [place] [address], [place], [place] [address]    Report Status 04/27/2019 FINAL  Final  Expectorated sputum assessment w rflx to resp cult     Status: None   Collection Time: 04/22/19  1:01 PM   Specimen: Sputum  Result Value Ref Range Status   Specimen Description SPUTUM  Final   Special  Requests NONE  Final   Sputum evaluation   Final    THIS SPECIMEN IS ACCEPTABLE FOR SPUTUM CULTURE Performed at [facility], [place] [address], [place], [place] [address]    Report Status 04/23/2019 FINAL  Final  Culture, respiratory     Status: None   Collection Time: 04/22/19  1:01 PM   Specimen: SPU  Result Value Ref Range Status   Specimen Description SPUTUM  Final   Special Requests NONE Reflexed from [id number]  Final   Gram Stain   Final    RARE WBC PRESENT, PREDOMINANTLY PMN FEW GRAM POSITIVE COCCI IN PAIRS RARE YEAST RARE GRAM NEGATIVE RODS    Culture   Final    RARE Consistent with normal respiratory flora. Performed at [facility], [place] [address] [place], [place] [address]    Report Status 04/25/2019 FINAL  Final     Patient was seen and examined on the day of discharge and was found to be in stable condition. Time coordinating discharge: 45 minutes including assessment and coordination of care, as well as examination of the patient.   SIGNED:  Dessa Phi, DO [facility] [email or web address] 04/27/2019, 3:33 PM

## 2019-04-28 LAB — FUNGAL ANTIBODIES PANEL, ID-BLOOD
Aspergillus flavus: NEGATIVE
Aspergillus fumigatus, IgG: NEGATIVE
Aspergillus niger: NEGATIVE
Blastomyces Abs, Qn, DID: NEGATIVE
Histoplasma Ab, Immunodiffusion: NEGATIVE

## 2019-04-29 ENCOUNTER — Other Ambulatory Visit: Payer: Self-pay | Admitting: *Deleted

## 2019-04-29 LAB — HYPERSENSITIVITY PNEUMONITIS
A. Pullulans Abs: NEGATIVE
A.Fumigatus #1 Abs: NEGATIVE
Micropolyspora faeni, IgG: NEGATIVE
Pigeon Serum Abs: NEGATIVE
Thermoact. Saccharii: NEGATIVE
Thermoactinomyces vulgaris, IgG: NEGATIVE

## 2019-04-29 NOTE — Patient Outreach (Signed)
[facility]) Care Management  04/29/2019  DVAUGHN FICKLE [birth year] [id number]  Transition of care telephone call  Referral received: 04/24/19 Initial outreach: 04/29/19 Insurance: [place]  Initial unsuccessful telephone call to patient's preferred (mobile) number in order to complete transition of care assessment; no answer, left HIPAA compliant voicemail message requesting return call.   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  Comorbidities include: HTN, hypothyroidism, chronic back pain- on methadone, prostate cancer, hyperlipidemia and seasonal allergies  He was discharged to home on 04/27/19 without the need for home health services or durable medical equipment per the discharge summary.    Plan: If no return call from the patient by end of business day today, this RNCM will route unsuccessful outreach letter with [place] [employer] pamphlet and 24 hour Nurse Advice Line Magnet to [facility] clinical pool to be mailed to patient's home address. This RNCM will attempt another outreach within 4 business days.  Barrington Ellison RN,CCM,CDE [place] Management Coordinator Office Phone [phone] Office Fax [phone]

## 2019-04-29 NOTE — Telephone Encounter (Signed)
Checked to see if pt was still in hospital and pt has been discharged.  Pt was discharged 04/27/2019 after being admitted 8/9-8/15.  Pt was covid tested 8/9 when admitted and the results were negative.  Attempted to call pt to see if we could get him scheduled for a hospital follow up visit but unable to reach. Left message for pt to return call. When pt returns call, please schedule him a hospital follow up visit with pulmologist. Thanks!

## 2019-05-01 NOTE — Telephone Encounter (Signed)
Pt has been scheduled with Beth on 05/27/2019 at 1500. Nothing further was needed at this time.

## 2019-05-02 ENCOUNTER — Other Ambulatory Visit: Payer: Self-pay | Admitting: *Deleted

## 2019-05-02 ENCOUNTER — Ambulatory Visit: Payer: Self-pay | Admitting: *Deleted

## 2019-05-02 NOTE — Patient Outreach (Addendum)
[name]) Care Management  05/02/2019  [name] [birth year] [id number]   Transition of care telephone call  Referral received: 04/24/19 Initial outreach: 04/29/19 Insurance: [place]  Second unsuccessful telephone call to patient's preferred (mobile) number in order to complete transition of care assessment; no answer, left HIPAA compliant voicemail message requesting return call.   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  Comorbidities include: HTN, hypothyroidism, chronic back pain- on methadone, prostate cancer, hyperlipidemia and seasonal allergies  He was discharged to home on 04/27/19 without the need for home health services or durable medical equipment per the discharge summary.  He has a hospital follow up appointment scheduled with his pulmonology provider on 05/27/19.  Plan: If no return call from patient, this RNCM will attempt another outreach within 4 business days.  Barrington Ellison RN,CCM,CDE [place] Management Coordinator Office Phone [phone] Office Fax [phone]

## 2019-05-07 ENCOUNTER — Ambulatory Visit: Payer: Self-pay | Admitting: *Deleted

## 2019-05-07 ENCOUNTER — Other Ambulatory Visit: Payer: Self-pay | Admitting: *Deleted

## 2019-05-07 NOTE — Patient Outreach (Addendum)
[facility]) Care Management  05/07/2019  Roger Durham [birth year] [id number]  Transition of care telephone call  Referral received:04/24/19 Initial outreach:04/29/19 Insurance: [place] unsuccessful telephone call to patient's preferred(mobile)number in order to complete transition of care assessment; no answer, left HIPAA compliant voicemail message requesting return call.   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 Comorbidities include:HTN, hypothyroidism, chronic back pain- on methadone, prostate cancer, hyperlipidemia and seasonal allergies Hewas discharged to home on8/15/20 without the need for home health services or durable medical equipment per the discharge summary.  His primary care provider's office attempted transition of care outreach on 8/17 and 8/18 without success.  He has a hospital follow up appointment scheduled with his pulmonology provider on 05/27/19.  Plan: If no return call from patient, will close case to [facility] services in 10 business days after initial post hospital discharge outreach, on 05/10/19.  Barrington Ellison RN,CCM,CDE [place] Management Coordinator Office Phone [phone] Office Fax [phone]

## 2019-05-10 ENCOUNTER — Other Ambulatory Visit: Payer: Self-pay | Admitting: *Deleted

## 2019-05-10 NOTE — Patient Outreach (Addendum)
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 Comorbidities include:HTN, hypothyroidism, chronic back pain- on methadone, prostate cancer, hyperlipidemia and seasonal allergies Hewas discharged to home on8/15/20 without the need for home health services or durable medical equipment per the discharge summary. His primary care provider's office attempted transition of care outreach on 8/17 and 8/18 without success.  He has a hospital follow up appointment scheduled with his pulmonology provider on 05/27/19.  Assessment: Unable to complete post hospital discharge transition of care assessment; no return call from patient after 3 attempts and no response to request to contact this RNCM in unsuccessful outreach letter mailed to patient's home on 04/29/19.   Plan: Case closed to [facility] [place] [facility] services as it has been 10 business days since initial post hospital discharge outreach attempt.  Barrington Ellison RN,CCM,CDE [place] Management Coordinator Office Phone [phone] Office Fax [phone]

## 2019-05-13 MED FILL — METHADONE HCL 10 MG TABLET: 10 | 30 days supply | Qty: 90 | Fill #0

## 2019-05-22 MED FILL — AMLODIPINE-BENAZEPRIL 5-10: 5-10 | 30 days supply | Qty: 30 | Fill #3

## 2019-05-27 ENCOUNTER — Ambulatory Visit (INDEPENDENT_AMBULATORY_CARE_PROVIDER_SITE_OTHER): Payer: No Typology Code available for payment source

## 2019-05-27 ENCOUNTER — Encounter: Payer: Self-pay | Admitting: Primary Care

## 2019-05-27 ENCOUNTER — Inpatient Hospital Stay: Payer: No Typology Code available for payment source | Admitting: Primary Care

## 2019-05-27 ENCOUNTER — Ambulatory Visit: Payer: No Typology Code available for payment source | Admitting: Primary Care

## 2019-05-27 ENCOUNTER — Telehealth: Payer: Self-pay | Admitting: Pulmonary Disease

## 2019-05-27 ENCOUNTER — Other Ambulatory Visit: Payer: Self-pay

## 2019-05-27 VITALS — BP 124/70 | HR 67 | Ht 69.0 in | Wt 223.6 lb

## 2019-05-27 DIAGNOSIS — J189 Pneumonia, unspecified organism: Secondary | ICD-10-CM

## 2019-05-27 DIAGNOSIS — D352 Benign neoplasm of pituitary gland: Secondary | ICD-10-CM

## 2019-05-27 DIAGNOSIS — J9601 Acute respiratory failure with hypoxia: Secondary | ICD-10-CM

## 2019-05-27 NOTE — Progress Notes (Addendum)
@Patient  ID: Roger Durham, male    DOB: [birth year], 63 y.o.   MRN: [id number]  Chief Complaint  Patient presents with   Hospitalization Follow-up    HFU PNA.  pt states he is feeling much better since discharge, does note some residual weakness/fatigue with exertion.     Referring provider: Lavone Orn, MD  HPI: 63 year old male, former smoker. PMH significant for CAP, acute respiratory failure, HTN, hypothyroidism, prostate cancer, chronic pain. Seen for initial consult in patient by Dr. Ander Slade for right sided multifocal pneumonia.   Hospital course 8/9-8/15/20: 9 day history of sinus pain, shortness of breath and cough. Seen originally by PCP and dx with pneumonia. No improvement with "cephalosporin"; repeat CXR showed multifocal pneumonia and was given doxycyline. CT chest revealed extensive patchy consolidation right upper/middle lobes, extensive patchy ground glass opacity right lung. Consistent with multifocal pneumonia, no evidence of ILD. Left lower lobe 37mm pulmonary nodule. Mild mediastinal and right hilar lymphadenopathy. CT maxillofacial showed incidental finding of pituitary adenoma, plan to follow-up with neurosurgery outpatient with Dr. Ronnald Ramp and refer to endocrinology. Needs repeat CT chest in 6 weeks ( due around 06/07/19) and PFTs.  05/27/2019 Patient presents today for hospital follow-up/PNA. Discharged home on 8/15. Accompanied today by male family member. He is doing well, no acute complaints except for sinus pain. His breathing is stable. No fever, cough, mucus production or shortness of breath. He has been working in the yard and denies significant fatigue. He has been taking tylenol and decongestant with improvement for sinus congestion/pain. Gained some weight from steroids. He would like to wait and see his PCP, which is scheduled for September 21st, to discuss referral to neurosurgeon/endocrine.   Outside Spirometry 06/11/15 - FVC 2.83 (66%), FEV1 1.43 (44%)  ratio, 51 (67%) Severe obstruction/modreate restriction  Allergies  Allergen Reactions   Penicillins Other (See Comments)    Tingling in mouth. Intolerance.    Immunization History  Administered Date(s) Administered   Zoster Recombinat (Shingrix) 08/07/2018, 10/15/2018    Past Medical History:  Diagnosis Date   Cancer ([place])    bx. 2'14- dx. Prostate cancer.-surgery planned   Chronic back pain    lumbar 3.4.5 level -tx. pain clinic-Dr. R. Rauch[facility]   Hematuria    off and on   Hypertension    Hypothyroidism    tx. Levothyroxine   Neuromuscular disorder (HCC)    due to back pain issues-left leg"cramping, burning,sharp"   Prostate cancer ([place])    Seasonal allergies    environmental allergies    Tobacco History: Social History   Tobacco Use  Smoking Status Former Smoker   Packs/day: 0.50   Years: 25.00   Pack years: 12.50   Types: Cigarettes   Quit date: 09/12/2009   Years since quitting: 9.7  Smokeless Tobacco Never Used  Tobacco Comment   uses in past yr-electronic cigarette   Counseling given: Not Answered Comment: uses in past yr-electronic cigarette   Outpatient Medications Prior to Visit  Medication Sig Dispense Refill   acetaminophen (TYLENOL) 500 MG tablet Take 1,000 mg by mouth every 6 (six) hours as needed for pain.     amLODipine-benazepril (LOTREL) 5-10 MG per capsule Take 1 capsule by mouth every morning.     aspirin 81 MG tablet Take 1 tablet (81 mg total) by mouth daily. 30 tablet    atorvastatin (LIPITOR) 10 MG tablet Take 10 mg by mouth at bedtime.     diphenhydrAMINE (BENADRYL) 25 mg capsule Take  25 mg by mouth as needed for allergies.     EPINEPHrine 0.3 mg/0.3 mL IJ SOAJ injection Inject 0.3 mg into the muscle as needed for anaphylaxis or allergies.     levothyroxine (SYNTHROID) 112 MCG tablet Take 112 mcg by mouth daily before breakfast.      methadone (DOLOPHINE) 10 MG tablet Take 10 mg by  mouth 3 (three) times daily. For pain by pain management center     beclomethasone (QVAR) 80 MCG/ACT inhaler Inhale 1 puff into the lungs as needed.     predniSONE (DELTASONE) 10 MG tablet Take 1 tablet (10 mg total) by mouth daily with breakfast. (Patient not taking: Reported on 05/27/2019) 7 tablet 0   No facility-administered medications prior to visit.    Review of Systems  Review of Systems  Constitutional: Negative.   HENT: Positive for congestion and sinus pain.   Respiratory: Negative for cough, shortness of breath and wheezing.   Cardiovascular: Negative.    Physical Exam  BP 124/70 (BP Location: Left Arm, Cuff Size: Normal)    Pulse 67    Ht 5\' 9"  (1.753 m)    Wt 223 lb 9.6 oz (101.4 kg)    SpO2 98%    BMI 33.02 kg/m  Physical Exam Constitutional:      General: He is not in acute distress.    Appearance: Normal appearance. He is not ill-appearing.  HENT:     Head: Normocephalic and atraumatic.  Cardiovascular:     Rate and Rhythm: Normal rate and regular rhythm.  Pulmonary:     Effort: Pulmonary effort is normal.     Breath sounds: Normal breath sounds.     Comments:  Lungs mostly clear t/o, fine rales right base Skin:    General: Skin is warm and dry.  Neurological:     General: No focal deficit present.     Mental Status: He is alert and oriented to person, place, and time. Mental status is at baseline.  Psychiatric:        Mood and Affect: Mood normal.        Behavior: Behavior normal.        Thought Content: Thought content normal.        Judgment: Judgment normal.      Lab Results:  CBC    Component Value Date/Time   WBC 11.2 (H) 04/26/2019 0716   RBC 3.94 (L) 04/26/2019 0716   HGB 11.0 (L) 04/26/2019 0716   HCT 33.8 (L) 04/26/2019 0716   PLT 477 (H) 04/26/2019 0716   MCV 85.8 04/26/2019 0716   MCH 27.9 04/26/2019 0716   MCHC 32.5 04/26/2019 0716   RDW 14.6 04/26/2019 0716   LYMPHSABS 2.3 04/26/2019 0716   MONOABS 1.0 04/26/2019 0716   EOSABS  0.4 04/26/2019 0716   BASOSABS 0.2 (H) 04/26/2019 0716    BMET    Component Value Date/Time   NA 142 04/26/2019 0716   K 4.5 04/26/2019 0716   CL 104 04/26/2019 0716   CO2 29 04/26/2019 0716   GLUCOSE 97 04/26/2019 0716   BUN 21 04/26/2019 0716   CREATININE 0.77 04/26/2019 0716   CALCIUM 8.5 (L) 04/26/2019 0716   GFRNONAA >60 04/26/2019 0716   GFRAA >60 04/26/2019 0716    BNP No results found for: BNP  ProBNP No results found for: PROBNP  Imaging: Dg Chest 2 View  Result Date: 05/27/2019 CLINICAL DATA:  63 year old male with pneumonia. EXAM: CHEST - 2 VIEW COMPARISON:  Chest radiograph dated  04/25/2019 FINDINGS: Clusters of airspace densities primarily in the right lung most consistent with infiltrate. Overall significant interval improvement of the right lung infiltrate compared to the prior radiograph. The left lung is clear. No pleural effusion or pneumothorax. The cardiac silhouette is within normal limits. Degenerative changes of the spine. No acute osseous pathology. IMPRESSION: Right lung infiltrates. Significant interval improvement of the aeration of the lungs since the prior radiograph. Electronically Signed   By: Anner Crete M.D.   On: 05/27/2019 15:45     Assessment & Plan:   Multifocal pneumonia - Clinically improved - Lungs mostly clear t/o, fine rales right base - CXR today showed right lung infiltrates. Significant interval improvement of the aeration of the lungs since the prior  - Recommend repeating CXR in 4 weeks, if not resolved needs CT chest   Acute respiratory failure with hypoxia (HCC) - Resolved, O2 98% RA  Pituitary adenoma ([place]) - CT maxillofacial showed incidental finding of pituitary adenoma, plan to follow-up with neurosurgery outpatient with Dr. Ronnald Ramp and refer to endocrinology - Patient wants to hold off on referral and speak with PCP    Martyn Ehrich, NP 05/27/2019

## 2019-05-27 NOTE — Progress Notes (Signed)
Patient CXR showed cluster of airspace densities in right lung. Overall improved but not resolved. Recommend repeat CXR in 4 weeks, if still not resolved then will needs CT chest (please order CXR)

## 2019-05-27 NOTE — Assessment & Plan Note (Signed)
-   Clinically improved - Lungs mostly clear t/o, fine rales right base - CXR today showed right lung infiltrates. Significant interval improvement of the aeration of the lungs since the prior  - Recommend repeating CXR in 4 weeks, if not resolved needs CT chest

## 2019-05-27 NOTE — Patient Instructions (Addendum)
Orders: - CXR today re: multifocal pneumonia  - Need to obtain prior PFT/spirometry for review from [facility]   Recommendations: - Follow up with PCP regarding pituitary adenoma, recommended neurosurgery and endocrine referral per hospital discharge summary   Follow-up: - As needed if symptoms return/worsen

## 2019-05-27 NOTE — Telephone Encounter (Signed)
Advised pt of results. Pt understood and nothing further is needed.     Notes recorded by Martyn Ehrich, NP on 05/27/2019 at 4:16 PM EDT  Patient CXR showed cluster of airspace densities in right lung. Overall improved but not resolved. Recommend repeat CXR in 4 weeks, if still not resolved then will needs CT chest (please order CXR)  Chest xray ordered for 4 weeks.

## 2019-05-27 NOTE — Assessment & Plan Note (Signed)
-   CT maxillofacial showed incidental finding of pituitary adenoma, plan to follow-up with neurosurgery outpatient with Dr. Ronnald Ramp and refer to endocrinology - Patient wants to hold off on referral and speak with PCP

## 2019-05-27 NOTE — Assessment & Plan Note (Signed)
-   Resolved, O2 98% RA

## 2019-05-28 ENCOUNTER — Telehealth: Payer: Self-pay | Admitting: *Deleted

## 2019-05-29 ENCOUNTER — Other Ambulatory Visit: Payer: No Typology Code available for payment source

## 2019-06-12 MED FILL — FLUARIX QUADRIVALENT 0.5 ML: 0.5 | 1 days supply | Qty: 1 | Fill #0

## 2019-06-13 MED FILL — ATORVASTATIN 20 MG TABLET: 20 | 90 days supply | Qty: 90 | Fill #3

## 2019-06-13 MED FILL — LEVOTHYROXINE 112 MCG TAB: 112 | 90 days supply | Qty: 90 | Fill #2

## 2019-06-20 MED FILL — METHADONE HCL 10 MG TABLET: 10 | 30 days supply | Qty: 90 | Fill #0

## 2019-06-21 MED FILL — PNEUMOVAX 23 SYRINGE: 25 | 1 days supply | Qty: 1 | Fill #0

## 2019-06-21 NOTE — Telephone Encounter (Signed)
     05/27/19 5:21 PM Note   Advised pt of results. Pt understood and nothing further is needed.     Notes recorded by Martyn Ehrich, NP on 05/27/2019 at 4:16 PM EDT  Patient CXR showed cluster of airspace densities in right lung. Overall improved but not resolved. Recommend repeat CXR in 4 weeks, if still not resolved then will needs CT chest (please order CXR)  Chest xray ordered for 4 weeks.

## 2019-06-25 MED FILL — AMLODIPINE-BENAZEPRIL 5-10: 5-10 | 30 days supply | Qty: 30 | Fill #4

## 2019-07-05 ENCOUNTER — Other Ambulatory Visit: Payer: Self-pay | Admitting: Internal Medicine

## 2019-07-05 ENCOUNTER — Ambulatory Visit
Admission: RE | Admit: 2019-07-05 | Discharge: 2019-07-05 | Disposition: A | Discharge status: Home / Self Care | Payer: No Typology Code available for payment source | Source: Ambulatory Visit | Attending: Internal Medicine | Admitting: Internal Medicine

## 2019-07-05 DIAGNOSIS — J189 Pneumonia, unspecified organism: Secondary | ICD-10-CM

## 2019-07-29 MED FILL — AMLODIPINE-BENAZEPRIL 5-10: 5-10 | 30 days supply | Qty: 30 | Fill #5

## 2019-07-29 MED FILL — METHADONE HCL 10 MG TABLET: 10 | 30 days supply | Qty: 90 | Fill #0

## 2019-08-07 LAB — PSA: PSA: 0.03

## 2019-08-07 LAB — TESTOSTERONE: Testosterone: 193.4

## 2019-08-07 LAB — TSH: TSH: 6.56 — AB (ref 0.41–5.90)

## 2019-08-07 LAB — BASIC METABOLIC PANEL
BUN: 19 (ref 4–21)
Creatinine: 0.8 (ref 0.6–1.3)

## 2019-08-26 MED FILL — AMLODIPINE-BENAZEPRIL 5-10: 5-10 | 90 days supply | Qty: 90 | Fill #0

## 2019-09-04 MED FILL — METHADONE HCL 10 MG TABLET: 10 | 30 days supply | Qty: 90 | Fill #0

## 2019-09-11 MED FILL — ATORVASTATIN 20 MG TABLET: 20 | 90 days supply | Qty: 90 | Fill #0

## 2019-09-11 MED FILL — LEVOTHYROXINE SODIUM 112 MC: 112 | 90 days supply | Qty: 90 | Fill #3

## 2019-10-14 MED FILL — METHADONE HCL 10 MG TABLET: 10 | 30 days supply | Qty: 90 | Fill #0

## 2019-10-21 ENCOUNTER — Ambulatory Visit: Payer: No Typology Code available for payment source

## 2019-11-18 MED FILL — METHADONE HCL 10 MG TABLET: 10 | 30 days supply | Qty: 90 | Fill #0

## 2019-11-25 MED FILL — AMLODIPINE-BENAZEPRIL 5-10: 5-10 | 90 days supply | Qty: 90 | Fill #1

## 2019-12-16 MED FILL — ATORVASTATIN 20 MG TABLET: 20 | 90 days supply | Qty: 90 | Fill #1

## 2019-12-16 MED FILL — LEVOTHYROXINE SODIUM 112 MC: 112 | 90 days supply | Qty: 90 | Fill #0

## 2019-12-24 MED FILL — METHADONE HCL 10 MG TABLET: 10 | 30 days supply | Qty: 90 | Fill #0

## 2020-02-03 MED FILL — METHADONE HCL 10 MG TABLET: 10 | 30 days supply | Qty: 90 | Fill #0

## 2020-02-06 MED FILL — ALBUTEROL SULFATE HFA 108 (: 108 (90 BAS | 17 days supply | Qty: 9 | Fill #0

## 2020-03-09 MED FILL — METHADONE HCL 10 MG TABLET: 10 | 30 days supply | Qty: 90 | Fill #0

## 2020-03-10 ENCOUNTER — Other Ambulatory Visit (HOSPITAL_COMMUNITY): Payer: Self-pay | Admitting: Internal Medicine

## 2020-03-10 MED FILL — LEVOTHYROXINE SODIUM 112 MC: 112 | 90 days supply | Qty: 90 | Fill #1

## 2020-03-10 MED FILL — ATORVASTATIN 20 MG TABLET: 20 | 90 days supply | Qty: 90 | Fill #0

## 2020-04-15 MED FILL — METHADONE HCL 10 MG TABLET: 10 | 30 days supply | Qty: 90 | Fill #0

## 2020-04-27 ENCOUNTER — Encounter: Payer: Self-pay | Admitting: Endocrinology

## 2020-05-11 MED FILL — AMLODIPINE-BENAZEPRIL 5-10: 5-10 | 90 days supply | Qty: 90 | Fill #3

## 2020-05-25 MED FILL — METHADONE HCL 10 MG TABLET: 10 | 30 days supply | Qty: 90 | Fill #0

## 2020-06-08 MED FILL — ATORVASTATIN 20 MG TABLET: 20 | 90 days supply | Qty: 90 | Fill #1

## 2020-06-08 MED FILL — LEVOTHYROXINE SODIUM 112 MC: 112 | 90 days supply | Qty: 90 | Fill #0

## 2020-06-23 ENCOUNTER — Encounter: Payer: Self-pay | Admitting: Endocrinology

## 2020-06-23 ENCOUNTER — Ambulatory Visit: Payer: No Typology Code available for payment source | Admitting: Endocrinology

## 2020-06-23 ENCOUNTER — Other Ambulatory Visit: Payer: Self-pay

## 2020-06-23 VITALS — BP 136/90 | HR 68 | Ht 67.25 in | Wt 220.6 lb

## 2020-06-23 DIAGNOSIS — Z23 Encounter for immunization: Secondary | ICD-10-CM

## 2020-06-23 DIAGNOSIS — D352 Benign neoplasm of pituitary gland: Secondary | ICD-10-CM

## 2020-06-23 NOTE — Progress Notes (Signed)
Subjective:    Patient ID: Roger Durham, male    DOB: 04-01-1956, 64 y.o.   MRN: [id number]  HPI Pt is ref by Dr Laurann Montana.  He was dx'ed with pituitary macroadenoma in 2020.  He saw [facility] in 2020, who told pt this was "nothing to worry about."  He had XRT for prostate cancer, but he no longer goes there.  Main symptoms are fatigue and weight gain.  He has been on synthroid since 2016.    Past Medical History:  Diagnosis Date  . Cancer ([place])    bx. 2'14- dx. Prostate cancer.-surgery planned  . Chronic back pain    lumbar 3.4.5 level -tx. pain clinic-Dr. R. Rauch-[facility]  . Hematuria    off and on  . Hypertension   . Hypothyroidism    tx. Levothyroxine  . Neuromuscular disorder ([place])    due to back pain issues-left leg"cramping, burning,sharp"  . Prostate cancer ([place])   . Seasonal allergies    environmental allergies    Past Surgical History:  Procedure Laterality Date  . APPENDECTOMY  1982  . BACK SURGERY     multiple back surgery-retained hardware '08-Ray cage  . CYSTOSCOPY WITH BIOPSY N/A 10/12/2018   Procedure: CYSTOSCOPY WITH BLADDER BIOPSY/ FULGURATION BIOPSY 0.5 to 2 cm;  Surgeon: Festus Aloe, MD;  Location: [facility];  Service: Urology;  Laterality: N/A;  . KNEE ARTHROSCOPY  1980's   left knee-torn menicus  . LYMPHADENECTOMY Bilateral 11/21/2012   Procedure: LYMPHADENECTOMY;  Surgeon: Bernestine Amass, MD;  Location: [facility];  Service: Urology;  Laterality: Bilateral;  . NASAL SEPTOPLASTY W/ TURBINOPLASTY  1990's   Seasonal allergies  . ROBOT ASSISTED LAPAROSCOPIC RADICAL PROSTATECTOMY N/A 11/21/2012   Procedure: ROBOTIC ASSISTED LAPAROSCOPIC RADICAL PROSTATECTOMY;  Surgeon: Bernestine Amass, MD;  Location: [facility];  Service: Urology;  Laterality: N/A;  WITH BILATERAL PELVIC LYMPH NODE DISSECTION     Social History   Socioeconomic History  . Marital status: Married    Spouse name: Not on file  . Number of children: Not on file  .  Years of education: Not on file  . Highest education level: Not on file  Occupational History  . Not on file  Tobacco Use  . Smoking status: Former Smoker    Packs/day: 0.50    Years: 25.00    Pack years: 12.50    Types: Cigarettes    Quit date: 09/12/2009    Years since quitting: 10.7  . Smokeless tobacco: Never Used  . Tobacco comment: uses in past yr-electronic cigarette  Vaping Use  . Vaping Use: Never used  Substance and Sexual Activity  . Alcohol use: Not Currently  . Drug use: No  . Sexual activity: Yes  Other Topics Concern  . Not on file  Social History Narrative  . Not on file   Social Determinants of Health   Financial Resource Strain:   . Difficulty of Paying Living Expenses: Not on file  Food Insecurity:   . Worried About Charity fundraiser in the Last Year: Not on file  . Ran Out of Food in the Last Year: Not on file  Transportation Needs:   . Lack of Transportation (Medical): Not on file  . Lack of Transportation (Non-Medical): Not on file  Physical Activity:   . Days of Exercise per Week: Not on file  . Minutes of Exercise per Session: Not on file  Stress:   . Feeling of Stress : Not  on file  Social Connections:   . Frequency of Communication with Friends and Family: Not on file  . Frequency of Social Gatherings with Friends and Family: Not on file  . Attends Religious Services: Not on file  . Active Member of Clubs or Organizations: Not on file  . Attends Archivist Meetings: Not on file  . Marital Status: Not on file  Intimate Partner Violence:   . Fear of Current or Ex-Partner: Not on file  . Emotionally Abused: Not on file  . Physically Abused: Not on file  . Sexually Abused: Not on file    Current Outpatient Medications on File Prior to Visit  Medication Sig Dispense Refill  . acetaminophen (TYLENOL) 500 MG tablet Take 1,000 mg by mouth every 6 (six) hours as needed for pain.    Marland Kitchen amLODipine-benazepril (LOTREL) 5-10 MG per capsule  Take 1 capsule by mouth every morning.    Marland Kitchen aspirin 81 MG tablet Take 1 tablet (81 mg total) by mouth daily. 30 tablet   . atorvastatin (LIPITOR) 10 MG tablet Take 10 mg by mouth at bedtime.    . diphenhydrAMINE (BENADRYL) 25 mg capsule Take 25 mg by mouth as needed for allergies.    Marland Kitchen EPINEPHrine 0.3 mg/0.3 mL IJ SOAJ injection Inject 0.3 mg into the muscle as needed for anaphylaxis or allergies.    Marland Kitchen levothyroxine (SYNTHROID) 112 MCG tablet Take 112 mcg by mouth daily before breakfast.     . methadone (DOLOPHINE) 10 MG tablet Take 10 mg by mouth 3 (three) times daily. For pain by pain management center     No current facility-administered medications on file prior to visit.    Allergies  Allergen Reactions  . Penicillins Other (See Comments)    Tingling in mouth. Intolerance.    Family History  Problem Relation Age of Onset  . Cancer Brother        liver    BP 136/90 (BP Location: Left Arm, Patient Position: Sitting, Cuff Size: Normal)   Pulse 68   Ht 5' 7.25" (1.708 m)   Wt 220 lb 9.6 oz (100.1 kg)   SpO2 94%   BMI 34.29 kg/m    Review of Systems denies polyuria, loss of smell, headache, visual loss, gynecomastia, and n/v.      Objective:   Physical Exam VS: see vs page GEN: no distress HEAD: head: no deformity eyes: no periorbital swelling, no proptosis external nose and ears are normal NECK: supple, thyroid is not enlarged CHEST WALL: no deformity LUNGS: clear to auscultation CV: reg rate and rhythm, no murmur GENITALIA:  Testicles are small and soft.   MUSCULOSKELETAL: gait is normal and steady.  No acromegaly.   EXTEMITIES: 1+ bilat leg edema NEURO:  cn 2-12 grossly intact.  sensation is intact to touch on all 4's SKIN:  Normal texture and temperature.  No rash or suspicious lesion is visible.  Normal male hair pattern.   NODES:  None palpable at the neck PSYCH: alert, well-oriented.  Does not appear anxious nor depressed.    I have reviewed outside  records, and summarized: Pt was noted to have pituitary mass, and referred here.  D/c summary says pt was advised to f/u with NS. He also saw urol: no other rx, other than prostatect and XRT.    MRI (2020): 22 x 20 x 18 mm sellar and suprasellar mass consistent with macro adenoma. T1 hyperintensity in the lower mass from proteinaceous cyst or hemorrhage, please correlate for  apoplexy symptoms. There is upward mass effect on the chiasm.    Assessment & Plan:  Pituitary adenoma, new to me.  He needs f/u MRI, and pit function testing  Patient Instructions  Please schedule an "ACTH stimulation test."  We'll check the testosterone then, also.    Let's recheck the MRI.  you will receive a phone call, about a day and time for an appointment.

## 2020-06-23 NOTE — Patient Instructions (Addendum)
Please schedule an "ACTH stimulation test."  We'll check the testosterone then, also.    Let's recheck the MRI.  you will receive a phone call, about a day and time for an appointment.

## 2020-06-24 ENCOUNTER — Telehealth: Payer: Self-pay | Admitting: Endocrinology

## 2020-06-24 NOTE — Telephone Encounter (Signed)
Patient requests to be called at ph# [phone] with the PA number for the MRI Dr. Loanne Drilling ordered with or without contrast

## 2020-06-29 ENCOUNTER — Other Ambulatory Visit (HOSPITAL_COMMUNITY): Payer: Self-pay | Admitting: Neurosurgery

## 2020-06-29 MED FILL — METHADONE HCL 10 MG TABLET: 10 | 30 days supply | Qty: 90 | Fill #0

## 2020-07-14 ENCOUNTER — Other Ambulatory Visit: Payer: Self-pay

## 2020-07-14 ENCOUNTER — Ambulatory Visit
Admission: RE | Admit: 2020-07-14 | Discharge: 2020-07-14 | Disposition: A | Discharge status: Home / Self Care | Payer: No Typology Code available for payment source | Source: Ambulatory Visit | Attending: Endocrinology | Admitting: Endocrinology

## 2020-07-14 DIAGNOSIS — D352 Benign neoplasm of pituitary gland: Secondary | ICD-10-CM

## 2020-07-14 MED ORDER — GADOBENATE DIMEGLUMINE 529 MG/ML IV SOLN
10.0000 mL | Freq: Once | INTRAVENOUS | Status: AC | PRN
  Administered 2020-07-14: 10 mL via INTRAVENOUS

## 2020-07-16 ENCOUNTER — Ambulatory Visit: Payer: No Typology Code available for payment source

## 2020-07-16 ENCOUNTER — Other Ambulatory Visit: Payer: No Typology Code available for payment source

## 2020-07-23 ENCOUNTER — Other Ambulatory Visit: Payer: Self-pay | Admitting: *Deleted

## 2020-07-23 ENCOUNTER — Other Ambulatory Visit: Payer: Self-pay

## 2020-07-23 ENCOUNTER — Ambulatory Visit: Payer: No Typology Code available for payment source

## 2020-07-23 ENCOUNTER — Other Ambulatory Visit: Payer: Self-pay | Admitting: Endocrinology

## 2020-07-23 ENCOUNTER — Other Ambulatory Visit (INDEPENDENT_AMBULATORY_CARE_PROVIDER_SITE_OTHER): Payer: No Typology Code available for payment source

## 2020-07-23 DIAGNOSIS — D352 Benign neoplasm of pituitary gland: Secondary | ICD-10-CM

## 2020-07-23 LAB — BASIC METABOLIC PANEL
BUN: 18 mg/dL (ref 6–23)
CO2: 30 mEq/L (ref 19–32)
Calcium: 9.1 mg/dL (ref 8.4–10.5)
Chloride: 102 mEq/L (ref 96–112)
Creatinine, Ser: 0.81 mg/dL (ref 0.40–1.50)
GFR: 92.15 mL/min (ref >60.00)
Glucose, Bld: 103 mg/dL — ABNORMAL HIGH (ref 70–99)
Potassium: 4.2 mEq/L (ref 3.5–5.1)
Sodium: 139 mEq/L (ref 135–145)

## 2020-07-23 LAB — CORTISOL
Cortisol, Plasma: 11.8 ug/dL
Cortisol, Plasma: 24.4 ug/dL

## 2020-07-23 LAB — TSH: TSH: 5.33 u[IU]/mL — ABNORMAL HIGH (ref 0.35–4.50)

## 2020-07-23 LAB — T4, FREE: Free T4: 0.73 ng/dL (ref 0.60–1.60)

## 2020-07-23 MED ORDER — COSYNTROPIN 0.25 MG IJ SOLR
0.2500 mg | Freq: Once | INTRAMUSCULAR | Status: AC
  Administered 2020-07-23: 0.25 mg via INTRAVENOUS

## 2020-07-24 LAB — TESTOSTERONE,FREE AND TOTAL
Testosterone, Free: 5.6 pg/mL — ABNORMAL LOW (ref 6.6–18.1)
Testosterone: 237 ng/dL — ABNORMAL LOW (ref 264–916)

## 2020-07-24 LAB — FOLLICLE STIMULATING HORMONE: FSH: 17.9 m[IU]/mL (ref 1.4–18.1)

## 2020-07-24 LAB — LUTEINIZING HORMONE: LH: 10.44 m[IU]/mL — ABNORMAL HIGH (ref 1.50–9.30)

## 2020-07-28 LAB — EXTRA SPECIMEN

## 2020-07-28 LAB — INSULIN-LIKE GROWTH FACTOR
IGF-I, LC/MS: 73 ng/mL (ref 41–279)
Z-Score (Male): -0.9 SD (ref ?–2.0)

## 2020-07-28 LAB — ACTH: C206 ACTH: 20 pg/mL (ref 6–50)

## 2020-07-28 LAB — PROLACTIN: Prolactin: 5.9 ng/mL (ref 2.0–18.0)

## 2020-07-29 ENCOUNTER — Other Ambulatory Visit: Payer: Self-pay | Admitting: Endocrinology

## 2020-07-29 LAB — ALPHA SUBUNIT (FREE): Alpha Subunit (Free): 0.77 ng/mL

## 2020-07-29 MED ORDER — LEVOTHYROXINE SODIUM 125 MCG PO TABS: 125.0000 ug | ORAL_TABLET | Freq: Every day | ORAL | 3 refills | Status: DC

## 2020-07-30 MED FILL — LEVOTHYROXINE SODIUM 125 MC: 125 | 90 days supply | Qty: 90 | Fill #0

## 2020-07-31 LAB — ARGININE VASOPRESSIN HORMONE
ADH: <0.8 pg/mL (ref 0.0–4.7)
Osmolality Meas: 281 mOsmol/kg (ref 280–301)

## 2020-08-07 MED FILL — METHADONE HCL 10 MG TABLET: 10 | 30 days supply | Qty: 90 | Fill #0

## 2020-08-18 ENCOUNTER — Other Ambulatory Visit (HOSPITAL_COMMUNITY): Payer: Self-pay | Admitting: Internal Medicine

## 2020-08-18 MED FILL — AMLODIPINE-BENAZEPRIL 5-10: 5-10 | 90 days supply | Qty: 90 | Fill #0

## 2020-09-07 MED FILL — ATORVASTATIN CALCIUM 20 MG: 20 | 90 days supply | Qty: 90 | Fill #2

## 2020-09-15 MED FILL — METHADONE HCL 10 MG TABLET: 10 | 30 days supply | Qty: 90 | Fill #0

## 2020-10-05 ENCOUNTER — Other Ambulatory Visit (HOSPITAL_COMMUNITY): Payer: Self-pay | Admitting: Neurosurgery

## 2020-10-19 MED FILL — METHADONE HCL 10 MG TABLET: 10 | 30 days supply | Qty: 90 | Fill #0

## 2020-10-19 MED FILL — LEVOTHYROXINE SODIUM 125 MC: 125 | 90 days supply | Qty: 90 | Fill #1

## 2020-11-11 MED FILL — AMLODIPINE-BENAZEPRIL 5-10: 5-10 | 90 days supply | Qty: 90 | Fill #1

## 2020-11-18 MED FILL — METHADONE HCL 10 MG TABLET: 10 | 30 days supply | Qty: 90 | Fill #0

## 2020-12-03 MED FILL — ATORVASTATIN CALCIUM 20 MG: 20 | 90 days supply | Qty: 90 | Fill #3

## 2020-12-22 ENCOUNTER — Other Ambulatory Visit (HOSPITAL_COMMUNITY): Payer: Self-pay

## 2020-12-22 ENCOUNTER — Other Ambulatory Visit (HOSPITAL_COMMUNITY): Payer: Self-pay | Admitting: Neurosurgery

## 2020-12-23 ENCOUNTER — Other Ambulatory Visit (HOSPITAL_COMMUNITY): Payer: Self-pay | Admitting: Neurosurgery

## 2020-12-23 ENCOUNTER — Other Ambulatory Visit (HOSPITAL_COMMUNITY): Payer: Self-pay

## 2020-12-23 MED ORDER — METHADONE HCL 10 MG PO TABS
10.0000 mg | ORAL_TABLET | Freq: Three times a day (TID) | ORAL | 0 refills | Status: DC
  Filled 2020-12-23: qty 90, 30d supply, fill #0

## 2020-12-24 ENCOUNTER — Other Ambulatory Visit (HOSPITAL_COMMUNITY): Payer: Self-pay

## 2020-12-29 ENCOUNTER — Other Ambulatory Visit (HOSPITAL_COMMUNITY): Payer: Self-pay

## 2020-12-30 ENCOUNTER — Other Ambulatory Visit (HOSPITAL_COMMUNITY): Payer: Self-pay

## 2020-12-31 ENCOUNTER — Other Ambulatory Visit (HOSPITAL_COMMUNITY): Payer: Self-pay

## 2020-12-31 MED ORDER — EPINEPHRINE 0.3 MG/0.3ML IJ SOAJ
INTRAMUSCULAR | 1 refills | Status: AC
  Filled 2020-12-31: qty 2, 30d supply, fill #0

## 2021-01-01 ENCOUNTER — Other Ambulatory Visit (HOSPITAL_COMMUNITY): Payer: Self-pay

## 2021-01-18 ENCOUNTER — Other Ambulatory Visit (HOSPITAL_COMMUNITY): Payer: Self-pay

## 2021-01-18 MED ORDER — METHADONE HCL 10 MG PO TABS
10.0000 mg | ORAL_TABLET | Freq: Three times a day (TID) | ORAL | 0 refills | Status: AC | PRN
Start: 2021-02-20 — End: ?
  Filled 2021-03-01: qty 90, 30d supply, fill #0

## 2021-01-18 MED ORDER — METHADONE HCL 10 MG PO TABS
10.0000 mg | ORAL_TABLET | Freq: Three times a day (TID) | ORAL | 0 refills | Status: DC | PRN
  Filled 2021-01-25: qty 90, 30d supply, fill #0

## 2021-01-18 MED ORDER — METHADONE HCL 10 MG PO TABS
10.0000 mg | ORAL_TABLET | Freq: Three times a day (TID) | ORAL | 0 refills | Status: AC
  Filled 2021-04-05: qty 90, 30d supply, fill #0

## 2021-01-21 ENCOUNTER — Other Ambulatory Visit (HOSPITAL_COMMUNITY): Payer: Self-pay

## 2021-01-21 MED FILL — Levothyroxine Sodium Tab 125 MCG: ORAL | 90 days supply | Qty: 90 | Fill #0 | Status: AC

## 2021-01-25 ENCOUNTER — Other Ambulatory Visit (HOSPITAL_COMMUNITY): Payer: Self-pay

## 2021-01-26 ENCOUNTER — Other Ambulatory Visit (HOSPITAL_COMMUNITY): Payer: Self-pay

## 2021-02-09 ENCOUNTER — Other Ambulatory Visit (HOSPITAL_COMMUNITY): Payer: Self-pay

## 2021-02-09 MED ORDER — DICLOFENAC SODIUM 1 % EX GEL
CUTANEOUS | 11 refills | Status: DC
  Filled 2021-02-09: qty 300, 30d supply, fill #0

## 2021-02-09 MED ORDER — AMOXICILLIN 500 MG PO CAPS
500.0000 mg | ORAL_CAPSULE | Freq: Three times a day (TID) | ORAL | 0 refills | Status: DC
  Filled 2021-02-09: qty 21, 7d supply, fill #0

## 2021-02-10 ENCOUNTER — Other Ambulatory Visit (HOSPITAL_COMMUNITY): Payer: Self-pay

## 2021-02-14 MED FILL — Amlodipine Besylate-Benazepril HCl Cap 5-10 MG: ORAL | 90 days supply | Qty: 90 | Fill #0 | Status: AC

## 2021-02-15 ENCOUNTER — Other Ambulatory Visit (HOSPITAL_COMMUNITY): Payer: Self-pay

## 2021-03-01 ENCOUNTER — Other Ambulatory Visit (HOSPITAL_COMMUNITY): Payer: Self-pay

## 2021-03-01 IMAGING — MR MRI HEAD WITHOUT AND WITH CONTRAST
18 of 24 series · 38 of 48 positions shown · IV contrast (gadavist)
Comparison: Sinus CT from 2 days ago

CLINICAL DATA: Incidental pituitary mass

EXAM:
MRI HEAD WITHOUT AND WITH CONTRAST
TECHNIQUE: Multiplanar, multiecho pulse sequences of the brain and surrounding
structures were obtained without and with intravenous contrast.
CONTRAST:  10 cc Gadavist intravenous

[Series 5: DWI · axial · 3.0mm · 0.92mm/px · z∈[-32,+120]mm · 6 of 104 slices shown (1 of 2)]
[im 1/104]
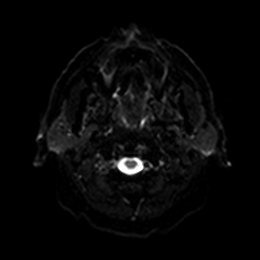
[im 21/104]
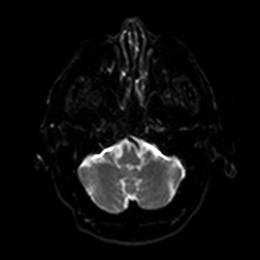
[im 42/104]
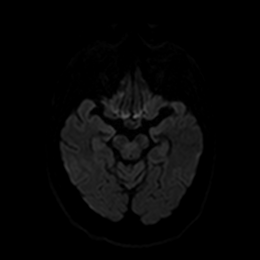
[im 62/104]
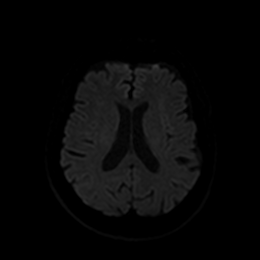
[im 83/104]
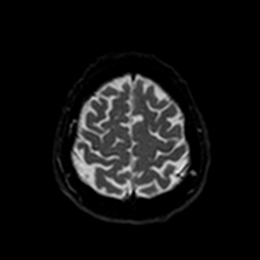
[im 104/104]
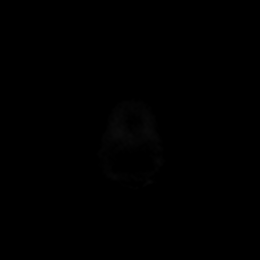

[Series 6: DWI · axial · 3.0mm · 0.92mm/px · z∈[-32,+120]mm · 2 of 52 slices shown (2 of 2)]
[im 1/52]
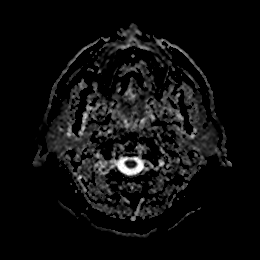
[im 52/52]
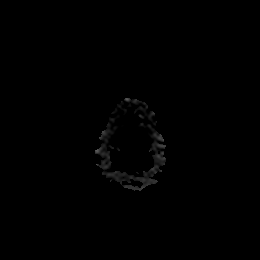

[Series 7: T1 · sagittal · 5.0mm · 0.75mm/px · 2 of 25 slices shown (1 of 3)]
[im 1/25]
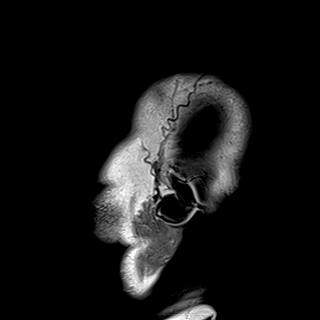
[im 25/25]
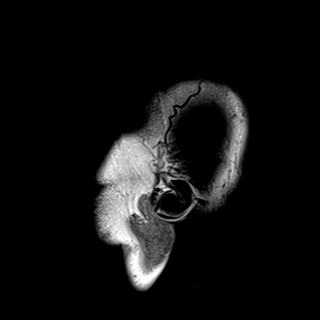

[Series 8: T2 · axial · 4.0mm · 0.75mm/px · z∈[-33,+121]mm · 2 of 32 slices shown]
[im 1/32]
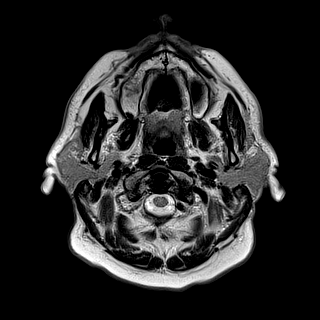
[im 32/32]
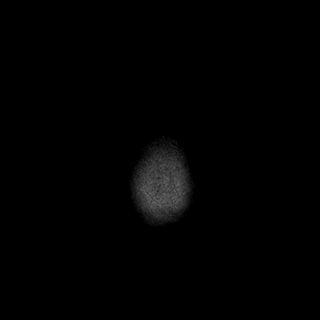

[Series 9: mag_images · axial · 3.0mm · 0.94mm/px · z∈[-35,+118]mm · 3 of 52 slices shown]
[im 1/52]
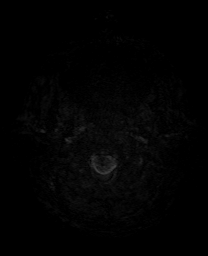
[im 26/52]
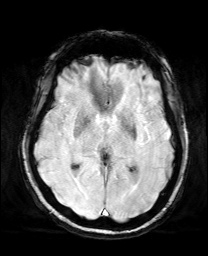
[im 52/52]
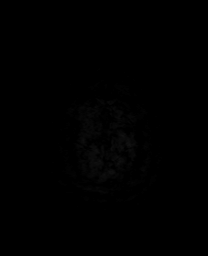

[Series 10: pha_images · axial · 3.0mm · 0.94mm/px · z∈[-35,+118]mm · 3 of 52 slices shown]
[im 1/52]
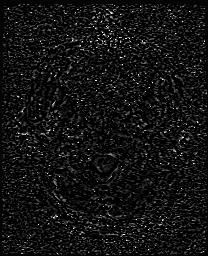
[im 26/52]
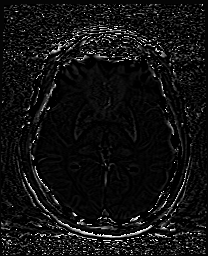
[im 52/52]
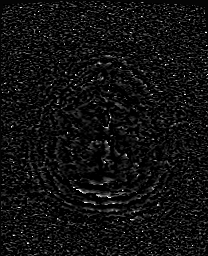

[Series 11: swi_images · axial · 3.0mm · 0.94mm/px · z∈[-35,+118]mm · 3 of 52 slices shown]
[im 1/52]
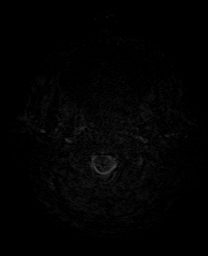
[im 26/52]
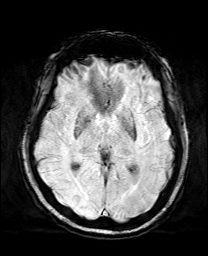
[im 52/52]
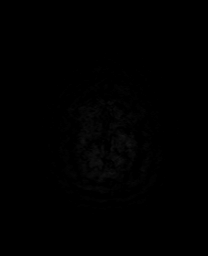

[Series 12: mip_images(sw) · axial · 24.0mm · 0.94mm/px · z∈[-24,+107]mm · 3 of 45 slices shown]
[im 1/45]
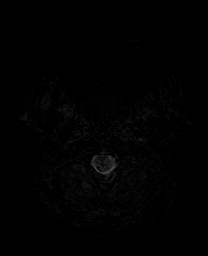
[im 23/45]
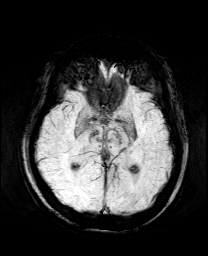
[im 45/45]
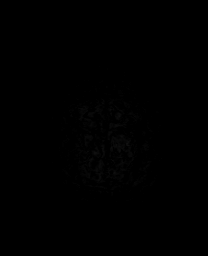

[Series 13: FLAIR · axial · 3.0mm · 0.47mm/px · z∈[-40,+124]mm · 3 of 45 slices shown]
[im 1/45]
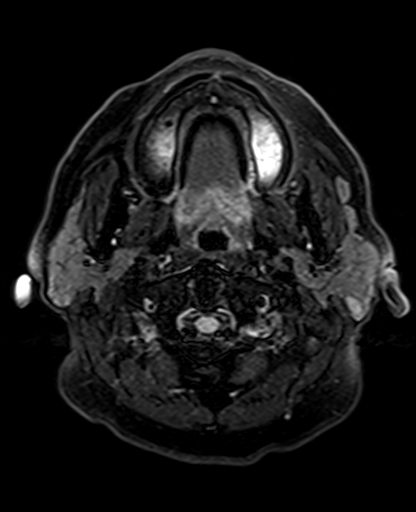
[im 23/45]
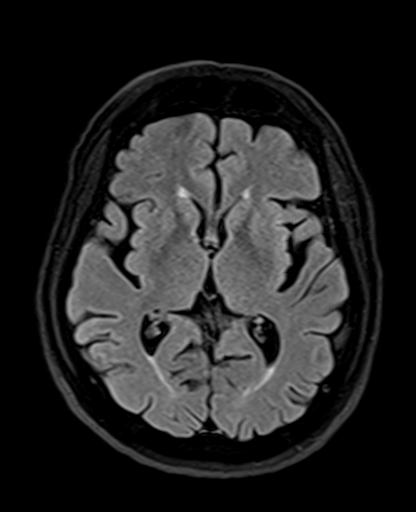
[im 45/45]
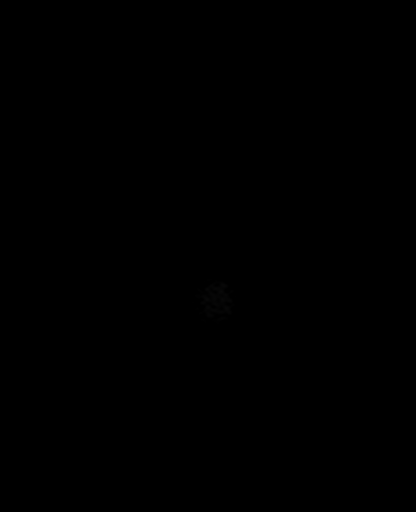

[Series 15: T1 · sagittal · 3.0mm · 0.25mm/px · 1 of 15 slices shown (2 of 3)]
[im 1/15]
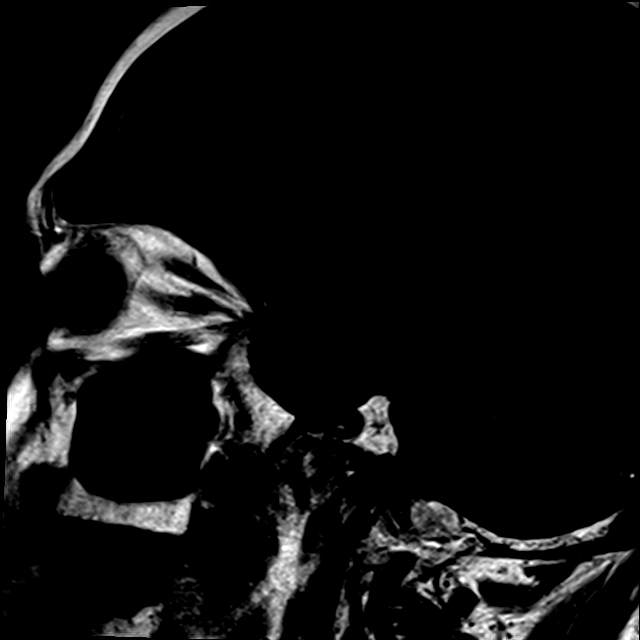

[Series 16: T1 · coronal · 3.0mm · 0.31mm/px · 1 of 17 slices shown (3 of 3)]
[im 1/17]
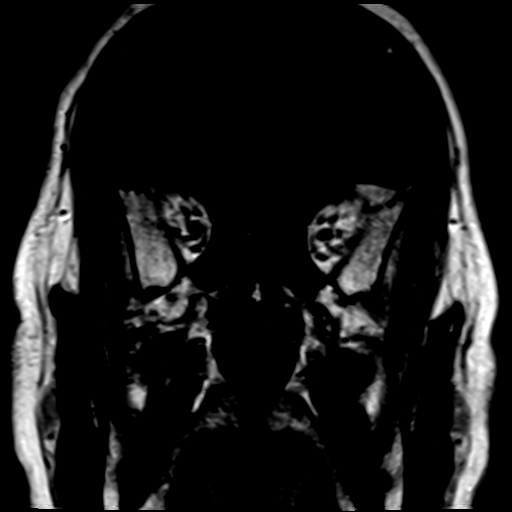

[Series 17: t1_tse_cor_dynamic pre · coronal · non-contrast · 3.0mm · 0.49mm/px · 1 of 7 slices shown]
[im 1/7]
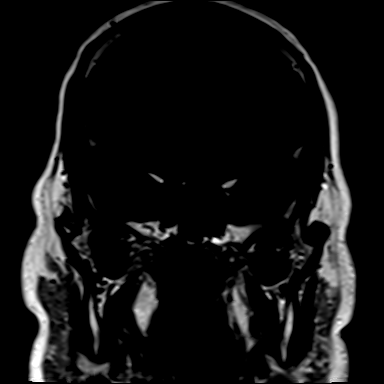

[Series 18: t1_tse_cor_dynamic post · coronal · 3.0mm · 0.49mm/px · 1 of 7 slices shown (1 of 2)]
[im 1/7]
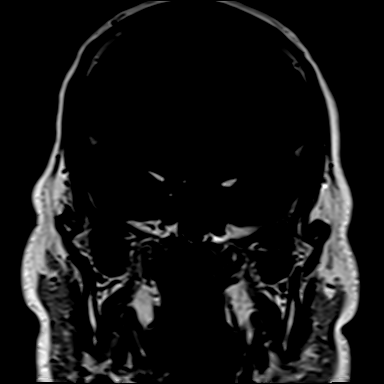

[Series 19: t1_tse_cor_dynamic post · coronal · 3.0mm · 0.49mm/px · 1 of 7 slices shown (2 of 2)]
[im 1/7]
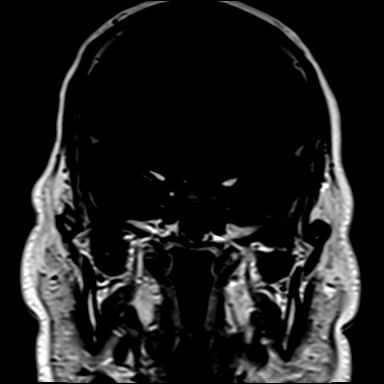

[Series 24: T2 post-contrast · coronal · 5.0mm · 0.72mm/px · 2 of 28 slices shown]
[im 1/28]
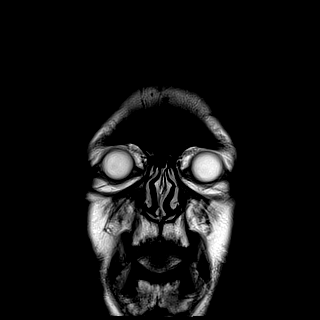
[im 28/28]
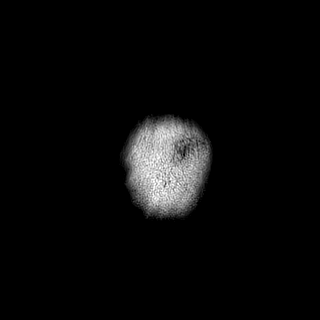

[Series 25: T1 post-contrast · sagittal · 3.0mm · 0.25mm/px · 1 of 15 slices shown (1 of 3)]
[im 1/15]
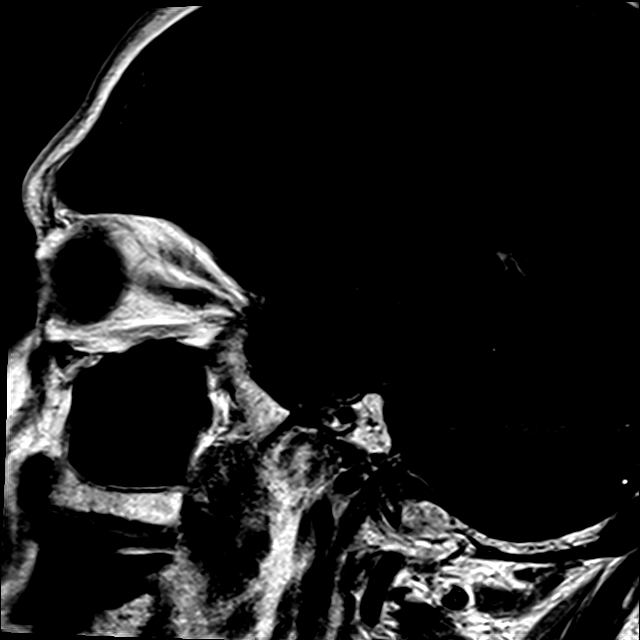

[Series 26: T1 post-contrast · coronal · 3.0mm · 0.31mm/px · 1 of 17 slices shown (2 of 3)]
[im 1/17]
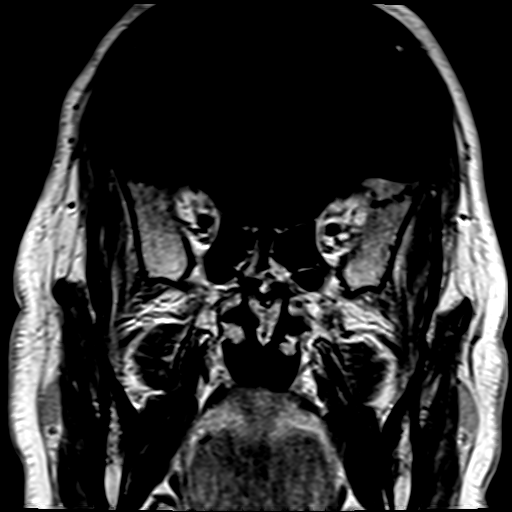

[Series 28: T1 post-contrast · coronal · 5.0mm · 0.34mm/px · 2 of 28 slices shown (3 of 3)]
[im 1/28]
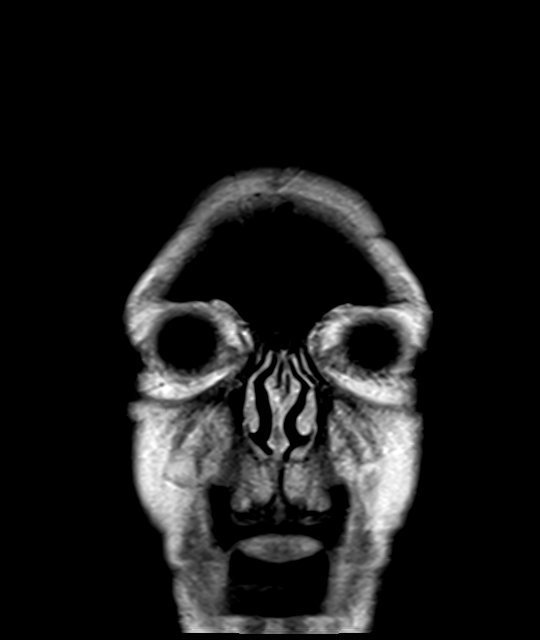
[im 28/28]
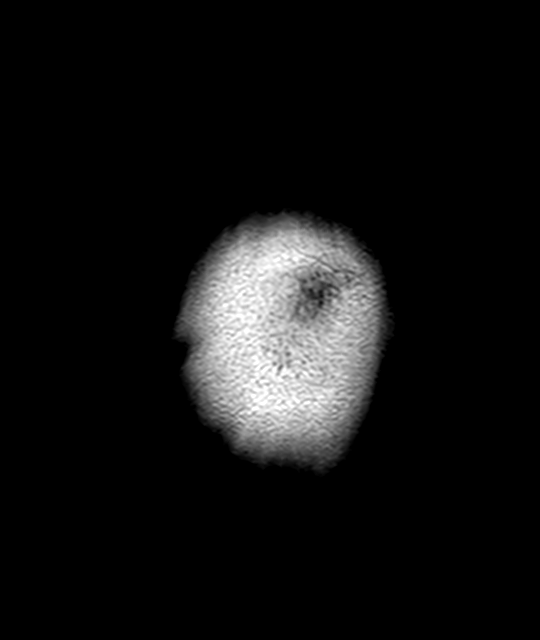

[38 of 48 positions shown; findings below may reference images not displayed]

FINDINGS: Brain: Snowman shape sellar and suprasellar mass with
hypoenhancement compared to normal pituitary. Maximal dimensions are
22 x 20 x 18 mm. Intrinsic T1 hyperintense patchy signal at the
level of the sellar component. No cavernous sinus extension. There
is broad contact and upper displacement of the optic chiasm.

Mild cerebral white matter disease attributed to microvascular
ischemia. No acute infarct, hydrocephalus, collection, or atrophy.

Vascular: Major flow voids are preserved

Skull and upper cervical spine: Negative for marrow lesion

Sinuses/Orbits: Bilateral cataract resection. No significant
sinusitis.
IMPRESSION: 22 x 20 x 18 mm sellar and suprasellar mass consistent with macro
adenoma. T1 hyperintensity in the lower mass from proteinaceous cyst
or hemorrhage, please correlate for apoplexy symptoms. There is
upward mass effect on the chiasm.

## 2021-03-09 ENCOUNTER — Other Ambulatory Visit (HOSPITAL_COMMUNITY): Payer: Self-pay

## 2021-03-09 MED ORDER — ATORVASTATIN CALCIUM 20 MG PO TABS
20.0000 mg | ORAL_TABLET | Freq: Every day | ORAL | 3 refills | Status: DC
  Filled 2021-03-09: qty 90, 90d supply, fill #0
  Filled 2021-06-08: qty 90, 90d supply, fill #1
  Filled 2021-08-29: qty 90, 90d supply, fill #2
  Filled 2021-11-11: qty 90, 90d supply, fill #3

## 2021-03-10 ENCOUNTER — Other Ambulatory Visit (HOSPITAL_COMMUNITY): Payer: Self-pay

## 2021-03-10 MED ORDER — DICLOFENAC SODIUM 1 % EX GEL
1.0000 "application " | Freq: Four times a day (QID) | CUTANEOUS | 11 refills | Status: AC | PRN
  Filled 2021-03-10: qty 300, 75d supply, fill #0

## 2021-04-05 ENCOUNTER — Other Ambulatory Visit (HOSPITAL_COMMUNITY): Payer: Self-pay

## 2021-04-19 ENCOUNTER — Other Ambulatory Visit (HOSPITAL_COMMUNITY): Payer: Self-pay

## 2021-04-19 MED ORDER — METHADONE HCL 10 MG PO TABS
10.0000 mg | ORAL_TABLET | Freq: Three times a day (TID) | ORAL | 0 refills | Status: AC
  Filled 2021-06-08: qty 90, 30d supply, fill #0

## 2021-04-19 MED ORDER — METHADONE HCL 10 MG PO TABS
10.0000 mg | ORAL_TABLET | Freq: Three times a day (TID) | ORAL | 0 refills | Status: AC
  Filled 2021-05-07: qty 90, 30d supply, fill #0

## 2021-04-19 MED ORDER — METHADONE HCL 10 MG PO TABS
10.0000 mg | ORAL_TABLET | Freq: Three times a day (TID) | ORAL | 0 refills | Status: AC
  Filled 2021-07-12: qty 90, 30d supply, fill #0

## 2021-04-28 ENCOUNTER — Other Ambulatory Visit (HOSPITAL_COMMUNITY): Payer: Self-pay

## 2021-04-28 MED FILL — Levothyroxine Sodium Tab 125 MCG: ORAL | 90 days supply | Qty: 90 | Fill #1 | Status: AC

## 2021-05-06 MED FILL — Amlodipine Besylate-Benazepril HCl Cap 5-10 MG: ORAL | 90 days supply | Qty: 90 | Fill #1 | Status: AC

## 2021-05-07 ENCOUNTER — Other Ambulatory Visit (HOSPITAL_COMMUNITY): Payer: Self-pay

## 2021-05-20 ENCOUNTER — Other Ambulatory Visit (HOSPITAL_COMMUNITY): Payer: Self-pay

## 2021-05-20 MED ORDER — QUICKVUE AT-HOME COVID-19 TEST VI KIT
PACK | 0 refills | Status: AC
  Filled 2021-05-20: qty 2, 2d supply, fill #0

## 2021-06-07 ENCOUNTER — Other Ambulatory Visit (HOSPITAL_COMMUNITY): Payer: Self-pay

## 2021-06-07 MED ORDER — FLUAD QUADRIVALENT 0.5 ML IM PRSY
0.5000 mL | PREFILLED_SYRINGE | Freq: Once | INTRAMUSCULAR | 0 refills | Status: AC
  Filled 2021-06-07: qty 0.5, 1d supply, fill #0

## 2021-06-07 MED ORDER — PREVNAR 20 0.5 ML IM SUSY
0.5000 mL | PREFILLED_SYRINGE | Freq: Once | INTRAMUSCULAR | 0 refills | Status: AC
  Filled 2021-06-07: qty 0.5, 1d supply, fill #0

## 2021-06-08 ENCOUNTER — Other Ambulatory Visit (HOSPITAL_COMMUNITY): Payer: Self-pay

## 2021-06-09 ENCOUNTER — Other Ambulatory Visit (HOSPITAL_COMMUNITY): Payer: Self-pay

## 2021-07-12 ENCOUNTER — Other Ambulatory Visit (HOSPITAL_COMMUNITY): Payer: Self-pay

## 2021-07-19 ENCOUNTER — Other Ambulatory Visit: Payer: Self-pay

## 2021-07-19 ENCOUNTER — Other Ambulatory Visit: Payer: Self-pay | Admitting: Endocrinology

## 2021-07-19 ENCOUNTER — Other Ambulatory Visit (HOSPITAL_COMMUNITY): Payer: Self-pay

## 2021-07-19 MED ORDER — LEVOTHYROXINE SODIUM 112 MCG PO TABS
112.0000 ug | ORAL_TABLET | Freq: Every day | ORAL | 4 refills | Status: AC
  Filled 2021-07-19: qty 90, 90d supply, fill #0

## 2021-07-20 ENCOUNTER — Other Ambulatory Visit (HOSPITAL_COMMUNITY): Payer: Self-pay

## 2021-07-20 MED ORDER — METHADONE HCL 10 MG PO TABS
10.0000 mg | ORAL_TABLET | Freq: Three times a day (TID) | ORAL | 0 refills | Status: AC
Start: 2021-08-11 — End: ?
  Filled 2021-08-13: qty 90, 30d supply, fill #0

## 2021-07-20 MED ORDER — METHADONE HCL 10 MG PO TABS
10.0000 mg | ORAL_TABLET | Freq: Three times a day (TID) | ORAL | 0 refills | Status: DC
  Filled 2021-10-18: qty 90, 30d supply, fill #0

## 2021-07-20 MED ORDER — METHADONE HCL 10 MG PO TABS
10.0000 mg | ORAL_TABLET | Freq: Three times a day (TID) | ORAL | 0 refills | Status: AC
  Filled 2021-09-15: qty 90, 30d supply, fill #0

## 2021-07-21 ENCOUNTER — Other Ambulatory Visit (HOSPITAL_COMMUNITY): Payer: Self-pay

## 2021-07-22 ENCOUNTER — Other Ambulatory Visit (HOSPITAL_COMMUNITY): Payer: Self-pay

## 2021-07-22 MED ORDER — LEVOTHYROXINE SODIUM 125 MCG PO TABS
125.0000 ug | ORAL_TABLET | Freq: Every morning | ORAL | 3 refills | Status: DC
  Filled 2021-07-22: qty 90, 90d supply, fill #0
  Filled 2021-10-24: qty 90, 90d supply, fill #1
  Filled 2022-01-15: qty 90, 90d supply, fill #2
  Filled 2022-04-17: qty 90, 90d supply, fill #3

## 2021-08-03 ENCOUNTER — Other Ambulatory Visit (HOSPITAL_COMMUNITY): Payer: Self-pay

## 2021-08-03 MED ORDER — QVAR REDIHALER 80 MCG/ACT IN AERB
2.0000 | INHALATION_SPRAY | Freq: Every day | RESPIRATORY_TRACT | 3 refills | Status: AC
  Filled 2021-08-03 – 2021-08-13 (×3): qty 10.6, 30d supply, fill #0
  Filled 2021-12-23: qty 10.6, 60d supply, fill #0

## 2021-08-03 MED ORDER — DULOXETINE HCL 20 MG PO CPEP
20.0000 mg | ORAL_CAPSULE | Freq: Every day | ORAL | 1 refills | Status: DC
  Filled 2021-08-03: qty 30, 30d supply, fill #0
  Filled 2021-10-05: qty 30, 30d supply, fill #1

## 2021-08-03 MED ORDER — TEMAZEPAM 15 MG PO CAPS
15.0000 mg | ORAL_CAPSULE | Freq: Every evening | ORAL | 0 refills | Status: AC | PRN
  Filled 2021-08-03: qty 30, 30d supply, fill #0

## 2021-08-09 ENCOUNTER — Other Ambulatory Visit (HOSPITAL_COMMUNITY): Payer: Self-pay

## 2021-08-13 ENCOUNTER — Other Ambulatory Visit (HOSPITAL_COMMUNITY): Payer: Self-pay

## 2021-08-13 MED ORDER — AMLODIPINE BESY-BENAZEPRIL HCL 5-10 MG PO CAPS
1.0000 | ORAL_CAPSULE | Freq: Every day | ORAL | 3 refills | Status: DC
  Filled 2021-08-13: qty 90, 90d supply, fill #0
  Filled 2021-11-11: qty 90, 90d supply, fill #1
  Filled 2022-02-03: qty 90, 90d supply, fill #2
  Filled 2022-05-13: qty 90, 90d supply, fill #3

## 2021-08-17 ENCOUNTER — Other Ambulatory Visit (HOSPITAL_COMMUNITY): Payer: Self-pay

## 2021-08-17 MED ORDER — FLUTICASONE PROPIONATE HFA 110 MCG/ACT IN AERO
2.0000 | INHALATION_SPRAY | Freq: Two times a day (BID) | RESPIRATORY_TRACT | 1 refills | Status: AC
  Filled 2021-08-17: qty 12, 30d supply, fill #0

## 2021-08-30 ENCOUNTER — Other Ambulatory Visit (HOSPITAL_COMMUNITY): Payer: Self-pay

## 2021-09-15 ENCOUNTER — Other Ambulatory Visit (HOSPITAL_COMMUNITY): Payer: Self-pay

## 2021-10-05 ENCOUNTER — Other Ambulatory Visit (HOSPITAL_COMMUNITY): Payer: Self-pay

## 2021-10-11 ENCOUNTER — Other Ambulatory Visit (HOSPITAL_COMMUNITY): Payer: Self-pay

## 2021-10-11 MED ORDER — CARESTART COVID-19 HOME TEST VI KIT
PACK | 0 refills | Status: AC
  Filled 2021-10-11: qty 4, 4d supply, fill #0

## 2021-10-18 ENCOUNTER — Other Ambulatory Visit (HOSPITAL_COMMUNITY): Payer: Self-pay

## 2021-10-25 ENCOUNTER — Other Ambulatory Visit (HOSPITAL_COMMUNITY): Payer: Self-pay

## 2021-11-11 ENCOUNTER — Other Ambulatory Visit (HOSPITAL_COMMUNITY): Payer: Self-pay

## 2021-11-18 ENCOUNTER — Other Ambulatory Visit (HOSPITAL_COMMUNITY): Payer: Self-pay

## 2021-11-18 MED ORDER — METHADONE HCL 10 MG PO TABS
10.0000 mg | ORAL_TABLET | Freq: Three times a day (TID) | ORAL | 0 refills | Status: AC
Start: 2021-11-18 — End: ?
  Filled 2021-11-18: qty 10, 4d supply, fill #0
  Filled 2021-11-18: qty 80, 26d supply, fill #0

## 2021-11-18 MED ORDER — METHADONE HCL 10 MG PO TABS
10.0000 mg | ORAL_TABLET | Freq: Three times a day (TID) | ORAL | 0 refills | Status: AC
  Filled 2022-01-18: qty 90, 30d supply, fill #0

## 2021-11-18 MED ORDER — METHADONE HCL 10 MG PO TABS
10.0000 mg | ORAL_TABLET | Freq: Three times a day (TID) | ORAL | 0 refills | Status: AC
Start: 2021-12-18 — End: ?
  Filled 2021-12-20: qty 60, 20d supply, fill #0
  Filled 2021-12-20: qty 30, 10d supply, fill #0

## 2021-11-18 MED ORDER — DULOXETINE HCL 20 MG PO CPEP
20.0000 mg | ORAL_CAPSULE | Freq: Every day | ORAL | 2 refills | Status: AC
Start: 2021-11-18 — End: ?
  Filled 2021-11-18: qty 30, 30d supply, fill #0

## 2021-12-14 ENCOUNTER — Other Ambulatory Visit (HOSPITAL_COMMUNITY): Payer: Self-pay

## 2021-12-14 MED ORDER — EPINEPHRINE 0.3 MG/0.3ML IJ SOAJ
INTRAMUSCULAR | 1 refills | Status: AC
  Filled 2021-12-14: qty 2, 2d supply, fill #0

## 2021-12-20 ENCOUNTER — Other Ambulatory Visit (HOSPITAL_COMMUNITY): Payer: Self-pay

## 2021-12-23 ENCOUNTER — Other Ambulatory Visit (HOSPITAL_COMMUNITY): Payer: Self-pay

## 2021-12-23 MED ORDER — ALBUTEROL SULFATE HFA 108 (90 BASE) MCG/ACT IN AERS
2.0000 | INHALATION_SPRAY | RESPIRATORY_TRACT | 3 refills | Status: AC
  Filled 2021-12-23: qty 18, 17d supply, fill #0
  Filled 2022-02-17: qty 18, 17d supply, fill #1

## 2021-12-23 MED ORDER — FLUTICASONE PROPIONATE HFA 110 MCG/ACT IN AERO
2.0000 | INHALATION_SPRAY | Freq: Two times a day (BID) | RESPIRATORY_TRACT | 1 refills | Status: AC | PRN
  Filled 2021-12-23: qty 12, 30d supply, fill #0
  Filled 2022-02-17: qty 12, 30d supply, fill #1

## 2021-12-30 ENCOUNTER — Other Ambulatory Visit (HOSPITAL_COMMUNITY): Payer: Self-pay

## 2022-01-17 ENCOUNTER — Other Ambulatory Visit (HOSPITAL_COMMUNITY): Payer: Self-pay

## 2022-01-18 ENCOUNTER — Other Ambulatory Visit (HOSPITAL_COMMUNITY): Payer: Self-pay

## 2022-02-04 ENCOUNTER — Other Ambulatory Visit (HOSPITAL_COMMUNITY): Payer: Self-pay

## 2022-02-16 ENCOUNTER — Other Ambulatory Visit (HOSPITAL_COMMUNITY): Payer: Self-pay

## 2022-02-16 MED ORDER — METHADONE HCL 10 MG PO TABS
10.0000 mg | ORAL_TABLET | Freq: Three times a day (TID) | ORAL | 0 refills | Status: AC
  Filled 2022-04-27: qty 90, 30d supply, fill #0

## 2022-02-16 MED ORDER — METHADONE HCL 10 MG PO TABS
10.0000 mg | ORAL_TABLET | Freq: Three times a day (TID) | ORAL | 0 refills | Status: AC
  Filled 2022-02-17: qty 90, 30d supply, fill #0

## 2022-02-16 MED ORDER — METHADONE HCL 10 MG PO TABS
10.0000 mg | ORAL_TABLET | Freq: Three times a day (TID) | ORAL | 0 refills | Status: AC
  Filled 2022-03-23: qty 90, 30d supply, fill #0

## 2022-02-16 MED ORDER — METHOCARBAMOL 750 MG PO TABS
750.0000 mg | ORAL_TABLET | Freq: Three times a day (TID) | ORAL | 2 refills | Status: AC
  Filled 2022-02-16: qty 90, 30d supply, fill #0

## 2022-02-16 MED ORDER — DULOXETINE HCL 20 MG PO CPEP
20.0000 mg | ORAL_CAPSULE | Freq: Every day | ORAL | 2 refills | Status: AC
  Filled 2022-02-16: qty 30, 30d supply, fill #0

## 2022-02-17 ENCOUNTER — Other Ambulatory Visit (HOSPITAL_COMMUNITY): Payer: Self-pay

## 2022-02-18 ENCOUNTER — Other Ambulatory Visit: Payer: Self-pay | Admitting: Neurosurgery

## 2022-02-18 DIAGNOSIS — M961 Postlaminectomy syndrome, not elsewhere classified: Secondary | ICD-10-CM

## 2022-02-26 ENCOUNTER — Other Ambulatory Visit (HOSPITAL_COMMUNITY): Payer: Self-pay

## 2022-02-28 ENCOUNTER — Other Ambulatory Visit (HOSPITAL_COMMUNITY): Payer: Self-pay

## 2022-02-28 MED ORDER — ATORVASTATIN CALCIUM 20 MG PO TABS
20.0000 mg | ORAL_TABLET | Freq: Every day | ORAL | 3 refills | Status: DC
  Filled 2022-02-28: qty 90, 90d supply, fill #0
  Filled 2022-05-18: qty 90, 90d supply, fill #1
  Filled 2022-08-21: qty 90, 90d supply, fill #2
  Filled 2022-11-20: qty 90, 90d supply, fill #3

## 2022-03-02 ENCOUNTER — Other Ambulatory Visit (HOSPITAL_COMMUNITY): Payer: Self-pay

## 2022-03-02 MED ORDER — ALBUTEROL SULFATE HFA 108 (90 BASE) MCG/ACT IN AERS
2.0000 | INHALATION_SPRAY | RESPIRATORY_TRACT | 3 refills | Status: AC | PRN
  Filled 2022-03-02: qty 18, 17d supply, fill #0
  Filled 2022-09-18: qty 6.7, 25d supply, fill #1
  Filled 2022-12-05: qty 20.1, 75d supply, fill #2

## 2022-03-02 MED ORDER — TEMAZEPAM 15 MG PO CAPS
15.0000 mg | ORAL_CAPSULE | Freq: Every evening | ORAL | 5 refills | Status: AC | PRN
  Filled 2022-03-02: qty 30, 30d supply, fill #0
  Filled 2022-04-08: qty 30, 30d supply, fill #1
  Filled 2022-05-19: qty 30, 30d supply, fill #2
  Filled 2022-06-26: qty 30, 30d supply, fill #3
  Filled 2022-07-28: qty 30, 30d supply, fill #4
  Filled 2022-08-27: qty 30, 30d supply, fill #5

## 2022-03-02 MED ORDER — FLUTICASONE PROPIONATE HFA 110 MCG/ACT IN AERO
2.0000 | INHALATION_SPRAY | Freq: Two times a day (BID) | RESPIRATORY_TRACT | 3 refills | Status: AC
  Filled 2022-03-02: qty 36, 84d supply, fill #0

## 2022-03-02 MED ORDER — ATORVASTATIN CALCIUM 20 MG PO TABS
20.0000 mg | ORAL_TABLET | Freq: Every day | ORAL | 3 refills | Status: AC
Start: 2022-03-02 — End: ?
  Filled 2022-03-02 – 2023-01-05 (×2): qty 90, 90d supply, fill #0

## 2022-03-03 ENCOUNTER — Other Ambulatory Visit: Payer: No Typology Code available for payment source

## 2022-03-23 ENCOUNTER — Other Ambulatory Visit (HOSPITAL_COMMUNITY): Payer: Self-pay

## 2022-03-24 ENCOUNTER — Other Ambulatory Visit (HOSPITAL_COMMUNITY): Payer: Self-pay

## 2022-03-29 ENCOUNTER — Other Ambulatory Visit (HOSPITAL_COMMUNITY): Payer: Self-pay

## 2022-03-31 ENCOUNTER — Other Ambulatory Visit (HOSPITAL_COMMUNITY): Payer: Self-pay

## 2022-03-31 MED ORDER — DICLOFENAC SODIUM 1 % EX GEL
4.0000 g | Freq: Four times a day (QID) | CUTANEOUS | 11 refills | Status: AC
  Filled 2022-03-31 (×2): qty 500, 30d supply, fill #0
  Filled 2022-05-25: qty 400, 25d supply, fill #0
  Filled 2022-09-20: qty 400, 25d supply, fill #1

## 2022-04-01 ENCOUNTER — Other Ambulatory Visit (HOSPITAL_COMMUNITY): Payer: Self-pay

## 2022-04-08 ENCOUNTER — Other Ambulatory Visit (HOSPITAL_COMMUNITY): Payer: Self-pay

## 2022-04-18 ENCOUNTER — Other Ambulatory Visit (HOSPITAL_COMMUNITY): Payer: Self-pay

## 2022-04-27 ENCOUNTER — Other Ambulatory Visit (HOSPITAL_COMMUNITY): Payer: Self-pay

## 2022-05-13 ENCOUNTER — Other Ambulatory Visit (HOSPITAL_COMMUNITY): Payer: Self-pay

## 2022-05-17 ENCOUNTER — Other Ambulatory Visit (HOSPITAL_COMMUNITY): Payer: Self-pay

## 2022-05-17 MED ORDER — METHOCARBAMOL 750 MG PO TABS
750.0000 mg | ORAL_TABLET | Freq: Three times a day (TID) | ORAL | 2 refills | Status: AC
  Filled 2022-05-17: qty 61, 21d supply, fill #0
  Filled 2022-05-17: qty 90, 30d supply, fill #0
  Filled 2022-05-17: qty 29, 9d supply, fill #0

## 2022-05-17 MED ORDER — DULOXETINE HCL 20 MG PO CPEP
20.0000 mg | ORAL_CAPSULE | Freq: Every day | ORAL | 2 refills | Status: DC
  Filled 2022-05-17: qty 30, 30d supply, fill #0
  Filled 2022-07-28: qty 30, 30d supply, fill #1
  Filled 2023-01-31: qty 30, 30d supply, fill #2

## 2022-05-17 MED ORDER — METHADONE HCL 10 MG PO TABS
10.0000 mg | ORAL_TABLET | Freq: Three times a day (TID) | ORAL | 0 refills | Status: AC
  Filled 2022-05-30: qty 90, 30d supply, fill #0

## 2022-05-19 ENCOUNTER — Other Ambulatory Visit (HOSPITAL_COMMUNITY): Payer: Self-pay

## 2022-05-20 ENCOUNTER — Other Ambulatory Visit (HOSPITAL_COMMUNITY): Payer: Self-pay

## 2022-05-25 ENCOUNTER — Other Ambulatory Visit (HOSPITAL_COMMUNITY): Payer: Self-pay

## 2022-05-26 ENCOUNTER — Other Ambulatory Visit (HOSPITAL_COMMUNITY): Payer: Self-pay

## 2022-05-26 MED ORDER — FLUAD QUADRIVALENT 0.5 ML IM PRSY
0.5000 mL | PREFILLED_SYRINGE | INTRAMUSCULAR | 0 refills | Status: AC
  Filled 2022-05-26: qty 0.5, 1d supply, fill #0

## 2022-05-26 MED ORDER — METHADONE HCL 10 MG PO TABS
10.0000 mg | ORAL_TABLET | Freq: Three times a day (TID) | ORAL | 0 refills | Status: AC | PRN
  Filled 2022-06-29: qty 90, 30d supply, fill #0

## 2022-05-26 MED ORDER — METHADONE HCL 10 MG PO TABS
10.0000 mg | ORAL_TABLET | Freq: Three times a day (TID) | ORAL | 0 refills | Status: AC | PRN
  Filled 2022-08-01: qty 90, 30d supply, fill #0

## 2022-05-30 ENCOUNTER — Other Ambulatory Visit (HOSPITAL_COMMUNITY): Payer: Self-pay

## 2022-06-27 ENCOUNTER — Other Ambulatory Visit (HOSPITAL_COMMUNITY): Payer: Self-pay

## 2022-06-29 ENCOUNTER — Other Ambulatory Visit (HOSPITAL_COMMUNITY): Payer: Self-pay

## 2022-07-12 ENCOUNTER — Other Ambulatory Visit (HOSPITAL_COMMUNITY): Payer: Self-pay

## 2022-07-19 ENCOUNTER — Other Ambulatory Visit (HOSPITAL_COMMUNITY): Payer: Self-pay

## 2022-07-19 MED ORDER — LEVOTHYROXINE SODIUM 125 MCG PO TABS
125.0000 ug | ORAL_TABLET | Freq: Every morning | ORAL | 3 refills | Status: DC
  Filled 2022-07-19: qty 90, 90d supply, fill #0
  Filled 2022-10-10: qty 90, 90d supply, fill #1
  Filled 2023-01-05 – 2023-01-10 (×2): qty 90, 90d supply, fill #2
  Filled 2023-04-07: qty 90, 90d supply, fill #3

## 2022-07-27 ENCOUNTER — Other Ambulatory Visit (HOSPITAL_COMMUNITY): Payer: Self-pay

## 2022-07-29 ENCOUNTER — Other Ambulatory Visit (HOSPITAL_COMMUNITY): Payer: Self-pay

## 2022-08-01 ENCOUNTER — Other Ambulatory Visit (HOSPITAL_COMMUNITY): Payer: Self-pay

## 2022-08-02 ENCOUNTER — Other Ambulatory Visit (HOSPITAL_COMMUNITY): Payer: Self-pay

## 2022-08-02 MED ORDER — AMLODIPINE BESY-BENAZEPRIL HCL 5-10 MG PO CAPS
1.0000 | ORAL_CAPSULE | Freq: Every day | ORAL | 1 refills | Status: DC
  Filled 2022-08-02: qty 90, 90d supply, fill #0
  Filled 2023-01-05 – 2023-03-28 (×3): qty 90, 90d supply, fill #1

## 2022-08-18 ENCOUNTER — Other Ambulatory Visit (HOSPITAL_COMMUNITY): Payer: Self-pay

## 2022-08-18 MED ORDER — DULOXETINE HCL 20 MG PO CPEP
20.0000 mg | ORAL_CAPSULE | Freq: Every day | ORAL | 11 refills | Status: DC
  Filled 2022-08-18: qty 30, 30d supply, fill #0

## 2022-08-18 MED ORDER — METHADONE HCL 10 MG PO TABS
10.0000 mg | ORAL_TABLET | Freq: Three times a day (TID) | ORAL | 0 refills | Status: AC
  Filled 2022-09-02: qty 90, 30d supply, fill #0

## 2022-08-18 MED ORDER — METHOCARBAMOL 750 MG PO TABS
750.0000 mg | ORAL_TABLET | Freq: Three times a day (TID) | ORAL | 11 refills | Status: DC
  Filled 2022-08-18: qty 90, 30d supply, fill #0
  Filled 2022-09-20: qty 90, 30d supply, fill #1
  Filled 2023-03-27 – 2023-03-28 (×2): qty 90, 30d supply, fill #2

## 2022-08-18 MED ORDER — METHADONE HCL 10 MG PO TABS
10.0000 mg | ORAL_TABLET | Freq: Three times a day (TID) | ORAL | 0 refills | Status: AC
  Filled 2022-11-07: qty 90, 30d supply, fill #0

## 2022-08-18 MED ORDER — METHADONE HCL 10 MG PO TABS
10.0000 mg | ORAL_TABLET | Freq: Three times a day (TID) | ORAL | 0 refills | Status: AC
  Filled 2022-10-05: qty 90, 30d supply, fill #0

## 2022-08-29 ENCOUNTER — Other Ambulatory Visit: Payer: Self-pay

## 2022-09-02 ENCOUNTER — Other Ambulatory Visit (HOSPITAL_COMMUNITY): Payer: Self-pay

## 2022-09-11 ENCOUNTER — Other Ambulatory Visit (HOSPITAL_COMMUNITY): Payer: Self-pay

## 2022-09-14 ENCOUNTER — Other Ambulatory Visit (HOSPITAL_COMMUNITY): Payer: Self-pay

## 2022-09-15 ENCOUNTER — Other Ambulatory Visit (HOSPITAL_COMMUNITY): Payer: Self-pay

## 2022-09-16 ENCOUNTER — Other Ambulatory Visit (HOSPITAL_COMMUNITY): Payer: Self-pay

## 2022-09-16 MED ORDER — FLUTICASONE PROPIONATE HFA 110 MCG/ACT IN AERO
2.0000 | INHALATION_SPRAY | Freq: Two times a day (BID) | RESPIRATORY_TRACT | 3 refills | Status: AC
  Filled 2022-09-16: qty 36, 90d supply, fill #0
  Filled 2022-12-05 – 2022-12-07 (×2): qty 36, 90d supply, fill #1
  Filled 2023-08-09: qty 36, 90d supply, fill #0

## 2022-09-19 ENCOUNTER — Other Ambulatory Visit (HOSPITAL_COMMUNITY): Payer: Self-pay

## 2022-09-19 ENCOUNTER — Other Ambulatory Visit: Payer: Self-pay

## 2022-09-19 DIAGNOSIS — F5104 Psychophysiologic insomnia: Secondary | ICD-10-CM | POA: Diagnosis not present

## 2022-09-19 DIAGNOSIS — E1169 Type 2 diabetes mellitus with other specified complication: Secondary | ICD-10-CM | POA: Diagnosis not present

## 2022-09-19 DIAGNOSIS — Z8546 Personal history of malignant neoplasm of prostate: Secondary | ICD-10-CM | POA: Diagnosis not present

## 2022-09-19 DIAGNOSIS — D352 Benign neoplasm of pituitary gland: Secondary | ICD-10-CM | POA: Diagnosis not present

## 2022-09-19 DIAGNOSIS — I10 Essential (primary) hypertension: Secondary | ICD-10-CM | POA: Diagnosis not present

## 2022-09-19 DIAGNOSIS — N529 Male erectile dysfunction, unspecified: Secondary | ICD-10-CM | POA: Diagnosis not present

## 2022-09-19 DIAGNOSIS — E7800 Pure hypercholesterolemia, unspecified: Secondary | ICD-10-CM | POA: Diagnosis not present

## 2022-09-19 DIAGNOSIS — J45909 Unspecified asthma, uncomplicated: Secondary | ICD-10-CM | POA: Diagnosis not present

## 2022-09-19 DIAGNOSIS — E039 Hypothyroidism, unspecified: Secondary | ICD-10-CM | POA: Diagnosis not present

## 2022-09-19 MED ORDER — ALBUTEROL SULFATE HFA 108 (90 BASE) MCG/ACT IN AERS
2.0000 | INHALATION_SPRAY | RESPIRATORY_TRACT | 3 refills | Status: AC | PRN
  Filled 2022-09-19: qty 6.7, 17d supply, fill #0

## 2022-09-20 ENCOUNTER — Other Ambulatory Visit (HOSPITAL_COMMUNITY): Payer: Self-pay

## 2022-09-20 MED ORDER — FLUTICASONE PROPIONATE HFA 110 MCG/ACT IN AERO
2.0000 | INHALATION_SPRAY | Freq: Two times a day (BID) | RESPIRATORY_TRACT | 3 refills | Status: AC
  Filled 2022-09-20: qty 12, 30d supply, fill #0

## 2022-09-20 MED ORDER — AMLODIPINE BESY-BENAZEPRIL HCL 5-20 MG PO CAPS
1.0000 | ORAL_CAPSULE | Freq: Every day | ORAL | 1 refills | Status: DC
  Filled 2022-09-20: qty 90, 90d supply, fill #0
  Filled 2022-12-06: qty 90, 90d supply, fill #1

## 2022-09-20 MED ORDER — OZEMPIC (0.25 OR 0.5 MG/DOSE) 2 MG/3ML ~~LOC~~ SOPN
0.2500 mg | PEN_INJECTOR | SUBCUTANEOUS | 0 refills | Status: AC
  Filled 2022-09-20: qty 3, 56d supply, fill #0
  Filled 2022-09-21: qty 3, 42d supply, fill #0
  Filled 2022-09-22: qty 3, 28d supply, fill #0

## 2022-09-21 ENCOUNTER — Other Ambulatory Visit (HOSPITAL_COMMUNITY): Payer: Self-pay

## 2022-09-21 ENCOUNTER — Other Ambulatory Visit: Payer: Self-pay

## 2022-09-22 ENCOUNTER — Other Ambulatory Visit (HOSPITAL_COMMUNITY): Payer: Self-pay

## 2022-09-29 ENCOUNTER — Other Ambulatory Visit (HOSPITAL_COMMUNITY): Payer: Self-pay

## 2022-10-04 ENCOUNTER — Other Ambulatory Visit (HOSPITAL_COMMUNITY): Payer: Self-pay

## 2022-10-04 MED ORDER — TEMAZEPAM 15 MG PO CAPS
15.0000 mg | ORAL_CAPSULE | Freq: Every evening | ORAL | 5 refills | Status: DC | PRN
  Filled 2022-10-04: qty 30, 30d supply, fill #0
  Filled 2022-11-07: qty 30, 30d supply, fill #1
  Filled 2022-12-06: qty 30, 30d supply, fill #2
  Filled 2023-01-10: qty 30, 30d supply, fill #3
  Filled 2023-02-09: qty 30, 30d supply, fill #4

## 2022-10-05 ENCOUNTER — Other Ambulatory Visit (HOSPITAL_COMMUNITY): Payer: Self-pay

## 2022-10-07 ENCOUNTER — Other Ambulatory Visit (HOSPITAL_COMMUNITY): Payer: Self-pay

## 2022-10-10 ENCOUNTER — Other Ambulatory Visit (HOSPITAL_COMMUNITY): Payer: Self-pay

## 2022-10-10 MED ORDER — EPINEPHRINE 0.3 MG/0.3ML IJ SOAJ
INTRAMUSCULAR | 1 refills | Status: DC
  Filled 2022-10-10 – 2023-08-09 (×2): qty 2, 2d supply, fill #0

## 2022-10-11 ENCOUNTER — Other Ambulatory Visit (HOSPITAL_COMMUNITY): Payer: Self-pay

## 2022-10-18 ENCOUNTER — Other Ambulatory Visit (HOSPITAL_COMMUNITY): Payer: Self-pay

## 2022-10-18 MED ORDER — OZEMPIC (1 MG/DOSE) 4 MG/3ML ~~LOC~~ SOPN
1.0000 mg | PEN_INJECTOR | SUBCUTANEOUS | 1 refills | Status: AC
  Filled 2022-10-18: qty 3, 28d supply, fill #0
  Filled 2022-11-11: qty 3, 28d supply, fill #1

## 2022-10-24 ENCOUNTER — Other Ambulatory Visit (HOSPITAL_COMMUNITY): Payer: Self-pay

## 2022-10-24 DIAGNOSIS — M961 Postlaminectomy syndrome, not elsewhere classified: Secondary | ICD-10-CM | POA: Diagnosis not present

## 2022-10-24 DIAGNOSIS — F1190 Opioid use, unspecified, uncomplicated: Secondary | ICD-10-CM | POA: Diagnosis not present

## 2022-10-24 DIAGNOSIS — M546 Pain in thoracic spine: Secondary | ICD-10-CM | POA: Diagnosis not present

## 2022-10-24 DIAGNOSIS — M5412 Radiculopathy, cervical region: Secondary | ICD-10-CM | POA: Diagnosis not present

## 2022-10-24 DIAGNOSIS — Z6836 Body mass index (BMI) 36.0-36.9, adult: Secondary | ICD-10-CM | POA: Diagnosis not present

## 2022-10-24 MED ORDER — METHADONE HCL 10 MG PO TABS
10.0000 mg | ORAL_TABLET | Freq: Three times a day (TID) | ORAL | 0 refills | Status: AC
  Filled 2023-01-05: qty 90, 30d supply, fill #0

## 2022-10-24 MED ORDER — METHADONE HCL 10 MG PO TABS
10.0000 mg | ORAL_TABLET | Freq: Three times a day (TID) | ORAL | 0 refills | Status: AC
  Filled 2022-12-06: qty 90, 30d supply, fill #0

## 2022-10-25 ENCOUNTER — Other Ambulatory Visit (HOSPITAL_COMMUNITY): Payer: Self-pay

## 2022-11-07 ENCOUNTER — Other Ambulatory Visit (HOSPITAL_COMMUNITY): Payer: Self-pay

## 2022-11-07 ENCOUNTER — Other Ambulatory Visit: Payer: Self-pay

## 2022-11-21 DIAGNOSIS — E1169 Type 2 diabetes mellitus with other specified complication: Secondary | ICD-10-CM | POA: Diagnosis not present

## 2022-11-23 ENCOUNTER — Other Ambulatory Visit (HOSPITAL_COMMUNITY): Payer: Self-pay

## 2022-11-23 MED ORDER — OZEMPIC (2 MG/DOSE) 8 MG/3ML ~~LOC~~ SOPN
2.0000 mg | PEN_INJECTOR | SUBCUTANEOUS | 11 refills | Status: AC
  Filled 2022-11-23 – 2022-12-05 (×3): qty 3, 28d supply, fill #0
  Filled 2022-12-26: qty 3, 28d supply, fill #1
  Filled 2023-01-19: qty 3, 28d supply, fill #2
  Filled 2023-02-16: qty 3, 28d supply, fill #3
  Filled 2023-10-19: qty 3, 28d supply, fill #0

## 2022-11-24 ENCOUNTER — Other Ambulatory Visit (HOSPITAL_COMMUNITY): Payer: Self-pay

## 2022-12-05 ENCOUNTER — Other Ambulatory Visit (HOSPITAL_COMMUNITY): Payer: Self-pay

## 2022-12-06 ENCOUNTER — Other Ambulatory Visit (HOSPITAL_COMMUNITY): Payer: Self-pay

## 2022-12-07 ENCOUNTER — Other Ambulatory Visit (HOSPITAL_COMMUNITY): Payer: Self-pay

## 2022-12-07 ENCOUNTER — Other Ambulatory Visit: Payer: Self-pay

## 2022-12-09 ENCOUNTER — Other Ambulatory Visit (HOSPITAL_COMMUNITY): Payer: Self-pay

## 2022-12-26 ENCOUNTER — Other Ambulatory Visit (HOSPITAL_COMMUNITY): Payer: Self-pay

## 2022-12-27 ENCOUNTER — Other Ambulatory Visit (HOSPITAL_COMMUNITY): Payer: Self-pay

## 2022-12-28 ENCOUNTER — Other Ambulatory Visit (HOSPITAL_COMMUNITY): Payer: Self-pay

## 2023-01-05 ENCOUNTER — Other Ambulatory Visit (HOSPITAL_COMMUNITY): Payer: Self-pay

## 2023-01-05 ENCOUNTER — Other Ambulatory Visit: Payer: Self-pay

## 2023-01-05 MED ORDER — AMLODIPINE BESY-BENAZEPRIL HCL 5-20 MG PO CAPS
1.0000 | ORAL_CAPSULE | Freq: Every day | ORAL | 1 refills | Status: AC
  Filled 2023-03-06: qty 90, 90d supply, fill #0

## 2023-01-06 ENCOUNTER — Other Ambulatory Visit (HOSPITAL_COMMUNITY): Payer: Self-pay

## 2023-01-10 ENCOUNTER — Other Ambulatory Visit: Payer: Self-pay

## 2023-01-10 ENCOUNTER — Other Ambulatory Visit (HOSPITAL_COMMUNITY): Payer: Self-pay

## 2023-01-16 DIAGNOSIS — H35033 Hypertensive retinopathy, bilateral: Secondary | ICD-10-CM | POA: Diagnosis not present

## 2023-01-26 ENCOUNTER — Other Ambulatory Visit (HOSPITAL_COMMUNITY): Payer: Self-pay

## 2023-01-26 DIAGNOSIS — M961 Postlaminectomy syndrome, not elsewhere classified: Secondary | ICD-10-CM | POA: Diagnosis not present

## 2023-01-26 DIAGNOSIS — M5412 Radiculopathy, cervical region: Secondary | ICD-10-CM | POA: Diagnosis not present

## 2023-01-26 DIAGNOSIS — M546 Pain in thoracic spine: Secondary | ICD-10-CM | POA: Diagnosis not present

## 2023-01-26 MED ORDER — METHADONE HCL 10 MG PO TABS
10.0000 mg | ORAL_TABLET | Freq: Three times a day (TID) | ORAL | 0 refills | Status: AC
  Filled 2023-02-09: qty 90, 30d supply, fill #0

## 2023-01-26 MED ORDER — METHADONE HCL 10 MG PO TABS
10.0000 mg | ORAL_TABLET | Freq: Three times a day (TID) | ORAL | 0 refills | Status: AC
  Filled 2023-03-13: qty 90, 30d supply, fill #0

## 2023-01-26 MED ORDER — METHADONE HCL 10 MG PO TABS
10.0000 mg | ORAL_TABLET | Freq: Three times a day (TID) | ORAL | 0 refills | Status: AC
  Filled 2023-04-17: qty 90, 30d supply, fill #0

## 2023-02-09 ENCOUNTER — Other Ambulatory Visit (HOSPITAL_COMMUNITY): Payer: Self-pay

## 2023-02-10 ENCOUNTER — Other Ambulatory Visit (HOSPITAL_COMMUNITY): Payer: Self-pay

## 2023-02-15 ENCOUNTER — Other Ambulatory Visit (HOSPITAL_COMMUNITY): Payer: Self-pay

## 2023-02-15 MED ORDER — ATORVASTATIN CALCIUM 20 MG PO TABS
20.0000 mg | ORAL_TABLET | Freq: Every day | ORAL | 3 refills | Status: DC
  Filled 2023-02-15: qty 90, 90d supply, fill #0
  Filled 2023-05-18: qty 90, 90d supply, fill #1
  Filled 2023-08-28: qty 90, 90d supply, fill #0
  Filled 2023-11-22: qty 90, 90d supply, fill #1

## 2023-02-22 ENCOUNTER — Other Ambulatory Visit (HOSPITAL_COMMUNITY): Payer: Self-pay

## 2023-03-06 ENCOUNTER — Other Ambulatory Visit (HOSPITAL_COMMUNITY): Payer: Self-pay

## 2023-03-06 DIAGNOSIS — Z1321 Encounter for screening for nutritional disorder: Secondary | ICD-10-CM | POA: Diagnosis not present

## 2023-03-06 DIAGNOSIS — R350 Frequency of micturition: Secondary | ICD-10-CM | POA: Diagnosis not present

## 2023-03-06 DIAGNOSIS — I10 Essential (primary) hypertension: Secondary | ICD-10-CM | POA: Diagnosis not present

## 2023-03-06 DIAGNOSIS — J45909 Unspecified asthma, uncomplicated: Secondary | ICD-10-CM | POA: Diagnosis not present

## 2023-03-06 DIAGNOSIS — R12 Heartburn: Secondary | ICD-10-CM | POA: Diagnosis not present

## 2023-03-06 DIAGNOSIS — E1169 Type 2 diabetes mellitus with other specified complication: Secondary | ICD-10-CM | POA: Diagnosis not present

## 2023-03-06 DIAGNOSIS — E7800 Pure hypercholesterolemia, unspecified: Secondary | ICD-10-CM | POA: Diagnosis not present

## 2023-03-06 DIAGNOSIS — Z0000 Encounter for general adult medical examination without abnormal findings: Secondary | ICD-10-CM | POA: Diagnosis not present

## 2023-03-06 DIAGNOSIS — D649 Anemia, unspecified: Secondary | ICD-10-CM | POA: Diagnosis not present

## 2023-03-06 DIAGNOSIS — Z79899 Other long term (current) drug therapy: Secondary | ICD-10-CM | POA: Diagnosis not present

## 2023-03-06 DIAGNOSIS — E039 Hypothyroidism, unspecified: Secondary | ICD-10-CM | POA: Diagnosis not present

## 2023-03-06 MED ORDER — MOUNJARO 2.5 MG/0.5ML ~~LOC~~ SOAJ
2.5000 mg | SUBCUTANEOUS | 0 refills | Status: AC
  Filled 2023-03-06: qty 2, 28d supply, fill #0

## 2023-03-07 ENCOUNTER — Other Ambulatory Visit (HOSPITAL_COMMUNITY): Payer: Self-pay

## 2023-03-07 ENCOUNTER — Other Ambulatory Visit: Payer: Self-pay

## 2023-03-07 DIAGNOSIS — H5340 Unspecified visual field defects: Secondary | ICD-10-CM | POA: Diagnosis not present

## 2023-03-07 DIAGNOSIS — D352 Benign neoplasm of pituitary gland: Secondary | ICD-10-CM | POA: Diagnosis not present

## 2023-03-09 ENCOUNTER — Other Ambulatory Visit (HOSPITAL_COMMUNITY): Payer: Self-pay | Admitting: Internal Medicine

## 2023-03-09 DIAGNOSIS — D352 Benign neoplasm of pituitary gland: Secondary | ICD-10-CM

## 2023-03-13 ENCOUNTER — Other Ambulatory Visit (HOSPITAL_COMMUNITY): Payer: Self-pay

## 2023-03-17 ENCOUNTER — Ambulatory Visit (HOSPITAL_COMMUNITY)
Admission: RE | Admit: 2023-03-17 | Discharge: 2023-03-17 | Disposition: A | Discharge status: Home / Self Care | Payer: Medicare Other | Source: Ambulatory Visit | Attending: Internal Medicine | Admitting: Internal Medicine

## 2023-03-17 ENCOUNTER — Encounter (HOSPITAL_COMMUNITY): Payer: Self-pay

## 2023-03-17 DIAGNOSIS — D352 Benign neoplasm of pituitary gland: Secondary | ICD-10-CM

## 2023-03-20 ENCOUNTER — Other Ambulatory Visit (HOSPITAL_COMMUNITY): Payer: Self-pay

## 2023-03-20 MED ORDER — DIAZEPAM 2 MG PO TABS
ORAL_TABLET | Freq: Two times a day (BID) | ORAL | 0 refills | Status: AC
  Filled 2023-03-20: qty 6, 2d supply, fill #0

## 2023-03-27 ENCOUNTER — Other Ambulatory Visit (HOSPITAL_COMMUNITY): Payer: Self-pay

## 2023-03-28 ENCOUNTER — Other Ambulatory Visit (HOSPITAL_COMMUNITY): Payer: Self-pay

## 2023-03-28 ENCOUNTER — Other Ambulatory Visit: Payer: Self-pay

## 2023-03-28 MED ORDER — MOUNJARO 5 MG/0.5ML ~~LOC~~ SOAJ
5.0000 mg | SUBCUTANEOUS | 0 refills | Status: AC
  Filled 2023-03-28: qty 2, 28d supply, fill #0

## 2023-03-28 MED ORDER — DULOXETINE HCL 20 MG PO CPEP
20.0000 mg | ORAL_CAPSULE | Freq: Every day | ORAL | 2 refills | Status: AC
  Filled 2023-03-28: qty 30, 30d supply, fill #0

## 2023-03-29 ENCOUNTER — Encounter (HOSPITAL_COMMUNITY): Payer: Self-pay

## 2023-03-29 ENCOUNTER — Ambulatory Visit (HOSPITAL_COMMUNITY)
Admission: RE | Admit: 2023-03-29 | Discharge: 2023-03-29 | Disposition: A | Discharge status: Home / Self Care | Payer: Medicare Other | Source: Ambulatory Visit | Attending: Internal Medicine | Admitting: Internal Medicine

## 2023-03-29 DIAGNOSIS — D352 Benign neoplasm of pituitary gland: Secondary | ICD-10-CM | POA: Insufficient documentation

## 2023-03-29 MED ORDER — GADOBUTROL 1 MMOL/ML IV SOLN
10.0000 mL | Freq: Once | INTRAVENOUS | Status: AC | PRN
  Administered 2023-03-29: 10 mL via INTRAVENOUS

## 2023-04-17 ENCOUNTER — Other Ambulatory Visit (HOSPITAL_COMMUNITY): Payer: Self-pay

## 2023-04-24 ENCOUNTER — Other Ambulatory Visit (HOSPITAL_COMMUNITY): Payer: Self-pay

## 2023-04-24 DIAGNOSIS — D352 Benign neoplasm of pituitary gland: Secondary | ICD-10-CM | POA: Diagnosis not present

## 2023-04-24 MED ORDER — MOUNJARO 7.5 MG/0.5ML ~~LOC~~ SOAJ
7.5000 mg | SUBCUTANEOUS | 0 refills | Status: AC
  Filled 2023-04-24: qty 2, 28d supply, fill #0

## 2023-04-25 ENCOUNTER — Other Ambulatory Visit (HOSPITAL_COMMUNITY): Payer: Self-pay

## 2023-04-27 ENCOUNTER — Other Ambulatory Visit (HOSPITAL_COMMUNITY): Payer: Self-pay

## 2023-04-27 DIAGNOSIS — Z79891 Long term (current) use of opiate analgesic: Secondary | ICD-10-CM | POA: Diagnosis not present

## 2023-04-27 DIAGNOSIS — M5412 Radiculopathy, cervical region: Secondary | ICD-10-CM | POA: Diagnosis not present

## 2023-04-27 DIAGNOSIS — M961 Postlaminectomy syndrome, not elsewhere classified: Secondary | ICD-10-CM | POA: Diagnosis not present

## 2023-04-27 DIAGNOSIS — M546 Pain in thoracic spine: Secondary | ICD-10-CM | POA: Diagnosis not present

## 2023-04-27 MED ORDER — METHADONE HCL 10 MG PO TABS
10.0000 mg | ORAL_TABLET | Freq: Three times a day (TID) | ORAL | 0 refills | Status: AC
  Filled 2023-05-17: qty 90, 30d supply, fill #0

## 2023-04-27 MED ORDER — METHADONE HCL 10 MG PO TABS
10.0000 mg | ORAL_TABLET | Freq: Three times a day (TID) | ORAL | 0 refills | Status: AC
  Filled 2023-07-28: qty 90, 30d supply, fill #0

## 2023-04-27 MED ORDER — METHADONE HCL 10 MG PO TABS
10.0000 mg | ORAL_TABLET | Freq: Three times a day (TID) | ORAL | 0 refills | Status: AC
  Filled 2023-06-26: qty 80, 26d supply, fill #0
  Filled 2023-06-26: qty 10, 4d supply, fill #0

## 2023-05-03 ENCOUNTER — Other Ambulatory Visit (HOSPITAL_COMMUNITY): Payer: Self-pay

## 2023-05-03 MED ORDER — AMLODIPINE BESY-BENAZEPRIL HCL 5-40 MG PO CAPS
1.0000 | ORAL_CAPSULE | Freq: Every day | ORAL | 1 refills | Status: DC
  Filled 2023-05-03: qty 90, 90d supply, fill #0

## 2023-05-04 ENCOUNTER — Other Ambulatory Visit (HOSPITAL_COMMUNITY): Payer: Self-pay

## 2023-05-05 ENCOUNTER — Other Ambulatory Visit (HOSPITAL_COMMUNITY): Payer: Self-pay

## 2023-05-17 ENCOUNTER — Emergency Department (HOSPITAL_BASED_OUTPATIENT_CLINIC_OR_DEPARTMENT_OTHER): Payer: Medicare Other

## 2023-05-17 ENCOUNTER — Other Ambulatory Visit (HOSPITAL_COMMUNITY): Payer: Self-pay

## 2023-05-17 ENCOUNTER — Encounter (HOSPITAL_BASED_OUTPATIENT_CLINIC_OR_DEPARTMENT_OTHER): Payer: Self-pay | Admitting: Emergency Medicine

## 2023-05-17 ENCOUNTER — Emergency Department (HOSPITAL_BASED_OUTPATIENT_CLINIC_OR_DEPARTMENT_OTHER): Payer: Medicare Other | Admitting: Radiology

## 2023-05-17 ENCOUNTER — Emergency Department (HOSPITAL_BASED_OUTPATIENT_CLINIC_OR_DEPARTMENT_OTHER)
Admission: EM | Admit: 2023-05-17 | Discharge: 2023-05-17 | Disposition: A | Discharge status: Home / Self Care | Payer: Medicare Other | Attending: Emergency Medicine | Admitting: Emergency Medicine

## 2023-05-17 ENCOUNTER — Other Ambulatory Visit: Payer: Self-pay

## 2023-05-17 DIAGNOSIS — W010XXA Fall on same level from slipping, tripping and stumbling without subsequent striking against object, initial encounter: Secondary | ICD-10-CM

## 2023-05-17 DIAGNOSIS — M79602 Pain in left arm: Secondary | ICD-10-CM | POA: Diagnosis not present

## 2023-05-17 DIAGNOSIS — I10 Essential (primary) hypertension: Secondary | ICD-10-CM | POA: Diagnosis not present

## 2023-05-17 DIAGNOSIS — Z794 Long term (current) use of insulin: Secondary | ICD-10-CM | POA: Insufficient documentation

## 2023-05-17 DIAGNOSIS — M5030 Other cervical disc degeneration, unspecified cervical region: Secondary | ICD-10-CM | POA: Diagnosis not present

## 2023-05-17 DIAGNOSIS — S022XXA Fracture of nasal bones, initial encounter for closed fracture: Secondary | ICD-10-CM | POA: Diagnosis not present

## 2023-05-17 DIAGNOSIS — Z79899 Other long term (current) drug therapy: Secondary | ICD-10-CM | POA: Insufficient documentation

## 2023-05-17 DIAGNOSIS — S022XXB Fracture of nasal bones, initial encounter for open fracture: Secondary | ICD-10-CM | POA: Diagnosis not present

## 2023-05-17 DIAGNOSIS — W01198A Fall on same level from slipping, tripping and stumbling with subsequent striking against other object, initial encounter: Secondary | ICD-10-CM | POA: Diagnosis not present

## 2023-05-17 DIAGNOSIS — M25522 Pain in left elbow: Secondary | ICD-10-CM | POA: Diagnosis not present

## 2023-05-17 DIAGNOSIS — S80212A Abrasion, left knee, initial encounter: Secondary | ICD-10-CM | POA: Diagnosis not present

## 2023-05-17 DIAGNOSIS — S0990XA Unspecified injury of head, initial encounter: Secondary | ICD-10-CM | POA: Diagnosis not present

## 2023-05-17 DIAGNOSIS — Y92009 Unspecified place in unspecified non-institutional (private) residence as the place of occurrence of the external cause: Secondary | ICD-10-CM | POA: Insufficient documentation

## 2023-05-17 DIAGNOSIS — R220 Localized swelling, mass and lump, head: Secondary | ICD-10-CM | POA: Diagnosis not present

## 2023-05-17 DIAGNOSIS — Z8546 Personal history of malignant neoplasm of prostate: Secondary | ICD-10-CM | POA: Insufficient documentation

## 2023-05-17 DIAGNOSIS — G939 Disorder of brain, unspecified: Secondary | ICD-10-CM | POA: Diagnosis not present

## 2023-05-17 DIAGNOSIS — D352 Benign neoplasm of pituitary gland: Secondary | ICD-10-CM | POA: Diagnosis not present

## 2023-05-17 DIAGNOSIS — Z7982 Long term (current) use of aspirin: Secondary | ICD-10-CM | POA: Insufficient documentation

## 2023-05-17 DIAGNOSIS — S0121XA Laceration without foreign body of nose, initial encounter: Secondary | ICD-10-CM | POA: Insufficient documentation

## 2023-05-17 DIAGNOSIS — M50222 Other cervical disc displacement at C5-C6 level: Secondary | ICD-10-CM | POA: Diagnosis not present

## 2023-05-17 DIAGNOSIS — S199XXA Unspecified injury of neck, initial encounter: Secondary | ICD-10-CM | POA: Diagnosis not present

## 2023-05-17 DIAGNOSIS — S0992XA Unspecified injury of nose, initial encounter: Secondary | ICD-10-CM | POA: Diagnosis present

## 2023-05-17 DIAGNOSIS — M79642 Pain in left hand: Secondary | ICD-10-CM | POA: Diagnosis not present

## 2023-05-17 MED ORDER — CEPHALEXIN 250 MG PO CAPS
500.0000 mg | ORAL_CAPSULE | Freq: Once | ORAL | Status: AC
  Administered 2023-05-17: 500 mg via ORAL
  Filled 2023-05-17: qty 2

## 2023-05-17 MED ORDER — LIDOCAINE-EPINEPHRINE (PF) 2 %-1:200000 IJ SOLN
10.0000 mL | Freq: Once | INTRAMUSCULAR | Status: AC
  Administered 2023-05-17: 10 mL
  Filled 2023-05-17: qty 20

## 2023-05-17 MED ORDER — CEPHALEXIN 500 MG PO CAPS
500.0000 mg | ORAL_CAPSULE | Freq: Three times a day (TID) | ORAL | 0 refills | Status: AC
  Filled 2023-05-17: qty 20, 7d supply, fill #0

## 2023-05-17 NOTE — ED Provider Notes (Signed)
[place] [facility] AT [facility] Provider Note   CSN: [id number] Arrival date & time: 05/17/23  0325     History  Chief Complaint  Patient presents with   Roger Durham    DONTRELL Durham is a 67 y.o. male.  The history is provided by the patient.  Fall  He has history of hypertension, hyperlipidemia, prostate cancer, chronic back pain and comes in following a trip and fall at home.  He fell on his outstretched arms, and suffered a laceration to the bridge of his nose.  This tetanus immunization was 8 years ago.  He is complaining of pain from his elbow down through his left fourth and fifth fingers, but he is able to move his arm normally.  He denies loss of consciousness.  He is on aspirin but no other anticoagulants.  He also suffered minor abrasions to his left knee.   Home Medications Prior to Admission medications   Medication Sig Start Date End Date Taking? Authorizing Provider  acetaminophen (TYLENOL) 500 MG tablet Take 1,000 mg by mouth every 6 (six) hours as needed for pain.    [provider]  albuterol (VENTOLIN HFA) 108 (90 Base) MCG/ACT inhaler Inhale 2 puffs into the lungs every 4 (four) hours as needed 12/23/21     albuterol (VENTOLIN HFA) 108 (90 Base) MCG/ACT inhaler Inhale 2 puffs into the lungs every 4 (four) hours as needed. 03/02/22     albuterol (VENTOLIN HFA) 108 (90 Base) MCG/ACT inhaler Inhale 2 puffs into the lungs every 4 (four) hours as needed. 09/19/22     amLODipine-benazepril (LOTREL) 5-10 MG capsule TAKE 1 CAPSULE BY MOUTH ONCE A DAY 08/18/20 08/18/21  [name], MD  amLODipine-benazepril (LOTREL) 5-10 MG per capsule Take 1 capsule by mouth every morning.    [provider]  amLODipine-benazepril (LOTREL) 5-20 MG capsule Take 1 capsule by mouth daily. 01/05/23     aspirin 81 MG tablet Take 1 tablet (81 mg total) by mouth daily. 10/17/18   [name], MD  atorvastatin (LIPITOR) 10 MG tablet Take 10 mg by mouth at bedtime.     [provider]  atorvastatin (LIPITOR) 20 MG tablet Take 1 tablet (20 mg total) by mouth daily. 03/02/22     atorvastatin (LIPITOR) 20 MG tablet Take 1 tablet (20 mg total) by mouth daily. 02/15/23     beclomethasone (QVAR REDIHALER) 80 MCG/ACT inhaler Inhale 2 puffs into the lungs daily if needed 08/03/21     COVID-19 At Home Antigen Test [facility] COVID-19 HOME TEST) KIT Use as directed 10/11/21   [name], RPH  COVID-19 At [facility] Antigen Test (QUICKVUE AT-HOME COVID-19 TEST) KIT Use as directed. 05/20/21   [name], RPH  diazepam (VALIUM) 2 MG tablet Take 1-2 tablets by mouth up to twice daily as needed for anxiety for 2 days. 03/20/23     diclofenac Sodium (VOLTAREN) 1 % GEL Apply 1 application topically 4 (four) times daily as needed. 02/10/21     diclofenac Sodium (VOLTAREN) 1 % GEL Apply 4 g topically to lower extremity joint 4 (four) times daily as needed 03/31/22     diphenhydrAMINE (BENADRYL) 25 mg capsule Take 25 mg by mouth as needed for allergies.    [provider]  DULoxetine (CYMBALTA) 20 MG capsule Take 1 capsule (20 mg total) by mouth daily. 11/18/21     DULoxetine (CYMBALTA) 20 MG capsule Take 1 capsule (20 mg total) by mouth daily. 02/16/22  DULoxetine (CYMBALTA) 20 MG capsule Take 1 capsule (20 mg total) by mouth daily. 03/28/23     EPINEPHrine (EPIPEN 2-PAK) 0.3 mg/0.3 mL IJ SOAJ injection Inject as directed as needed. 12/14/21     EPINEPHrine (EPIPEN 2-PAK) 0.3 mg/0.3 mL IJ SOAJ injection Take as directed as needed 10/10/22     EPINEPHrine 0.3 mg/0.3 mL IJ SOAJ injection Inject 0.3 mg into the muscle as needed for anaphylaxis or allergies. 08/01/16   [provider]  EPINEPHrine 0.3 mg/0.3 mL IJ SOAJ injection use as directed as needed 12/31/20     fluticasone (FLOVENT HFA) 110 MCG/ACT inhaler Inhale 2 puffs into the lungs 2 (two) times daily. 08/17/21     fluticasone (FLOVENT HFA) 110 MCG/ACT inhaler Inhale 2 puffs into the lungs 2 (two) times daily  as needed. 12/23/21     fluticasone (FLOVENT HFA) 110 MCG/ACT inhaler Inhale 2 puffs into the lungs 2 (two) times daily. 03/02/22     fluticasone (FLOVENT HFA) 110 MCG/ACT inhaler Inhale 2 puffs into the lungs 2 (two) times daily. 09/15/22   [name], MD  fluticasone (FLOVENT HFA) 110 MCG/ACT inhaler Inhale 2 puffs into the lungs 2 (two) times daily. 09/19/22     influenza vaccine adjuvanted (FLUAD QUADRIVALENT) 0.5 ML injection Inject 0.5 mLs into the muscle. 05/26/22   [name], MD  levothyroxine (SYNTHROID) 112 MCG tablet Take 1 tablet (112 mcg total) by mouth daily in the morning on an empty stomach 07/19/21     levothyroxine (SYNTHROID) 125 MCG tablet Take 1 tablet (125 mcg total) by mouth every morning on an empty stomach. 07/19/22     methadone (DOLOPHINE) 10 MG tablet Take 1 tablet (10 mg total) by mouth every 8 (eight) hours for chronic pain. 03/21/21     methadone (DOLOPHINE) 10 MG tablet Take 1 tablet by mouth every 8 hours as needed for chronic pain (DNF 02/20/21) 02/20/21     methadone (DOLOPHINE) 10 MG tablet Take 1 tablet (10 mg total) by mouth every 8 (eight) hours for chronic pain 06/04/21     methadone (DOLOPHINE) 10 MG tablet Take 1 tablet (10 mg total) by mouth every 8 (eight) hours for chronic pain 07/03/21     methadone (DOLOPHINE) 10 MG tablet Take 1 tablet (10 mg total) by mouth every 8 (eight) hours for chronic pain 05/05/21     methadone (DOLOPHINE) 10 MG tablet Take 1 tablet (10 mg total) by mouth every 8 (eight) hours for chronic pain 09/09/21     methadone (DOLOPHINE) 10 MG tablet Take 1 tablet (10 mg total) by mouth every 8 (eight) hours for chronic pain (DNF 08/11/21) 08/11/21     methadone (DOLOPHINE) 10 MG tablet Take 1 tablet (10 mg total) by mouth every 8 (eight) hours for chronic pain 11/18/21     methadone (DOLOPHINE) 10 MG tablet Take 1 tablet (10 mg total) by mouth every 8 (eight) hours for chronic pain 01/16/22     methadone (DOLOPHINE) 10 MG tablet Take 1  tablet (10 mg total) by mouth every 8 (eight) hours for chronic pain 12/18/21     methadone (DOLOPHINE) 10 MG tablet Take 1 tablet (10 mg total) by mouth every 8 (eight) hours for chronic pain 03/18/22     methadone (DOLOPHINE) 10 MG tablet Take 1 tablet (10 mg total) by mouth every 8 (eight) hours for chronic pain 04/17/22     methadone (DOLOPHINE) 10 MG tablet Take 1 tablet (10 mg total) by mouth every 8 (  eight) hours for chronic pain 02/17/22     methadone (DOLOPHINE) 10 MG tablet Take 1 tablet (10 mg total) by mouth every 8 (eight) hours for chronic pain 05/27/22     methadone (DOLOPHINE) 10 MG tablet Take 1 tablet (10 mg total) by mouth every 8 (eight) hours for chronic pain 06/25/22     methadone (DOLOPHINE) 10 MG tablet Take 1 tablet (10 mg total) by mouth every 8 (eight) hours as needed for chronic pain (07-25-22) 07/25/22     methadone (DOLOPHINE) 10 MG tablet Take 1 tablet (10 mg total) by mouth every 8 (eight) hours for chronic pain 09/29/22 09/29/22     methadone (DOLOPHINE) 10 MG tablet Take 1 tablet (10 mg total) by mouth every 8 (eight) hours for chronic pain 10/29/22     methadone (DOLOPHINE) 10 MG tablet Take 1 tablet (10 mg total) by mouth every 8 (eight) hours for chronic pain 08/30/22 08/30/22     methadone (DOLOPHINE) 10 MG tablet Take 1 tablet (10 mg total) by mouth every 8 (eight) hours for chronic pain (DNF 11/26/22) 11/26/22     methadone (DOLOPHINE) 10 MG tablet Take 1 tablet (10 mg total) by mouth every 8 (eight) hours for chronic pain (DNF 12/26/22) 12/26/22     methadone (DOLOPHINE) 10 MG tablet Take 1 tablet (10 mg total) by mouth every 8 (eight) hours for chronic pain 02/03/23     methadone (DOLOPHINE) 10 MG tablet Take 1 tablet (10 mg total) by mouth every 8 (eight) hours for chronic pain (DNF 04/03/23) 04/03/23     methadone (DOLOPHINE) 10 MG tablet Take 1 tablet (10 mg total) by mouth every 8 (eight) hours for chronic pain 03/05/23     methadone (DOLOPHINE) 10 MG tablet Take 1 tablet (10  mg total) by mouth every 8 (eight) hours for chronic pain (DNF 05/17/23) 05/17/23     methadone (DOLOPHINE) 10 MG tablet Take 1 tablet (10 mg total) by mouth every 8 (eight) hours for chronic pain (DNF 07/15/23) 07/15/23     methadone (DOLOPHINE) 10 MG tablet Take 1 tablet (10 mg total) by mouth every 8 (eight) hours for chronic pain (DNF 06/15/23) 06/15/23     methocarbamol (ROBAXIN) 750 MG tablet Take 1 tablet (750 mg total) by mouth every 8 (eight) hours. 02/16/22     methocarbamol (ROBAXIN) 750 MG tablet Take 1 tablet (750 mg total) by mouth every 8 (eight) hours. 05/17/22     methocarbamol (ROBAXIN) 750 MG tablet Take 1 tablet (750 mg total) by mouth every 8 (eight) hours. 08/18/22     Semaglutide, 1 MG/DOSE, (OZEMPIC, 1 MG/DOSE,) 4 MG/3ML SOPN Inject 1 mg into the skin once a week. 10/18/22     Semaglutide, 2 MG/DOSE, (OZEMPIC, 2 MG/DOSE,) 8 MG/3ML SOPN Inject 2 mg into the skin once a week. 11/23/22     Semaglutide,0.25 or 0.5MG /DOS, (OZEMPIC, 0.25 OR 0.5 MG/DOSE,) 2 MG/3ML SOPN Inject 0.25 mg into the skin once a week for 4 weeks 09/19/22     temazepam (RESTORIL) 15 MG capsule Take 1 capsule (15 mg total) by mouth at bedtime as needed. 08/03/21     temazepam (RESTORIL) 15 MG capsule Take 1 capsule (15 mg total) by mouth at bedtime as needed. 03/02/22     temazepam (RESTORIL) 15 MG capsule Take 1 capsule (15 mg total) by mouth at bedtime as needed. 10/04/22     tirzepatide (MOUNJARO) 2.5 MG/0.5ML Pen Inject 2.5 mg into the skin once a week. 03/06/23  tirzepatide [facility]) 5 MG/0.5ML Pen Inject 5 mg into the skin once a week as directed 03/28/23     tirzepatide (MOUNJARO) 7.5 MG/0.5ML Pen Inject 7.5 mg into the skin once a week. 04/24/23         Allergies    Penicillins    Review of Systems   Review of Systems  All other systems reviewed and are negative.   Physical Exam Updated Vital Signs BP (!) 172/86 (BP Location: Right Arm)   Pulse 71   Temp 98 F (36.7 C) (Oral)   Resp 16   Wt 104.3 kg    SpO2 96%   BMI 35.76 kg/m  Physical Exam Vitals and nursing note reviewed.   67 year old male, resting comfortably and in no acute distress. Vital signs are significant for elevated blood pressure. Oxygen saturation is 96%, which is normal. Head is normocephalic.  Laceration is present across the bridge of the nose, but there is no swelling or deformity. PERRLA, EOMI. Oropharynx is clear. Neck is nontender. Back is nontender and there is no CVA tenderness. Lungs are clear without rales, wheezes, or rhonchi. Chest is nontender. Heart has regular rate and rhythm without murmur. Abdomen is soft, flat, nontender. Extremities: There is no swelling or deformity, full passive range of motion present all joints without pain.  There is tenderness palpation over the ulnar aspect of the left hand and over the left fourth and fifth fingers.  Minor abrasion present over the anterior aspect of the left knee. Skin is warm and dry without rash. Neurologic: Mental status is normal, cranial nerves are intact, moves all extremities equally.  ED Results / Procedures / Treatments    Radiology CT Cervical Spine Wo Contrast  Result Date: 05/17/2023 CLINICAL DATA:  Neck trauma EXAM: CT CERVICAL SPINE WITHOUT CONTRAST TECHNIQUE: Multidetector CT imaging of the cervical spine was performed without intravenous contrast. Multiplanar CT image reconstructions were also generated. RADIATION DOSE REDUCTION: This exam was performed according to the departmental dose-optimization program which includes automated exposure control, adjustment of the mA and/or kV according to patient size and/or use of iterative reconstruction technique. COMPARISON:  None Available. FINDINGS: Alignment: No traumatic malalignment Skull base and vertebrae: No acute fracture. No primary bone lesion or focal pathologic process. Soft tissues and spinal canal: No prevertebral fluid or swelling. No visible canal hematoma. Disc levels: Prominent  degenerative uncovertebral spurring and endplate ridging with extensive foraminal impingement throughout the cervical spine. Calcified disc herniation implies cord compression at C5-6. Upper chest: Biapical emphysema IMPRESSION: 1. Negative for fracture or subluxation. 2. Prominent cervical spine degeneration with extensive foraminal impingement and probable cord compression at C5-6. Electronically Signed   By: Tiburcio Pea M.D.   On: 05/17/2023 05:29   DG Hand Complete Left  Result Date: 05/17/2023 CLINICAL DATA:  Fall with left fourth and fifth digit pain. EXAM: LEFT HAND - COMPLETE 3 VIEW COMPARISON:  None Available. FINDINGS: There is no evidence of fracture or dislocation. No acute soft tissue finding. IMPRESSION: Negative for fracture or dislocation. Electronically Signed   By: Tiburcio Pea M.D.   On: 05/17/2023 04:52   DG Forearm Left  Result Date: 05/17/2023 CLINICAL DATA:  Fall with left elbow pain EXAM: LEFT FOREARM - 2 VIEW COMPARISON:  None Available. FINDINGS: There is no evidence of fracture or other focal bone lesions. Soft tissues are unremarkable. IMPRESSION: Negative. Electronically Signed   By: Tiburcio Pea M.D.   On: 05/17/2023 04:51   CT Head Wo Contrast  Result Date: 05/17/2023 CLINICAL DATA:  Fall in garage.  Blunt facial trauma. EXAM: CT HEAD WITHOUT CONTRAST CT MAXILLOFACIAL WITHOUT CONTRAST TECHNIQUE: Multidetector CT imaging of the head and maxillofacial structures were performed using the standard protocol without intravenous contrast. Multiplanar CT image reconstructions of the maxillofacial structures were also generated. RADIATION DOSE REDUCTION: This exam was performed according to the departmental dose-optimization program which includes automated exposure control, adjustment of the mA and/or kV according to patient size and/or use of iterative reconstruction technique. COMPARISON:  Brain MRI 03/29/2023 FINDINGS: CT HEAD FINDINGS Brain: No evidence of acute  infarction, hemorrhage, hydrocephalus, extra-axial collection. Bilobed sellar and suprasellar mass, known pituitary macro adenoma with chiasmatic impact. The mass has 2.4 cm craniocaudal span on sagittal face CT, similar to recent brain MRI. Vascular: No hyperdense vessel or unexpected calcification. Skull: Right forehead swelling without acute calvarial fracture. CT MAXILLOFACIAL FINDINGS Osseous: Mild nasal bone irregularity from fracture. No orbital or ethmoid continuation. Small volume gas along the underlying anterior nasal septum. The mandible is intact and located. Orbits: No visible injury Sinuses: Negative for hemosinus Soft tissues: No acute finding IMPRESSION: 1. No evidence of acute intracranial injury. 2. Nondisplaced nasal bone fractures. 3. Known pituitary macro adenoma. Electronically Signed   By: Tiburcio Pea M.D.   On: 05/17/2023 04:46   CT Maxillofacial Wo Contrast  Result Date: 05/17/2023 CLINICAL DATA:  Fall in garage.  Blunt facial trauma. EXAM: CT HEAD WITHOUT CONTRAST CT MAXILLOFACIAL WITHOUT CONTRAST TECHNIQUE: Multidetector CT imaging of the head and maxillofacial structures were performed using the standard protocol without intravenous contrast. Multiplanar CT image reconstructions of the maxillofacial structures were also generated. RADIATION DOSE REDUCTION: This exam was performed according to the departmental dose-optimization program which includes automated exposure control, adjustment of the mA and/or kV according to patient size and/or use of iterative reconstruction technique. COMPARISON:  Brain MRI 03/29/2023 FINDINGS: CT HEAD FINDINGS Brain: No evidence of acute infarction, hemorrhage, hydrocephalus, extra-axial collection. Bilobed sellar and suprasellar mass, known pituitary macro adenoma with chiasmatic impact. The mass has 2.4 cm craniocaudal span on sagittal face CT, similar to recent brain MRI. Vascular: No hyperdense vessel or unexpected calcification. Skull: Right  forehead swelling without acute calvarial fracture. CT MAXILLOFACIAL FINDINGS Osseous: Mild nasal bone irregularity from fracture. No orbital or ethmoid continuation. Small volume gas along the underlying anterior nasal septum. The mandible is intact and located. Orbits: No visible injury Sinuses: Negative for hemosinus Soft tissues: No acute finding IMPRESSION: 1. No evidence of acute intracranial injury. 2. Nondisplaced nasal bone fractures. 3. Known pituitary macro adenoma. Electronically Signed   By: Tiburcio Pea M.D.   On: 05/17/2023 04:46    Procedures .Marland KitchenLaceration Repair  Date/Time: 05/17/2023 5:14 AM  Performed by: Dione Booze, MD Authorized by: Dione Booze, MD   Consent:    Consent obtained:  Verbal   Consent given by:  Patient   Risks discussed:  Infection, pain and poor cosmetic result   Alternatives discussed:  No treatment Universal protocol:    Procedure explained and questions answered to patient or proxy's satisfaction: yes     Relevant documents present and verified: yes     Test results available: yes     Imaging studies available: yes     Required blood products, implants, devices, and special equipment available: yes     Site/side marked: yes     Immediately prior to procedure, a time out was called: yes     Patient identity confirmed:  Verbally with patient  and arm band Anesthesia:    Anesthesia method:  Local infiltration   Local anesthetic:  Lidocaine 2% WITH epi Laceration details:    Location:  Face   Face location:  Nose   Length (cm):  1   Depth (mm):  3 Pre-procedure details:    Preparation:  Patient was prepped and draped in usual sterile fashion and imaging obtained to evaluate for foreign bodies Exploration:    Limited defect created (wound extended): no     Hemostasis achieved with:  Direct pressure   Imaging obtained: x-ray     Imaging outcome: foreign body not noted     Wound exploration: entire depth of wound visualized     Wound extent: no  foreign body     Contaminated: no   Treatment:    Area cleansed with:  Saline   Amount of cleaning:  Standard   Debridement:  None   Undermining:  None   Scar revision: no   Skin repair:    Repair method:  Sutures   Suture size:  5-0   Suture material:  Prolene   Suture technique:  Simple interrupted   Number of sutures:  3 Approximation:    Approximation:  Close Repair type:    Repair type:  Simple Post-procedure details:    Dressing:  Antibiotic ointment and adhesive bandage   Procedure completion:  Tolerated well, no immediate complications     Medications Ordered in ED Medications  lidocaine-EPINEPHrine (XYLOCAINE W/EPI) 2 %-1:200000 (PF) injection 10 mL (10 mLs Infiltration Given by Other 05/17/23 0445)  cephALEXin (KEFLEX) capsule 500 mg (500 mg Oral Given 05/17/23 0544)    ED Course/ Medical Decision Making/ A&P                                 Medical Decision Making Amount and/or Complexity of Data Reviewed Radiology: ordered.  Risk Prescription drug management.   Follow-up with laceration of the nose and minor abrasion the left knee.  Left arm pain could be radicular versus peripheral nerve irritation, no swelling or deformity to suggest fracture.  I have ordered CT of head, maxillofacial bones, cervical spine and I have ordered plain x-rays of the left forearm and hand.  X-ray showed no evidence of fracture, CT scans show nondisplaced nasal fracture but no intracranial injury, significant degenerative changes of the cervical spine including calcified disc herniation with possible cord compression at C5-6.  This may account for his pain radiating down his left arm.  I have placed sutures in his nasal wound.  He is supposed to have surgery for a pituitary adenoma in 2 weeks.  I have ordered a dose of cephalexin and I am discharging him with a prescription for cephalexin.  Sutures should be removed in 7 days.  He is seeing an ENT physician for his pituitary adenoma  surgery, he can discuss his nasal fracture with that ENT physician.  Final Clinical Impression(s) / ED Diagnoses Final diagnoses:  Fall from slip, trip, or stumble, initial encounter  Open fracture of nasal bone, initial encounter  Laceration of nose, initial encounter  Pain in left arm  Elevated blood pressure reading with diagnosis of hypertension    Rx / DC Orders ED Discharge Orders          Ordered    cephALEXin (KEFLEX) 500 MG capsule  3 times daily        05/17/23 0559  Dione Booze, MD 05/17/23 [phone]

## 2023-05-17 NOTE — Discharge Instructions (Signed)
Keep your appointment with the ENT physician.  Apply ice for 30 minutes at a time, 4 times a day.  Ice should be applied to any place that is hurting.  You may take ibuprofen and/or acetaminophen as needed for pain.  You will be due for your next tetanus shot in 2026.

## 2023-05-17 NOTE — ED Notes (Signed)
Reviewed AVS with patient, patient expressed understanding of directions, denies further questions at this time. 

## 2023-05-17 NOTE — ED Notes (Signed)
Pt returned from CT at this time.  

## 2023-05-17 NOTE — ED Triage Notes (Signed)
Pt presents from home for trip in his garage onto concrete while getting up to turn off car alarm. Denies LOC.  Endorses L elbow pain running into L 4th and 5th digit. L knee pain.  Also struck head and experienced laceration to bridge of nose. Not on a blood thinner  H/o multiple back surgeries  Scheduled for pituitarey adenoma procedure  Sept 18.

## 2023-05-23 ENCOUNTER — Other Ambulatory Visit (HOSPITAL_COMMUNITY): Payer: Self-pay

## 2023-05-23 ENCOUNTER — Other Ambulatory Visit (HOSPITAL_BASED_OUTPATIENT_CLINIC_OR_DEPARTMENT_OTHER): Payer: Self-pay

## 2023-05-23 MED ORDER — AMLODIPINE BESY-BENAZEPRIL HCL 5-20 MG PO CAPS
1.0000 | ORAL_CAPSULE | Freq: Two times a day (BID) | ORAL | 0 refills | Status: DC
  Filled 2023-05-23 (×3): qty 180, 90d supply, fill #0

## 2023-05-23 MED ORDER — COMIRNATY 30 MCG/0.3ML IM SUSY
0.3000 mL | PREFILLED_SYRINGE | Freq: Once | INTRAMUSCULAR | 0 refills | Status: AC
  Filled 2023-05-23: qty 0.3, 1d supply, fill #0

## 2023-05-24 ENCOUNTER — Other Ambulatory Visit (HOSPITAL_BASED_OUTPATIENT_CLINIC_OR_DEPARTMENT_OTHER): Payer: Self-pay

## 2023-05-25 ENCOUNTER — Other Ambulatory Visit (HOSPITAL_COMMUNITY): Payer: Self-pay

## 2023-05-26 DIAGNOSIS — G9520 Unspecified cord compression: Secondary | ICD-10-CM | POA: Diagnosis not present

## 2023-05-26 DIAGNOSIS — I6782 Cerebral ischemia: Secondary | ICD-10-CM | POA: Diagnosis not present

## 2023-05-26 DIAGNOSIS — J310 Chronic rhinitis: Secondary | ICD-10-CM | POA: Diagnosis not present

## 2023-05-26 DIAGNOSIS — Z01818 Encounter for other preprocedural examination: Secondary | ICD-10-CM | POA: Diagnosis not present

## 2023-05-26 DIAGNOSIS — E039 Hypothyroidism, unspecified: Secondary | ICD-10-CM | POA: Diagnosis not present

## 2023-05-26 DIAGNOSIS — D352 Benign neoplasm of pituitary gland: Secondary | ICD-10-CM | POA: Diagnosis not present

## 2023-05-26 DIAGNOSIS — E236 Other disorders of pituitary gland: Secondary | ICD-10-CM | POA: Diagnosis not present

## 2023-05-26 DIAGNOSIS — J3489 Other specified disorders of nose and nasal sinuses: Secondary | ICD-10-CM | POA: Diagnosis not present

## 2023-05-26 DIAGNOSIS — I672 Cerebral atherosclerosis: Secondary | ICD-10-CM | POA: Diagnosis not present

## 2023-05-26 DIAGNOSIS — E7849 Other hyperlipidemia: Secondary | ICD-10-CM | POA: Diagnosis not present

## 2023-05-26 DIAGNOSIS — I10 Essential (primary) hypertension: Secondary | ICD-10-CM | POA: Diagnosis not present

## 2023-05-29 ENCOUNTER — Other Ambulatory Visit (HOSPITAL_COMMUNITY): Payer: Self-pay

## 2023-05-31 DIAGNOSIS — D352 Benign neoplasm of pituitary gland: Secondary | ICD-10-CM | POA: Diagnosis not present

## 2023-05-31 DIAGNOSIS — E039 Hypothyroidism, unspecified: Secondary | ICD-10-CM | POA: Diagnosis not present

## 2023-05-31 DIAGNOSIS — E785 Hyperlipidemia, unspecified: Secondary | ICD-10-CM | POA: Diagnosis not present

## 2023-05-31 DIAGNOSIS — R001 Bradycardia, unspecified: Secondary | ICD-10-CM | POA: Diagnosis not present

## 2023-05-31 DIAGNOSIS — I10 Essential (primary) hypertension: Secondary | ICD-10-CM | POA: Diagnosis not present

## 2023-05-31 DIAGNOSIS — Z7982 Long term (current) use of aspirin: Secondary | ICD-10-CM | POA: Diagnosis not present

## 2023-05-31 DIAGNOSIS — Z87891 Personal history of nicotine dependence: Secondary | ICD-10-CM | POA: Diagnosis not present

## 2023-05-31 DIAGNOSIS — M50222 Other cervical disc displacement at C5-C6 level: Secondary | ICD-10-CM | POA: Diagnosis not present

## 2023-05-31 DIAGNOSIS — Z79899 Other long term (current) drug therapy: Secondary | ICD-10-CM | POA: Diagnosis not present

## 2023-05-31 DIAGNOSIS — J338 Other polyp of sinus: Secondary | ICD-10-CM | POA: Diagnosis not present

## 2023-05-31 DIAGNOSIS — G8929 Other chronic pain: Secondary | ICD-10-CM | POA: Diagnosis not present

## 2023-05-31 DIAGNOSIS — J343 Hypertrophy of nasal turbinates: Secondary | ICD-10-CM | POA: Diagnosis not present

## 2023-05-31 DIAGNOSIS — J449 Chronic obstructive pulmonary disease, unspecified: Secondary | ICD-10-CM | POA: Diagnosis not present

## 2023-06-19 DIAGNOSIS — J310 Chronic rhinitis: Secondary | ICD-10-CM | POA: Diagnosis not present

## 2023-06-19 DIAGNOSIS — J339 Nasal polyp, unspecified: Secondary | ICD-10-CM | POA: Diagnosis not present

## 2023-06-19 DIAGNOSIS — J322 Chronic ethmoidal sinusitis: Secondary | ICD-10-CM | POA: Diagnosis not present

## 2023-06-19 DIAGNOSIS — E236 Other disorders of pituitary gland: Secondary | ICD-10-CM | POA: Diagnosis not present

## 2023-06-22 DIAGNOSIS — M25511 Pain in right shoulder: Secondary | ICD-10-CM | POA: Diagnosis not present

## 2023-06-22 DIAGNOSIS — S46011A Strain of muscle(s) and tendon(s) of the rotator cuff of right shoulder, initial encounter: Secondary | ICD-10-CM | POA: Diagnosis not present

## 2023-06-26 ENCOUNTER — Other Ambulatory Visit (HOSPITAL_COMMUNITY): Payer: Self-pay

## 2023-06-28 DIAGNOSIS — M542 Cervicalgia: Secondary | ICD-10-CM | POA: Diagnosis not present

## 2023-06-28 DIAGNOSIS — G8929 Other chronic pain: Secondary | ICD-10-CM | POA: Diagnosis not present

## 2023-06-28 DIAGNOSIS — M5013 Cervical disc disorder with radiculopathy, cervicothoracic region: Secondary | ICD-10-CM | POA: Diagnosis not present

## 2023-06-29 DIAGNOSIS — G8929 Other chronic pain: Secondary | ICD-10-CM | POA: Diagnosis not present

## 2023-06-29 DIAGNOSIS — M5013 Cervical disc disorder with radiculopathy, cervicothoracic region: Secondary | ICD-10-CM | POA: Diagnosis not present

## 2023-06-29 DIAGNOSIS — M4722 Other spondylosis with radiculopathy, cervical region: Secondary | ICD-10-CM | POA: Diagnosis not present

## 2023-06-29 DIAGNOSIS — S46011A Strain of muscle(s) and tendon(s) of the rotator cuff of right shoulder, initial encounter: Secondary | ICD-10-CM | POA: Diagnosis not present

## 2023-06-29 DIAGNOSIS — M25511 Pain in right shoulder: Secondary | ICD-10-CM | POA: Diagnosis not present

## 2023-06-29 DIAGNOSIS — R531 Weakness: Secondary | ICD-10-CM | POA: Diagnosis not present

## 2023-06-29 DIAGNOSIS — G9520 Unspecified cord compression: Secondary | ICD-10-CM | POA: Diagnosis not present

## 2023-06-29 DIAGNOSIS — S46811A Strain of other muscles, fascia and tendons at shoulder and upper arm level, right arm, initial encounter: Secondary | ICD-10-CM | POA: Diagnosis not present

## 2023-06-29 DIAGNOSIS — M4802 Spinal stenosis, cervical region: Secondary | ICD-10-CM | POA: Diagnosis not present

## 2023-06-29 DIAGNOSIS — M4313 Spondylolisthesis, cervicothoracic region: Secondary | ICD-10-CM | POA: Diagnosis not present

## 2023-06-29 DIAGNOSIS — M542 Cervicalgia: Secondary | ICD-10-CM | POA: Diagnosis not present

## 2023-07-03 ENCOUNTER — Other Ambulatory Visit (HOSPITAL_COMMUNITY): Payer: Self-pay

## 2023-07-03 DIAGNOSIS — D352 Benign neoplasm of pituitary gland: Secondary | ICD-10-CM | POA: Diagnosis not present

## 2023-07-04 ENCOUNTER — Other Ambulatory Visit (HOSPITAL_COMMUNITY): Payer: Self-pay

## 2023-07-05 ENCOUNTER — Other Ambulatory Visit: Payer: Self-pay

## 2023-07-05 ENCOUNTER — Other Ambulatory Visit (HOSPITAL_COMMUNITY): Payer: Self-pay

## 2023-07-05 ENCOUNTER — Other Ambulatory Visit (HOSPITAL_BASED_OUTPATIENT_CLINIC_OR_DEPARTMENT_OTHER): Payer: Self-pay

## 2023-07-05 MED ORDER — LEVOTHYROXINE SODIUM 125 MCG PO TABS
125.0000 ug | ORAL_TABLET | Freq: Every morning | ORAL | 3 refills | Status: AC
  Filled 2023-07-05 (×2): qty 90, 90d supply, fill #0

## 2023-07-05 MED ORDER — PREDNISONE 5 MG PO TABS
5.0000 mg | ORAL_TABLET | Freq: Every day | ORAL | 1 refills | Status: DC
  Filled 2023-07-05: qty 30, 30d supply, fill #0

## 2023-07-05 MED ORDER — FAMOTIDINE 20 MG PO TABS
20.0000 mg | ORAL_TABLET | Freq: Two times a day (BID) | ORAL | 1 refills | Status: AC
  Filled 2023-07-05: qty 60, 30d supply, fill #0
  Filled 2023-07-28: qty 60, 30d supply, fill #1

## 2023-07-05 MED ORDER — LEVOTHYROXINE SODIUM 125 MCG PO TABS
125.0000 ug | ORAL_TABLET | Freq: Every day | ORAL | 3 refills | Status: AC
  Filled 2023-07-05 – 2023-07-06 (×2): qty 90, 90d supply, fill #0
  Filled 2023-10-19 (×2): qty 90, 90d supply, fill #1
  Filled 2024-01-10: qty 90, 90d supply, fill #2
  Filled 2024-04-12: qty 90, 90d supply, fill #3

## 2023-07-05 MED ORDER — TEMAZEPAM 15 MG PO CAPS
15.0000 mg | ORAL_CAPSULE | Freq: Every evening | ORAL | 0 refills | Status: AC | PRN
  Filled 2023-07-05 – 2023-07-06 (×4): qty 30, 30d supply, fill #0

## 2023-07-06 ENCOUNTER — Other Ambulatory Visit (HOSPITAL_COMMUNITY): Payer: Self-pay

## 2023-07-06 ENCOUNTER — Other Ambulatory Visit (HOSPITAL_BASED_OUTPATIENT_CLINIC_OR_DEPARTMENT_OTHER): Payer: Self-pay

## 2023-07-06 DIAGNOSIS — S46011A Strain of muscle(s) and tendon(s) of the rotator cuff of right shoulder, initial encounter: Secondary | ICD-10-CM | POA: Diagnosis not present

## 2023-07-06 MED ORDER — PREDNISONE 2.5 MG PO TABS
2.5000 mg | ORAL_TABLET | Freq: Every day | ORAL | 0 refills | Status: DC
  Filled 2023-07-06: qty 30, 30d supply, fill #0
  Filled 2023-07-06: qty 90, 90d supply, fill #0
  Filled 2023-07-28: qty 30, 30d supply, fill #1
  Filled 2023-08-28: qty 30, 30d supply, fill #2

## 2023-07-12 DIAGNOSIS — M5002 Cervical disc disorder with myelopathy, mid-cervical region, unspecified level: Secondary | ICD-10-CM | POA: Diagnosis not present

## 2023-07-19 ENCOUNTER — Other Ambulatory Visit (HOSPITAL_BASED_OUTPATIENT_CLINIC_OR_DEPARTMENT_OTHER): Payer: Self-pay

## 2023-07-19 DIAGNOSIS — Z01818 Encounter for other preprocedural examination: Secondary | ICD-10-CM | POA: Diagnosis not present

## 2023-07-19 MED ORDER — RSVPREF3 VAC RECOMB ADJUVANTED 120 MCG/0.5ML IM SUSR
0.5000 mL | Freq: Once | INTRAMUSCULAR | 0 refills | Status: AC
  Filled 2023-07-19: qty 0.5, 1d supply, fill #0

## 2023-07-24 DIAGNOSIS — R202 Paresthesia of skin: Secondary | ICD-10-CM | POA: Diagnosis not present

## 2023-07-24 DIAGNOSIS — M4722 Other spondylosis with radiculopathy, cervical region: Secondary | ICD-10-CM | POA: Diagnosis not present

## 2023-07-24 DIAGNOSIS — M2578 Osteophyte, vertebrae: Secondary | ICD-10-CM | POA: Diagnosis not present

## 2023-07-24 DIAGNOSIS — Z87891 Personal history of nicotine dependence: Secondary | ICD-10-CM | POA: Diagnosis not present

## 2023-07-24 DIAGNOSIS — M4802 Spinal stenosis, cervical region: Secondary | ICD-10-CM | POA: Diagnosis not present

## 2023-07-24 DIAGNOSIS — Z981 Arthrodesis status: Secondary | ICD-10-CM | POA: Diagnosis not present

## 2023-07-24 DIAGNOSIS — R531 Weakness: Secondary | ICD-10-CM | POA: Diagnosis not present

## 2023-07-24 DIAGNOSIS — Z79899 Other long term (current) drug therapy: Secondary | ICD-10-CM | POA: Diagnosis not present

## 2023-07-24 DIAGNOSIS — I10 Essential (primary) hypertension: Secondary | ICD-10-CM | POA: Diagnosis not present

## 2023-07-24 DIAGNOSIS — M4712 Other spondylosis with myelopathy, cervical region: Secondary | ICD-10-CM | POA: Diagnosis not present

## 2023-07-24 DIAGNOSIS — Z9181 History of falling: Secondary | ICD-10-CM | POA: Diagnosis not present

## 2023-07-24 DIAGNOSIS — J449 Chronic obstructive pulmonary disease, unspecified: Secondary | ICD-10-CM | POA: Diagnosis not present

## 2023-07-24 DIAGNOSIS — E039 Hypothyroidism, unspecified: Secondary | ICD-10-CM | POA: Diagnosis not present

## 2023-07-28 ENCOUNTER — Other Ambulatory Visit: Payer: Self-pay

## 2023-07-28 ENCOUNTER — Other Ambulatory Visit (HOSPITAL_BASED_OUTPATIENT_CLINIC_OR_DEPARTMENT_OTHER): Payer: Self-pay

## 2023-07-29 ENCOUNTER — Other Ambulatory Visit (HOSPITAL_BASED_OUTPATIENT_CLINIC_OR_DEPARTMENT_OTHER): Payer: Self-pay

## 2023-08-03 ENCOUNTER — Other Ambulatory Visit (HOSPITAL_COMMUNITY): Payer: Self-pay

## 2023-08-03 DIAGNOSIS — M546 Pain in thoracic spine: Secondary | ICD-10-CM | POA: Diagnosis not present

## 2023-08-03 DIAGNOSIS — M5412 Radiculopathy, cervical region: Secondary | ICD-10-CM | POA: Diagnosis not present

## 2023-08-03 DIAGNOSIS — M961 Postlaminectomy syndrome, not elsewhere classified: Secondary | ICD-10-CM | POA: Diagnosis not present

## 2023-08-03 MED ORDER — METHADONE HCL 10 MG PO TABS
10.0000 mg | ORAL_TABLET | Freq: Three times a day (TID) | ORAL | 0 refills | Status: AC
  Filled 2023-11-09: qty 90, 30d supply, fill #0

## 2023-08-03 MED ORDER — METHOCARBAMOL 750 MG PO TABS
750.0000 mg | ORAL_TABLET | Freq: Three times a day (TID) | ORAL | 11 refills | Status: AC
  Filled 2023-08-03: qty 90, 30d supply, fill #0

## 2023-08-03 MED ORDER — METHADONE HCL 10 MG PO TABS
10.0000 mg | ORAL_TABLET | Freq: Three times a day (TID) | ORAL | 0 refills | Status: AC
  Filled 2023-09-29 – 2023-10-02 (×2): qty 90, 30d supply, fill #0

## 2023-08-03 MED ORDER — METHADONE HCL 10 MG PO TABS
10.0000 mg | ORAL_TABLET | Freq: Three times a day (TID) | ORAL | 0 refills | Status: AC
  Filled 2023-08-29: qty 90, 30d supply, fill #0

## 2023-08-09 ENCOUNTER — Other Ambulatory Visit (HOSPITAL_BASED_OUTPATIENT_CLINIC_OR_DEPARTMENT_OTHER): Payer: Self-pay

## 2023-08-22 ENCOUNTER — Other Ambulatory Visit (HOSPITAL_BASED_OUTPATIENT_CLINIC_OR_DEPARTMENT_OTHER): Payer: Self-pay

## 2023-08-22 ENCOUNTER — Other Ambulatory Visit: Payer: Self-pay

## 2023-08-22 DIAGNOSIS — H35372 Puckering of macula, left eye: Secondary | ICD-10-CM | POA: Diagnosis not present

## 2023-08-22 DIAGNOSIS — H26492 Other secondary cataract, left eye: Secondary | ICD-10-CM | POA: Diagnosis not present

## 2023-08-22 DIAGNOSIS — H43813 Vitreous degeneration, bilateral: Secondary | ICD-10-CM | POA: Diagnosis not present

## 2023-08-22 DIAGNOSIS — H524 Presbyopia: Secondary | ICD-10-CM | POA: Diagnosis not present

## 2023-08-22 DIAGNOSIS — H52203 Unspecified astigmatism, bilateral: Secondary | ICD-10-CM | POA: Diagnosis not present

## 2023-08-22 MED ORDER — AMOXICILLIN-POT CLAVULANATE 875-125 MG PO TABS
1.0000 | ORAL_TABLET | Freq: Two times a day (BID) | ORAL | 0 refills | Status: AC
  Filled 2023-08-22: qty 10, 5d supply, fill #0

## 2023-08-24 ENCOUNTER — Other Ambulatory Visit (HOSPITAL_BASED_OUTPATIENT_CLINIC_OR_DEPARTMENT_OTHER): Payer: Self-pay

## 2023-08-24 ENCOUNTER — Other Ambulatory Visit (HOSPITAL_COMMUNITY): Payer: Self-pay

## 2023-08-24 DIAGNOSIS — R3129 Other microscopic hematuria: Secondary | ICD-10-CM | POA: Diagnosis not present

## 2023-08-24 MED ORDER — GEMTESA 75 MG PO TABS
75.0000 mg | ORAL_TABLET | Freq: Every day | ORAL | 11 refills | Status: AC
  Filled 2023-08-24 (×2): qty 30, 30d supply, fill #0

## 2023-08-24 MED ORDER — FLUAD 0.5 ML IM SUSY
0.5000 mL | PREFILLED_SYRINGE | Freq: Once | INTRAMUSCULAR | 0 refills | Status: AC
  Filled 2023-08-24: qty 0.5, 1d supply, fill #0

## 2023-08-28 ENCOUNTER — Other Ambulatory Visit (HOSPITAL_BASED_OUTPATIENT_CLINIC_OR_DEPARTMENT_OTHER): Payer: Self-pay

## 2023-08-29 ENCOUNTER — Other Ambulatory Visit (HOSPITAL_BASED_OUTPATIENT_CLINIC_OR_DEPARTMENT_OTHER): Payer: Self-pay

## 2023-08-29 ENCOUNTER — Other Ambulatory Visit: Payer: Self-pay

## 2023-08-29 MED ORDER — AMLODIPINE BESY-BENAZEPRIL HCL 5-20 MG PO CAPS
1.0000 | ORAL_CAPSULE | Freq: Two times a day (BID) | ORAL | 0 refills | Status: DC
  Filled 2023-08-29: qty 180, 90d supply, fill #0

## 2023-09-01 DIAGNOSIS — R12 Heartburn: Secondary | ICD-10-CM | POA: Diagnosis not present

## 2023-09-01 DIAGNOSIS — J449 Chronic obstructive pulmonary disease, unspecified: Secondary | ICD-10-CM | POA: Diagnosis not present

## 2023-09-01 DIAGNOSIS — I10 Essential (primary) hypertension: Secondary | ICD-10-CM | POA: Diagnosis not present

## 2023-09-01 DIAGNOSIS — E039 Hypothyroidism, unspecified: Secondary | ICD-10-CM | POA: Diagnosis not present

## 2023-09-01 DIAGNOSIS — J45909 Unspecified asthma, uncomplicated: Secondary | ICD-10-CM | POA: Diagnosis not present

## 2023-09-01 DIAGNOSIS — E1169 Type 2 diabetes mellitus with other specified complication: Secondary | ICD-10-CM | POA: Diagnosis not present

## 2023-09-01 DIAGNOSIS — D352 Benign neoplasm of pituitary gland: Secondary | ICD-10-CM | POA: Diagnosis not present

## 2023-09-01 DIAGNOSIS — E119 Type 2 diabetes mellitus without complications: Secondary | ICD-10-CM | POA: Diagnosis not present

## 2023-09-01 DIAGNOSIS — E7800 Pure hypercholesterolemia, unspecified: Secondary | ICD-10-CM | POA: Diagnosis not present

## 2023-09-11 DIAGNOSIS — M5002 Cervical disc disorder with myelopathy, mid-cervical region, unspecified level: Secondary | ICD-10-CM | POA: Diagnosis not present

## 2023-09-11 DIAGNOSIS — M4322 Fusion of spine, cervical region: Secondary | ICD-10-CM | POA: Diagnosis not present

## 2023-09-11 DIAGNOSIS — M75101 Unspecified rotator cuff tear or rupture of right shoulder, not specified as traumatic: Secondary | ICD-10-CM | POA: Diagnosis not present

## 2023-09-11 DIAGNOSIS — M12811 Other specific arthropathies, not elsewhere classified, right shoulder: Secondary | ICD-10-CM | POA: Diagnosis not present

## 2023-09-26 DIAGNOSIS — K5730 Diverticulosis of large intestine without perforation or abscess without bleeding: Secondary | ICD-10-CM | POA: Diagnosis not present

## 2023-09-26 DIAGNOSIS — R3129 Other microscopic hematuria: Secondary | ICD-10-CM | POA: Diagnosis not present

## 2023-09-29 ENCOUNTER — Other Ambulatory Visit (HOSPITAL_BASED_OUTPATIENT_CLINIC_OR_DEPARTMENT_OTHER): Payer: Self-pay

## 2023-09-29 ENCOUNTER — Other Ambulatory Visit (HOSPITAL_COMMUNITY): Payer: Self-pay

## 2023-10-02 ENCOUNTER — Other Ambulatory Visit (HOSPITAL_BASED_OUTPATIENT_CLINIC_OR_DEPARTMENT_OTHER): Payer: Self-pay

## 2023-10-02 ENCOUNTER — Other Ambulatory Visit (HOSPITAL_COMMUNITY): Payer: Self-pay

## 2023-10-05 ENCOUNTER — Other Ambulatory Visit (HOSPITAL_BASED_OUTPATIENT_CLINIC_OR_DEPARTMENT_OTHER): Payer: Self-pay

## 2023-10-05 MED ORDER — OZEMPIC (0.25 OR 0.5 MG/DOSE) 2 MG/3ML ~~LOC~~ SOPN
PEN_INJECTOR | SUBCUTANEOUS | 0 refills | Status: AC
  Filled 2023-10-05: qty 3, 28d supply, fill #0

## 2023-10-19 ENCOUNTER — Other Ambulatory Visit (HOSPITAL_BASED_OUTPATIENT_CLINIC_OR_DEPARTMENT_OTHER): Payer: Self-pay

## 2023-10-19 DIAGNOSIS — R3129 Other microscopic hematuria: Secondary | ICD-10-CM | POA: Diagnosis not present

## 2023-10-20 ENCOUNTER — Other Ambulatory Visit (HOSPITAL_BASED_OUTPATIENT_CLINIC_OR_DEPARTMENT_OTHER): Payer: Self-pay

## 2023-10-20 MED ORDER — OZEMPIC (1 MG/DOSE) 4 MG/3ML ~~LOC~~ SOPN
1.0000 mg | PEN_INJECTOR | SUBCUTANEOUS | 0 refills | Status: DC
  Filled 2023-10-20: qty 3, 28d supply, fill #0

## 2023-11-06 DIAGNOSIS — I152 Hypertension secondary to endocrine disorders: Secondary | ICD-10-CM | POA: Diagnosis not present

## 2023-11-06 DIAGNOSIS — E119 Type 2 diabetes mellitus without complications: Secondary | ICD-10-CM | POA: Diagnosis not present

## 2023-11-06 DIAGNOSIS — M12811 Other specific arthropathies, not elsewhere classified, right shoulder: Secondary | ICD-10-CM | POA: Diagnosis not present

## 2023-11-06 DIAGNOSIS — M75101 Unspecified rotator cuff tear or rupture of right shoulder, not specified as traumatic: Secondary | ICD-10-CM | POA: Diagnosis not present

## 2023-11-06 DIAGNOSIS — E785 Hyperlipidemia, unspecified: Secondary | ICD-10-CM | POA: Diagnosis not present

## 2023-11-06 DIAGNOSIS — D649 Anemia, unspecified: Secondary | ICD-10-CM | POA: Diagnosis not present

## 2023-11-07 DIAGNOSIS — H5340 Unspecified visual field defects: Secondary | ICD-10-CM | POA: Diagnosis not present

## 2023-11-09 ENCOUNTER — Other Ambulatory Visit (HOSPITAL_COMMUNITY): Payer: Self-pay

## 2023-11-09 DIAGNOSIS — M961 Postlaminectomy syndrome, not elsewhere classified: Secondary | ICD-10-CM | POA: Diagnosis not present

## 2023-11-09 DIAGNOSIS — M546 Pain in thoracic spine: Secondary | ICD-10-CM | POA: Diagnosis not present

## 2023-11-09 DIAGNOSIS — M5412 Radiculopathy, cervical region: Secondary | ICD-10-CM | POA: Diagnosis not present

## 2023-11-09 MED ORDER — METHADONE HCL 10 MG PO TABS
10.0000 mg | ORAL_TABLET | Freq: Three times a day (TID) | ORAL | 0 refills | Status: AC | PRN
  Filled 2023-12-15: qty 90, 30d supply, fill #0

## 2023-11-09 MED ORDER — METHADONE HCL 10 MG PO TABS
10.0000 mg | ORAL_TABLET | Freq: Three times a day (TID) | ORAL | 0 refills | Status: AC | PRN
  Filled 2024-01-17: qty 90, 30d supply, fill #0

## 2023-11-13 DIAGNOSIS — G939 Disorder of brain, unspecified: Secondary | ICD-10-CM | POA: Diagnosis not present

## 2023-11-13 DIAGNOSIS — D352 Benign neoplasm of pituitary gland: Secondary | ICD-10-CM | POA: Diagnosis not present

## 2023-11-17 DIAGNOSIS — Z880 Allergy status to penicillin: Secondary | ICD-10-CM | POA: Diagnosis not present

## 2023-11-17 DIAGNOSIS — M25511 Pain in right shoulder: Secondary | ICD-10-CM | POA: Diagnosis not present

## 2023-11-17 DIAGNOSIS — G8918 Other acute postprocedural pain: Secondary | ICD-10-CM | POA: Diagnosis not present

## 2023-11-17 DIAGNOSIS — Z7982 Long term (current) use of aspirin: Secondary | ICD-10-CM | POA: Diagnosis not present

## 2023-11-17 DIAGNOSIS — M12811 Other specific arthropathies, not elsewhere classified, right shoulder: Secondary | ICD-10-CM | POA: Diagnosis not present

## 2023-11-17 DIAGNOSIS — E039 Hypothyroidism, unspecified: Secondary | ICD-10-CM | POA: Diagnosis not present

## 2023-11-17 DIAGNOSIS — Z96611 Presence of right artificial shoulder joint: Secondary | ICD-10-CM | POA: Diagnosis not present

## 2023-11-17 DIAGNOSIS — Z87891 Personal history of nicotine dependence: Secondary | ICD-10-CM | POA: Diagnosis not present

## 2023-11-17 DIAGNOSIS — Z471 Aftercare following joint replacement surgery: Secondary | ICD-10-CM | POA: Diagnosis not present

## 2023-11-17 DIAGNOSIS — G8929 Other chronic pain: Secondary | ICD-10-CM | POA: Diagnosis not present

## 2023-11-17 DIAGNOSIS — J449 Chronic obstructive pulmonary disease, unspecified: Secondary | ICD-10-CM | POA: Diagnosis not present

## 2023-11-17 DIAGNOSIS — M75101 Unspecified rotator cuff tear or rupture of right shoulder, not specified as traumatic: Secondary | ICD-10-CM | POA: Diagnosis not present

## 2023-11-17 DIAGNOSIS — Z79899 Other long term (current) drug therapy: Secondary | ICD-10-CM | POA: Diagnosis not present

## 2023-11-17 DIAGNOSIS — M75111 Incomplete rotator cuff tear or rupture of right shoulder, not specified as traumatic: Secondary | ICD-10-CM | POA: Diagnosis not present

## 2023-11-17 DIAGNOSIS — I10 Essential (primary) hypertension: Secondary | ICD-10-CM | POA: Diagnosis not present

## 2023-11-17 DIAGNOSIS — S46011A Strain of muscle(s) and tendon(s) of the rotator cuff of right shoulder, initial encounter: Secondary | ICD-10-CM | POA: Diagnosis not present

## 2023-11-18 DIAGNOSIS — G8929 Other chronic pain: Secondary | ICD-10-CM | POA: Diagnosis not present

## 2023-11-18 DIAGNOSIS — I10 Essential (primary) hypertension: Secondary | ICD-10-CM | POA: Diagnosis not present

## 2023-11-18 DIAGNOSIS — Z880 Allergy status to penicillin: Secondary | ICD-10-CM | POA: Diagnosis not present

## 2023-11-18 DIAGNOSIS — E039 Hypothyroidism, unspecified: Secondary | ICD-10-CM | POA: Diagnosis not present

## 2023-11-18 DIAGNOSIS — S46011A Strain of muscle(s) and tendon(s) of the rotator cuff of right shoulder, initial encounter: Secondary | ICD-10-CM | POA: Diagnosis not present

## 2023-11-18 DIAGNOSIS — Z7982 Long term (current) use of aspirin: Secondary | ICD-10-CM | POA: Diagnosis not present

## 2023-11-18 DIAGNOSIS — Z79899 Other long term (current) drug therapy: Secondary | ICD-10-CM | POA: Diagnosis not present

## 2023-11-18 DIAGNOSIS — J449 Chronic obstructive pulmonary disease, unspecified: Secondary | ICD-10-CM | POA: Diagnosis not present

## 2023-11-18 DIAGNOSIS — Z87891 Personal history of nicotine dependence: Secondary | ICD-10-CM | POA: Diagnosis not present

## 2023-11-18 DIAGNOSIS — G8918 Other acute postprocedural pain: Secondary | ICD-10-CM | POA: Diagnosis not present

## 2023-11-18 DIAGNOSIS — M12811 Other specific arthropathies, not elsewhere classified, right shoulder: Secondary | ICD-10-CM | POA: Diagnosis not present

## 2023-11-18 DIAGNOSIS — M25511 Pain in right shoulder: Secondary | ICD-10-CM | POA: Diagnosis not present

## 2023-11-18 NOTE — Discharge Summary (Signed)
 Orthopedic Surgery Discharge Summary  Patient ID: Roger Durham [id number] 68 y.o. 1956/05/23  Admit date: 11/17/2023 Admitting Physician: Morene Gavel, MD Admission Diagnoses:  Rotator cuff tear arthropathy, right [M75.101, M12.811] Acute postoperative pain of shoulder [G89.18, M25.519]    Discharge date: 11/18/2023   Discharge Physician: Morene Gavel, MD Discharge Diagnoses:  Principal Problem (Resolved):   Rotator cuff tear arthropathy, right Active Problems:   Impaired mobility and ADLs Resolved Problems:   Acute postoperative pain of shoulder   Surgical operations performed during hospitalization: Procedure(s) (LRB): REVERSE TOTAL SHOULDER ARTHROPLASTY, RIGHT SHOULDER (Right)   Operative Procedures: Procedure(s): REVERSE TOTAL SHOULDER ARTHROPLASTY, RIGHT SHOULDER  Hospital Course:  68 y.o. male   presented to the OR on the above named dates for the above named procedures.  They were able to tolerate the procedure without difficulty and was transferred to the Orthopedic surgery service for postoperative management, pain control, therapy, and discharge planning.    Postoperative recovery was uneventful without major complications. The patient's pain was initially managed with multimodal pain medication including shoulder block, OnQ pump. By the time of discharge, patient's vital signs were stable in no acute distress, the patient verbalizes adequate pain control on PO pain medication & catheter, were tolerating food and liquid intake normally, and was voiding independently. The patient worked with Physical and Occupational therapy and is safe to discharge to Home or Self Care at this time.  The patient meets all goals necessary for discharge from a medical, surgical, and therapy standpoint, and thus the patient is stable and safe for discharge.  New discharge medications discussed in detail and the patient stated understanding of use and administration.  The patient is  verbalizing understanding of all discharge instructions and therefore was released.       Consults:  Please see accompanying consult notes  Disposition: Home or Self Care  Discharge Exam:  GENERAL: No acute distress. Well nourished, well hydrated. Alert and oriented x 4   RUE:  Dressing c/d/i. Sling/bump in place Motor intact firing PIN, AIN, ulnar motor n. SILT superficial radial, median, ulnar nerve distributions. Slightly decreased in C6-C7 2/2 block Palpable radial pulse. Fingers WWP brisk cap refill.  Diet: you may resume your regular home diet Wound Care: Dressing and Surgical Incision:   After surgery, your surgical dressing needs to be changed daily with dry / sterile gauze and tape for the first few weeks, especially if there is bleeding or drainage from surgery present.   Please DO NOT APPLY ANYTHING TO YOUR INCISION.  This means NO lotions, cremes, salves, hydrogen peroxide, alcohol, betadine or iodine, deodorants, etc, as this can interfere with healing and lead to infection. Applying intermittent ice (in a waterproof bag or container) to your surgery dressing will help with swelling and pain during the first few weeks after surgery. Do not let your incision get wet for the first 5 days after surgery.  If your incision does become wet, pat it dry and leave open to air until the incision is dry enough to replace the dressing.   It is important that your incision does not soak in water  -- if skin "pruning" occurs, healing will be delayed and the risk of infection increases.   Showering may begin on the 5th day after surgery:  Simply let the water  run over the incision and make sure not to scrub.  Pat your incision and shoulder dry.  Also, make sure your armpit is dry to avoid irritation while you wear your sling.  If you are a smoker, please do your best to avoid smoking and the use of nicotine products (patches, chewing gum) as this can slow down early  healing.  Activity/Weight Bearing Status:  Sling: You will be given a sling to wear for the first 4 weeks following surgery, unless instructed otherwise.   It is important that you wear this during the day and at night to help prevent excessive motion after shoulder replacement.  If you are sitting in a predictable environment it is Ok to remove the sling for range of motion exercises of the fingers, wrist, and elbow (see #3 below).   Make sure to put your sling back on after it has been removed.   The sling can be confusing to wear at first, so please don't hesitate to ask your nursing staff and/or Occupational Therapist to assist you with this before discharge from the hospital.    Exercises: It is important to remove your sling multiple times per day to perform finger, wrist, and elbow range of motion exercises.  Fully bend and straighten your elbow and wrist, and rotate your forearm (palm up, then palm down).  Use your other hand to fully straighten all of your fingers.  Next, bend your fingers to make a full fist.  This will help prevent stiffness in the joints below your new shoulder replacement.   As pain allows you can begin to use the arm for LIGHT activity, no more than 5 lbs.  Please note that it is normal for certain motions to cause pain.  For this reason, we ask that you please "listen" to your shoulder and do your best to avoid "pushing through pain" early in the recovery process.     4) Physical Therapy (PT):   All Anatomic (regular) and Hemiarthroplasty Shoulder Replacement patients will be given a PT referral at your first postoperative visit, with PT typically starting between weeks 2 and 3 after surgery.   Patients that undergo Reverse Shoulder Replacement are not typically referred to formal physical therapy due to risks of complications that can occur early in the post-operative period, however, some patients may be asked to perform PT on a case-by-case basis.  DVT prophylaxis:  Aspirin  81mg  twice daily    The [place]  Controlled Substance Reporting System was reviewed for Roger Durham prior to providing Roger Durham with discharge narcotic prescription.   What the STOP Act means to You -On March 10, 2016, Governor [place] signed the Strengthen Opioid Misuse Prevention (STOP) Act of 2017 into law -The STOP Act is aimed at curtailing the opioid abuse epidemic in [place]  -This legislation, which is supported by the [place]  [employer] ([employer]), impacts how opioids are prescribed -The STOP Act limits the number of opioids prescribed following surgery of no more than a seven day supply    You will receive a seven day supply of pain medication in accordance to this legislation. If you think you will need prescription medication beyond the prescribed seven days, please contact the orthopaedic offices on day five so that our clinical staff can discuss options with you.  Please contact your doctor or seek medical assistance if you experience any of the following:  1. Fever greater than 101.5 F. 2. Decrease in urinary output. 3. Increased warmth, swelling, redness, or pain at your incision/wound site. 4. Increased drainage or bad odor at your incision/wound site.  5. Nausea/vomiting that does not stop.  6. Numbness, tingling, or discoloration of extremity.  7. Unable to drink fluids.  8. Uncontrollable pain.  9. Any other concerning symptoms.   Please, call 911 or go to your nearest emergency room if you experience any of the following:  1. Change in speech, vision, or ability to walk 2. Chest pain.  3. Difficulty breathing or shortness of breath.  5. Follow Up:  -A follow up appointment should be scheduled for you for approximately 2-3 weeks after discharge, and our secretaries will be contacting you with the details of that appointment. However, If you do not hear about your appoinment in 3 business days, please call the provided  surgery clinic number and confirm/schedule your appointment.     Scheduled Future Appointments       Provider Department Dept Phone Center   11/29/2023 2:40 PM Dorn Jama Blush [facility] [place] [phone] [facility] [place]   11/30/2023 1:20 PM Tia Rumalda Corner [facility] - Orthopedics Hand [facility] [phone] [facility] [place]   12/04/2023 1:00 PM Garnette Bouquet [facility]  - Neurosurgery [phone] [facility] [place]   12/28/2023 11:00 AM Morene Gavel [facility] - Orthopedics Hand [place] [phone] [facility] [place]   06/17/2024 2:40 PM Bard Gander [facility] - [facility]PM ENT [phone] [facility] [place] Prentice Verneta Rosalea, MD  Time Spent on Discharge: 30 minutes   Attending Attestation: I personally reviewed and agree with the assessment and plan as documented in the attached note.  Morene Gavel, MD Hand, Elbow, Shoulder Surgery [facility] 11/19/2023 3:37 PM  Electronically signed by: Morene Gavel, MD 11/19/2023 3:37 PM

## 2023-11-21 ENCOUNTER — Other Ambulatory Visit (HOSPITAL_COMMUNITY): Payer: Self-pay

## 2023-11-21 ENCOUNTER — Other Ambulatory Visit (HOSPITAL_BASED_OUTPATIENT_CLINIC_OR_DEPARTMENT_OTHER): Payer: Self-pay

## 2023-11-21 MED ORDER — OZEMPIC (1 MG/DOSE) 4 MG/3ML ~~LOC~~ SOPN
1.0000 mg | PEN_INJECTOR | SUBCUTANEOUS | 0 refills | Status: DC
  Filled 2023-11-21 (×2): qty 3, 28d supply, fill #0

## 2023-11-22 ENCOUNTER — Other Ambulatory Visit (HOSPITAL_BASED_OUTPATIENT_CLINIC_OR_DEPARTMENT_OTHER): Payer: Self-pay

## 2023-11-23 ENCOUNTER — Other Ambulatory Visit (HOSPITAL_BASED_OUTPATIENT_CLINIC_OR_DEPARTMENT_OTHER): Payer: Self-pay

## 2023-11-23 ENCOUNTER — Other Ambulatory Visit: Payer: Self-pay

## 2023-11-23 MED ORDER — AMLODIPINE BESY-BENAZEPRIL HCL 5-20 MG PO CAPS
1.0000 | ORAL_CAPSULE | Freq: Two times a day (BID) | ORAL | 3 refills | Status: AC
  Filled 2023-11-23: qty 180, 90d supply, fill #0
  Filled 2024-02-15: qty 180, 90d supply, fill #1

## 2023-11-24 ENCOUNTER — Other Ambulatory Visit: Payer: Self-pay

## 2023-11-24 ENCOUNTER — Other Ambulatory Visit (HOSPITAL_BASED_OUTPATIENT_CLINIC_OR_DEPARTMENT_OTHER): Payer: Self-pay

## 2023-11-25 ENCOUNTER — Other Ambulatory Visit (HOSPITAL_BASED_OUTPATIENT_CLINIC_OR_DEPARTMENT_OTHER): Payer: Self-pay

## 2023-11-29 DIAGNOSIS — M542 Cervicalgia: Secondary | ICD-10-CM | POA: Diagnosis not present

## 2023-11-29 DIAGNOSIS — G8929 Other chronic pain: Secondary | ICD-10-CM | POA: Diagnosis not present

## 2023-11-29 DIAGNOSIS — M4322 Fusion of spine, cervical region: Secondary | ICD-10-CM | POA: Diagnosis not present

## 2023-11-29 DIAGNOSIS — M545 Low back pain, unspecified: Secondary | ICD-10-CM | POA: Diagnosis not present

## 2023-11-30 DIAGNOSIS — Z9889 Other specified postprocedural states: Secondary | ICD-10-CM | POA: Diagnosis not present

## 2023-11-30 DIAGNOSIS — S46011A Strain of muscle(s) and tendon(s) of the rotator cuff of right shoulder, initial encounter: Secondary | ICD-10-CM | POA: Diagnosis not present

## 2023-11-30 DIAGNOSIS — Z96611 Presence of right artificial shoulder joint: Secondary | ICD-10-CM | POA: Diagnosis not present

## 2023-12-04 DIAGNOSIS — D352 Benign neoplasm of pituitary gland: Secondary | ICD-10-CM | POA: Diagnosis not present

## 2023-12-15 ENCOUNTER — Other Ambulatory Visit (HOSPITAL_BASED_OUTPATIENT_CLINIC_OR_DEPARTMENT_OTHER): Payer: Self-pay

## 2023-12-15 ENCOUNTER — Other Ambulatory Visit (HOSPITAL_COMMUNITY): Payer: Self-pay

## 2023-12-15 MED ORDER — OZEMPIC (1 MG/DOSE) 4 MG/3ML ~~LOC~~ SOPN
1.0000 mg | PEN_INJECTOR | SUBCUTANEOUS | 0 refills | Status: DC
  Filled 2023-12-15: qty 3, 28d supply, fill #0

## 2023-12-28 DIAGNOSIS — S46011A Strain of muscle(s) and tendon(s) of the rotator cuff of right shoulder, initial encounter: Secondary | ICD-10-CM | POA: Diagnosis not present

## 2023-12-28 DIAGNOSIS — Z471 Aftercare following joint replacement surgery: Secondary | ICD-10-CM | POA: Diagnosis not present

## 2023-12-28 DIAGNOSIS — Z9889 Other specified postprocedural states: Secondary | ICD-10-CM | POA: Diagnosis not present

## 2023-12-28 DIAGNOSIS — Z96641 Presence of right artificial hip joint: Secondary | ICD-10-CM | POA: Diagnosis not present

## 2023-12-29 ENCOUNTER — Other Ambulatory Visit (HOSPITAL_COMMUNITY): Payer: Self-pay

## 2023-12-29 ENCOUNTER — Other Ambulatory Visit (HOSPITAL_BASED_OUTPATIENT_CLINIC_OR_DEPARTMENT_OTHER): Payer: Self-pay

## 2023-12-29 MED ORDER — JOURNAVX 50 MG PO TABS
50.0000 mg | ORAL_TABLET | Freq: Two times a day (BID) | ORAL | 0 refills | Status: DC
  Filled 2023-12-29 – 2024-03-06 (×2): qty 28, 14d supply, fill #0

## 2024-01-08 ENCOUNTER — Other Ambulatory Visit (HOSPITAL_BASED_OUTPATIENT_CLINIC_OR_DEPARTMENT_OTHER): Payer: Self-pay

## 2024-01-08 MED ORDER — OZEMPIC (1 MG/DOSE) 4 MG/3ML ~~LOC~~ SOPN
1.0000 mg | PEN_INJECTOR | SUBCUTANEOUS | 0 refills | Status: DC
Start: 2024-01-08 — End: 2024-02-06
  Filled 2024-01-08: qty 3, 28d supply, fill #0

## 2024-01-11 ENCOUNTER — Other Ambulatory Visit (HOSPITAL_BASED_OUTPATIENT_CLINIC_OR_DEPARTMENT_OTHER): Payer: Self-pay

## 2024-01-11 ENCOUNTER — Other Ambulatory Visit: Payer: Self-pay

## 2024-01-17 ENCOUNTER — Other Ambulatory Visit (HOSPITAL_COMMUNITY): Payer: Self-pay

## 2024-01-17 ENCOUNTER — Other Ambulatory Visit (HOSPITAL_BASED_OUTPATIENT_CLINIC_OR_DEPARTMENT_OTHER): Payer: Self-pay

## 2024-01-18 ENCOUNTER — Other Ambulatory Visit (HOSPITAL_BASED_OUTPATIENT_CLINIC_OR_DEPARTMENT_OTHER): Payer: Self-pay

## 2024-01-25 ENCOUNTER — Other Ambulatory Visit (HOSPITAL_BASED_OUTPATIENT_CLINIC_OR_DEPARTMENT_OTHER): Payer: Self-pay

## 2024-01-25 ENCOUNTER — Other Ambulatory Visit: Payer: Self-pay

## 2024-01-25 DIAGNOSIS — M961 Postlaminectomy syndrome, not elsewhere classified: Secondary | ICD-10-CM | POA: Diagnosis not present

## 2024-01-25 DIAGNOSIS — M546 Pain in thoracic spine: Secondary | ICD-10-CM | POA: Diagnosis not present

## 2024-01-25 DIAGNOSIS — M5412 Radiculopathy, cervical region: Secondary | ICD-10-CM | POA: Diagnosis not present

## 2024-01-25 MED ORDER — METHADONE HCL 10 MG PO TABS
10.0000 mg | ORAL_TABLET | Freq: Three times a day (TID) | ORAL | 0 refills | Status: AC | PRN
  Filled 2024-04-29: qty 90, 30d supply, fill #0

## 2024-01-25 MED ORDER — METHADONE HCL 10 MG PO TABS
10.0000 mg | ORAL_TABLET | Freq: Three times a day (TID) | ORAL | 0 refills | Status: AC
  Filled 2024-03-26: qty 90, 30d supply, fill #0

## 2024-01-25 MED ORDER — METHOCARBAMOL 750 MG PO TABS
750.0000 mg | ORAL_TABLET | Freq: Three times a day (TID) | ORAL | 11 refills | Status: AC
  Filled 2024-01-25: qty 90, 30d supply, fill #0

## 2024-01-25 MED ORDER — METHADONE HCL 10 MG PO TABS
10.0000 mg | ORAL_TABLET | Freq: Three times a day (TID) | ORAL | 0 refills | Status: AC
  Filled 2024-02-21: qty 90, 30d supply, fill #0

## 2024-01-26 ENCOUNTER — Other Ambulatory Visit (HOSPITAL_BASED_OUTPATIENT_CLINIC_OR_DEPARTMENT_OTHER): Payer: Self-pay

## 2024-02-06 ENCOUNTER — Other Ambulatory Visit (HOSPITAL_BASED_OUTPATIENT_CLINIC_OR_DEPARTMENT_OTHER): Payer: Self-pay

## 2024-02-06 MED ORDER — OZEMPIC (1 MG/DOSE) 4 MG/3ML ~~LOC~~ SOPN
1.0000 mg | PEN_INJECTOR | SUBCUTANEOUS | 0 refills | Status: DC
  Filled 2024-02-06: qty 3, 28d supply, fill #0

## 2024-02-15 ENCOUNTER — Other Ambulatory Visit (HOSPITAL_BASED_OUTPATIENT_CLINIC_OR_DEPARTMENT_OTHER): Payer: Self-pay

## 2024-02-16 ENCOUNTER — Other Ambulatory Visit (HOSPITAL_BASED_OUTPATIENT_CLINIC_OR_DEPARTMENT_OTHER): Payer: Self-pay

## 2024-02-16 ENCOUNTER — Other Ambulatory Visit: Payer: Self-pay

## 2024-02-16 MED ORDER — ATORVASTATIN CALCIUM 20 MG PO TABS
20.0000 mg | ORAL_TABLET | Freq: Every day | ORAL | 0 refills | Status: DC
  Filled 2024-02-16: qty 90, 90d supply, fill #0

## 2024-02-21 ENCOUNTER — Other Ambulatory Visit (HOSPITAL_BASED_OUTPATIENT_CLINIC_OR_DEPARTMENT_OTHER): Payer: Self-pay

## 2024-02-22 ENCOUNTER — Other Ambulatory Visit (HOSPITAL_BASED_OUTPATIENT_CLINIC_OR_DEPARTMENT_OTHER): Payer: Self-pay

## 2024-02-23 ENCOUNTER — Other Ambulatory Visit (HOSPITAL_BASED_OUTPATIENT_CLINIC_OR_DEPARTMENT_OTHER): Payer: Self-pay

## 2024-03-04 DIAGNOSIS — Z510 Encounter for antineoplastic radiation therapy: Secondary | ICD-10-CM | POA: Diagnosis not present

## 2024-03-04 DIAGNOSIS — D352 Benign neoplasm of pituitary gland: Secondary | ICD-10-CM | POA: Diagnosis not present

## 2024-03-05 ENCOUNTER — Other Ambulatory Visit (HOSPITAL_COMMUNITY): Payer: Self-pay

## 2024-03-05 ENCOUNTER — Other Ambulatory Visit (HOSPITAL_BASED_OUTPATIENT_CLINIC_OR_DEPARTMENT_OTHER): Payer: Self-pay

## 2024-03-05 DIAGNOSIS — D352 Benign neoplasm of pituitary gland: Secondary | ICD-10-CM | POA: Diagnosis not present

## 2024-03-05 DIAGNOSIS — Z510 Encounter for antineoplastic radiation therapy: Secondary | ICD-10-CM | POA: Diagnosis not present

## 2024-03-05 MED ORDER — JOURNAVX 50 MG PO TABS
50.0000 mg | ORAL_TABLET | Freq: Two times a day (BID) | ORAL | 24 refills | Status: DC
  Filled 2024-03-05 – 2024-03-18 (×2): qty 30, 15d supply, fill #0

## 2024-03-05 MED ORDER — OZEMPIC (1 MG/DOSE) 4 MG/3ML ~~LOC~~ SOPN
1.0000 mg | PEN_INJECTOR | SUBCUTANEOUS | 0 refills | Status: DC
  Filled 2024-03-05: qty 3, 28d supply, fill #0

## 2024-03-05 NOTE — Progress Notes (Addendum)
 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   Post-operative Diagnosis:  Same.   Procedure:  Gamma Knife stereotactic radiosurgery, complex.   Surgeon:  Garnette NOVAK. Bouquet, M.D., Ph.D.   Anesthesia:  Local.   Operative Indications: We discussed the risks and potential benefits of radiosurgery, repeat transsphenoidal surgery or craniotomy, medical treatment with long-acting somatostatin or dopamine-agonists, and observation. We emphasized the risks of pituitary insufficiency (with or without Gamma Knife) emphasizing the potentially life threatening risks of hypoadrenalism. We discussed the risks of radiation in detail including the relative sensitivity of the optic apparatus to lower fraction number  radiation and to single fraction radiation resulting in the possibility of permanent blindness as a result of the treatment of the tumor.   Particular attention was paid to the relative risks of hypofractionated Gamma Knife and the alternative treatment options. The smaller exerpience with hypofractionated radiotherapy in comparison to single fraction stereotactic radiosurgery or conventional fractionation regima was considered. The possibility and consequences of local and of out-of-field failure were discussed.  We discussed the optimal minimization of risk of radiation effects to the brain including the mesial temporal lobes and covered the potential temporary or permanent loss of any or all neurologic functions. The low risk of radiation- induced malignant and low grade tumor risks were presented. We discussed radiation necrosis and pituitary apoplexy and possible need for surgery or glucocorticoids as a result and the risks of each of these. We discussed the roles of all of the members of  the radiosurgical team and the potential benefits and risks of overlapping procedures; informed consent for overlapping procedures was obtained.    [name] expresses good understanding of the relative risks and potential benefits of the available alternatives and has chosen hypofractionated radiosurgery with the [facility].   Description of the Procedure:  Magnetic resonance imaging was obtained including high-resolution, post intravenous gadolinium, volumetric images through the lesion. A cone-beam CT scan was also obtained in the [employer] ICON immobilizing system. The images were coregistered.    The images were transferred to the [facility] workstation.  The tumor was outlined using the Regions and Volume tool of the [employer].  A multiple isocenter plan was created that nicely encompassed the tumor.  Dr. Michael D. Chan then prescribed a dose of 20 Gy to the 48% isodose line to be divided into four fractions which conformed nicely to the tumor-volume. The maximum calculated dose to the optic apparatus was less than 14.5 Gy.   [name] was admitted to the [facility] Knife ICON suite where the pre-procedure verification (time out) was performed per institutional protocol. He was immobilized in the treatment position using the ICON system. Spacial accuracy of the plan was adjusted and confirmed with another cone beam ICON CT per the manufacturer's recommendations.    Prophylactic antibiotics are not indicated for [facility] radiosurgery because of the absence of a surgical incision. DVT prophylaxis is not indicated during Gamma Knife radiosurgery for [name] because no anesthesia is employed and frequent extremity movement/ambulation occurs throughout the procedure.   The radiosurgical dose was administered without incident. No incision was made nor were instruments or sponges inserted; thus all sponges and instruments were accounted for.   [name] was then discharged to home with plans  for follow up with a brain MRI scan in approximately six  months. He knows to call or seek medical attention before then should any problems or questions arise that are potentially related to the tumor or to the Gamma Knife treatment.       My contact information is: Department of Neurosurgery [facility] of Medicine [facility] [place], [place] [address] Direct Phone: [phone]

## 2024-03-06 ENCOUNTER — Other Ambulatory Visit (HOSPITAL_BASED_OUTPATIENT_CLINIC_OR_DEPARTMENT_OTHER): Payer: Self-pay

## 2024-03-06 DIAGNOSIS — Z510 Encounter for antineoplastic radiation therapy: Secondary | ICD-10-CM | POA: Diagnosis not present

## 2024-03-06 DIAGNOSIS — D352 Benign neoplasm of pituitary gland: Secondary | ICD-10-CM | POA: Diagnosis not present

## 2024-03-07 ENCOUNTER — Other Ambulatory Visit (HOSPITAL_BASED_OUTPATIENT_CLINIC_OR_DEPARTMENT_OTHER): Payer: Self-pay

## 2024-03-07 DIAGNOSIS — D352 Benign neoplasm of pituitary gland: Secondary | ICD-10-CM | POA: Diagnosis not present

## 2024-03-07 DIAGNOSIS — Z510 Encounter for antineoplastic radiation therapy: Secondary | ICD-10-CM | POA: Diagnosis not present

## 2024-03-08 DIAGNOSIS — D352 Benign neoplasm of pituitary gland: Secondary | ICD-10-CM | POA: Diagnosis not present

## 2024-03-08 DIAGNOSIS — Z510 Encounter for antineoplastic radiation therapy: Secondary | ICD-10-CM | POA: Diagnosis not present

## 2024-03-14 DIAGNOSIS — S46011A Strain of muscle(s) and tendon(s) of the rotator cuff of right shoulder, initial encounter: Secondary | ICD-10-CM | POA: Diagnosis not present

## 2024-03-14 DIAGNOSIS — M25511 Pain in right shoulder: Secondary | ICD-10-CM | POA: Diagnosis not present

## 2024-03-14 DIAGNOSIS — Z96611 Presence of right artificial shoulder joint: Secondary | ICD-10-CM | POA: Diagnosis not present

## 2024-03-14 DIAGNOSIS — Z9889 Other specified postprocedural states: Secondary | ICD-10-CM | POA: Diagnosis not present

## 2024-03-14 DIAGNOSIS — Z471 Aftercare following joint replacement surgery: Secondary | ICD-10-CM | POA: Diagnosis not present

## 2024-03-14 NOTE — Progress Notes (Signed)
 Orthopaedic Surgery Clinic Visit 03/14/2024   Assessment and Plan from Last Clinic Visit: ___________________________________________________________________________________  Assessment/Plan: 1. S/P shoulder surgery (reverse TSA)      2. Traumatic complete tear of right rotator cuff       Patient seen with Dr. Yvone.  Overall he is doing well.  He may continue to advance his activity and range of motion as tolerated by pain.  Patient and his wife are agreeable and happy with this plan.  Follow up in 6 weeks, 2v XOA right shoulder  Olen Corner, PA-C Orthopaedic Surgery Physician Assistant  12/28/2023 11:13 AM ___________________________________________________________________________________  DATE OF SURGERY:  11/17/2023 DIAGNOSIS:  Right shoulder rotator cuff tear arthropathy. PROCEDURE PERFORMED:     1) Right shoulder reverse total shoulder arthroplasty.  (CPT = N7759771)   2) Right shoulder open long head of biceps tenotomy. (CPT = [id number]) Surgeons and Role: Morene Yvone, MD - Primary; Tia R. Wisdo, PA-C - First assist (qualified resident unavailable for the level of case complexity)   HPI: Returns 4 months out from his RSA, states pain is better.  ROM is improving.  No new injuries.     HPI from [facility]: Out patient returns approximately 6 weeks status post right reverse TSA.  Overall patient states he is doing well.  He endorses mild pain but states it is improving.  He states that the shoulder feels significantly better than it did preoperatively.  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  He is happy with his progress so far.  No new injuries or complaints.  HPI from [facility]:  Patient returns approximately 2 weeks status post right reverse TSA, open long head of biceps tenotomy.  Overall patient states he is doing well.  He is doing much better than he was preoperatively.  He explains that he suffered significantly from the shoulder pain which occurred as a result of his  traumatic fall, however, his shoulder surgery was delayed for several months due to needing a neck surgery, which had to be done first.  He then had to recover from the neck surgery before being able to get the shoulder surgery, so he is very thankful to finally have had the surgery done and to be on the recovery side of things.  He has only been taking his baseline methadone  10 mg 1-2 times daily.  He has been compliant with wearing the sling and coming out several times daily for elbow, wrist and digit range of motion exercises. Otherwise doing well, no new complaints.  Physical exam: Vitals:   03/14/24 1425  BP: (!) 106/58  Pulse: 60  Temp: 97.5 F (36.4 C)     Appearance: healthy, no acute distress, well-groomed HEENT: EOMI, mucous membranes moist CV: RRR Pulm: breathing comfortably  RIGHT Upper Extremity / Shoulder Inspection: Incisions healing well.  No signs infection.  Otherwise, no gross deformity or other signs of trauma  No signs of infection or CRPS (no allodynia, no hyperpathia)  Active forward flexion 125, active external rotation 45, internally rotates to the back pocket.  Able to flex/ext/abd/add all digits with symmetric ROM and strength Able to flex/ext wrist and elbow with symmetric ROM and strength  Sensation: LT sensation present Ax/Msc/M/R/U Vascular: Brisk CR, warm / well-perfused digits. Lymph: no palpable epitrochlear nodes  Imaging: I have independently evaluated these radiographic studies.    11/30/2023 2v right shoulder XRs: s/p reverse TSA with components in good position and no hardware complications noted.   12/28/2023 2v right  shoulder XRs: Status post reverse TSA with components in good position and no hardware complications noted.  03/14/2024 right shoulder XR, 2V: Status post RSA, components in good position, no hardware complications.  Assessment/Plan: 1. S/P shoulder surgery (reverse TSA)      2. Traumatic complete tear of right rotator cuff        Patient seen with Dr. Yvone.  Overall he is doing well.  He may continue to advance his activity and range of motion as tolerated by pain.  Patient and his wife are agreeable and happy with this plan.  Follow-up March 2026 for the 1 year postop anniversary, 2v XOA right shoulder and ASES score  Morene Yvone, MD Hand, Elbow, Shoulder Surgery Dept. Orthopaedic Surgery [facility] 03/14/2024 2:30 PM  Electronically signed by: Morene Yvone, MD 03/14/2024 2:30 PM

## 2024-03-18 ENCOUNTER — Other Ambulatory Visit (HOSPITAL_COMMUNITY): Payer: Self-pay

## 2024-03-18 ENCOUNTER — Other Ambulatory Visit: Payer: Self-pay

## 2024-03-18 ENCOUNTER — Other Ambulatory Visit (HOSPITAL_BASED_OUTPATIENT_CLINIC_OR_DEPARTMENT_OTHER): Payer: Self-pay

## 2024-03-18 MED ORDER — EPINEPHRINE 0.3 MG/0.3ML IJ SOAJ
0.3000 mg | INTRAMUSCULAR | 1 refills | Status: AC
  Filled 2024-03-18 (×2): qty 2, 20d supply, fill #0

## 2024-03-18 MED ORDER — JOURNAVX 50 MG PO TABS
50.0000 mg | ORAL_TABLET | Freq: Two times a day (BID) | ORAL | 11 refills | Status: DC
  Filled 2024-03-18 (×2): qty 60, 30d supply, fill #0
  Filled 2024-04-18: qty 60, 30d supply, fill #1

## 2024-03-19 ENCOUNTER — Other Ambulatory Visit (HOSPITAL_BASED_OUTPATIENT_CLINIC_OR_DEPARTMENT_OTHER): Payer: Self-pay

## 2024-03-19 MED ORDER — PAXLOVID (300/100) 20 X 150 MG & 10 X 100MG PO TBPK
3.0000 | ORAL_TABLET | Freq: Two times a day (BID) | ORAL | 0 refills | Status: AC
  Filled 2024-03-19: qty 30, 5d supply, fill #0

## 2024-03-26 ENCOUNTER — Other Ambulatory Visit (HOSPITAL_BASED_OUTPATIENT_CLINIC_OR_DEPARTMENT_OTHER): Payer: Self-pay

## 2024-03-27 ENCOUNTER — Other Ambulatory Visit (HOSPITAL_BASED_OUTPATIENT_CLINIC_OR_DEPARTMENT_OTHER): Payer: Self-pay

## 2024-03-27 ENCOUNTER — Other Ambulatory Visit (HOSPITAL_COMMUNITY): Payer: Self-pay

## 2024-03-27 MED ORDER — OZEMPIC (1 MG/DOSE) 4 MG/3ML ~~LOC~~ SOPN
1.0000 mg | PEN_INJECTOR | SUBCUTANEOUS | 0 refills | Status: AC
  Filled 2024-03-27: qty 3, 28d supply, fill #0

## 2024-03-28 ENCOUNTER — Other Ambulatory Visit (HOSPITAL_BASED_OUTPATIENT_CLINIC_OR_DEPARTMENT_OTHER): Payer: Self-pay

## 2024-03-28 DIAGNOSIS — E7800 Pure hypercholesterolemia, unspecified: Secondary | ICD-10-CM | POA: Diagnosis not present

## 2024-03-28 DIAGNOSIS — D649 Anemia, unspecified: Secondary | ICD-10-CM | POA: Diagnosis not present

## 2024-03-28 DIAGNOSIS — D352 Benign neoplasm of pituitary gland: Secondary | ICD-10-CM | POA: Diagnosis not present

## 2024-03-28 DIAGNOSIS — E039 Hypothyroidism, unspecified: Secondary | ICD-10-CM | POA: Diagnosis not present

## 2024-03-28 DIAGNOSIS — J45909 Unspecified asthma, uncomplicated: Secondary | ICD-10-CM | POA: Diagnosis not present

## 2024-03-28 DIAGNOSIS — E1169 Type 2 diabetes mellitus with other specified complication: Secondary | ICD-10-CM | POA: Diagnosis not present

## 2024-03-28 DIAGNOSIS — Z79899 Other long term (current) drug therapy: Secondary | ICD-10-CM | POA: Diagnosis not present

## 2024-03-28 DIAGNOSIS — I10 Essential (primary) hypertension: Secondary | ICD-10-CM | POA: Diagnosis not present

## 2024-03-28 DIAGNOSIS — Z0000 Encounter for general adult medical examination without abnormal findings: Secondary | ICD-10-CM | POA: Diagnosis not present

## 2024-03-28 MED ORDER — BENAZEPRIL HCL 20 MG PO TABS
20.0000 mg | ORAL_TABLET | Freq: Every day | ORAL | 3 refills | Status: AC
  Filled 2024-03-28 – 2024-03-29 (×2): qty 90, 90d supply, fill #0
  Filled 2024-06-11: qty 90, 90d supply, fill #1
  Filled 2024-09-14: qty 90, 90d supply, fill #2

## 2024-03-28 MED ORDER — ZOLPIDEM TARTRATE 5 MG PO TABS
5.0000 mg | ORAL_TABLET | Freq: Every evening | ORAL | 0 refills | Status: DC | PRN
  Filled 2024-03-28: qty 30, 30d supply, fill #0

## 2024-03-28 MED ORDER — OZEMPIC (2 MG/DOSE) 8 MG/3ML ~~LOC~~ SOPN
2.0000 mg | PEN_INJECTOR | SUBCUTANEOUS | 11 refills | Status: AC
  Filled 2024-03-28 (×2): qty 3, 28d supply, fill #0
  Filled 2024-04-18: qty 3, 28d supply, fill #1
  Filled 2024-05-11 (×2): qty 3, 28d supply, fill #2
  Filled 2024-06-03: qty 3, 28d supply, fill #3
  Filled 2024-07-02: qty 3, 28d supply, fill #4
  Filled 2024-07-30: qty 3, 28d supply, fill #5
  Filled 2024-08-19 – 2024-08-20 (×2): qty 3, 28d supply, fill #6
  Filled 2024-09-14: qty 3, 28d supply, fill #7

## 2024-03-29 ENCOUNTER — Other Ambulatory Visit (HOSPITAL_BASED_OUTPATIENT_CLINIC_OR_DEPARTMENT_OTHER): Payer: Self-pay

## 2024-03-29 ENCOUNTER — Other Ambulatory Visit: Payer: Self-pay

## 2024-03-29 MED ORDER — PEG 3350-KCL-NA BICARB-NACL 420 G PO SOLR
ORAL | 0 refills | Status: AC
  Filled 2024-03-29: qty 4000, 1d supply, fill #0

## 2024-03-29 MED ORDER — DULCOLAX 5 MG PO TBEC
DELAYED_RELEASE_TABLET | ORAL | 0 refills | Status: AC
  Filled 2024-03-29: qty 4, 1d supply, fill #0

## 2024-04-02 ENCOUNTER — Other Ambulatory Visit (HOSPITAL_BASED_OUTPATIENT_CLINIC_OR_DEPARTMENT_OTHER): Payer: Self-pay

## 2024-04-08 ENCOUNTER — Other Ambulatory Visit (HOSPITAL_BASED_OUTPATIENT_CLINIC_OR_DEPARTMENT_OTHER): Payer: Self-pay

## 2024-04-09 ENCOUNTER — Other Ambulatory Visit (HOSPITAL_BASED_OUTPATIENT_CLINIC_OR_DEPARTMENT_OTHER): Payer: Self-pay

## 2024-04-09 MED ORDER — ALBUTEROL SULFATE HFA 108 (90 BASE) MCG/ACT IN AERS
2.0000 | INHALATION_SPRAY | RESPIRATORY_TRACT | 0 refills | Status: AC
  Filled 2024-04-09: qty 20.1, 75d supply, fill #0

## 2024-04-09 MED ORDER — FLUTICASONE PROPIONATE HFA 110 MCG/ACT IN AERO
2.0000 | INHALATION_SPRAY | Freq: Two times a day (BID) | RESPIRATORY_TRACT | 0 refills | Status: AC | PRN
  Filled 2024-04-09: qty 12, 30d supply, fill #0

## 2024-04-10 ENCOUNTER — Other Ambulatory Visit (HOSPITAL_BASED_OUTPATIENT_CLINIC_OR_DEPARTMENT_OTHER): Payer: Self-pay

## 2024-04-10 MED ORDER — QVAR REDIHALER 80 MCG/ACT IN AERB
2.0000 | INHALATION_SPRAY | Freq: Every day | RESPIRATORY_TRACT | 3 refills | Status: AC | PRN
  Filled 2024-04-10: qty 10.6, 30d supply, fill #0
  Filled 2024-05-21: qty 10.6, 30d supply, fill #1
  Filled 2024-07-05: qty 10.6, 30d supply, fill #2
  Filled 2024-09-02: qty 10.6, 30d supply, fill #3

## 2024-04-11 ENCOUNTER — Other Ambulatory Visit (HOSPITAL_BASED_OUTPATIENT_CLINIC_OR_DEPARTMENT_OTHER): Payer: Self-pay

## 2024-04-12 ENCOUNTER — Other Ambulatory Visit: Payer: Self-pay

## 2024-04-15 ENCOUNTER — Other Ambulatory Visit (HOSPITAL_COMMUNITY): Payer: Self-pay

## 2024-04-15 ENCOUNTER — Other Ambulatory Visit (HOSPITAL_BASED_OUTPATIENT_CLINIC_OR_DEPARTMENT_OTHER): Payer: Self-pay

## 2024-04-15 MED ORDER — ATORVASTATIN CALCIUM 20 MG PO TABS
20.0000 mg | ORAL_TABLET | Freq: Every day | ORAL | 0 refills | Status: AC
  Filled 2024-05-21: qty 90, 90d supply, fill #0

## 2024-04-16 ENCOUNTER — Other Ambulatory Visit (HOSPITAL_COMMUNITY): Payer: Self-pay

## 2024-04-16 ENCOUNTER — Other Ambulatory Visit (HOSPITAL_BASED_OUTPATIENT_CLINIC_OR_DEPARTMENT_OTHER): Payer: Self-pay

## 2024-04-16 DIAGNOSIS — M961 Postlaminectomy syndrome, not elsewhere classified: Secondary | ICD-10-CM | POA: Diagnosis not present

## 2024-04-16 DIAGNOSIS — Z79891 Long term (current) use of opiate analgesic: Secondary | ICD-10-CM | POA: Diagnosis not present

## 2024-04-16 MED ORDER — METHADONE HCL 10 MG PO TABS: 10.0000 mg | ORAL_TABLET | Freq: Three times a day (TID) | ORAL | 0 refills | Status: DC

## 2024-04-16 MED ORDER — METHADONE HCL 10 MG PO TABS
10.0000 mg | ORAL_TABLET | Freq: Three times a day (TID) | ORAL | 0 refills | Status: AC
  Filled 2024-06-03: qty 90, 30d supply, fill #0

## 2024-04-16 MED ORDER — METHADONE HCL 10 MG PO TABS
10.0000 mg | ORAL_TABLET | Freq: Three times a day (TID) | ORAL | 0 refills | Status: AC
  Filled 2024-10-02: qty 90, 30d supply, fill #0

## 2024-04-16 MED ORDER — JOURNAVX 50 MG PO TABS
50.0000 mg | ORAL_TABLET | Freq: Two times a day (BID) | ORAL | 0 refills | Status: DC
  Filled 2024-04-16 – 2024-04-18 (×2): qty 28, 14d supply, fill #0

## 2024-04-16 MED ORDER — METHADONE HCL 10 MG PO TABS
10.0000 mg | ORAL_TABLET | Freq: Three times a day (TID) | ORAL | 0 refills | Status: AC
  Filled 2024-07-02: qty 90, 30d supply, fill #0

## 2024-04-18 ENCOUNTER — Other Ambulatory Visit: Payer: Self-pay

## 2024-04-18 ENCOUNTER — Other Ambulatory Visit (HOSPITAL_BASED_OUTPATIENT_CLINIC_OR_DEPARTMENT_OTHER): Payer: Self-pay

## 2024-04-18 MED ORDER — LEVOTHYROXINE SODIUM 125 MCG PO TABS
125.0000 ug | ORAL_TABLET | Freq: Every morning | ORAL | 3 refills | Status: AC
  Filled 2024-04-18: qty 90, 90d supply, fill #0
  Filled 2024-04-18: qty 60, 60d supply, fill #0
  Filled 2024-06-11: qty 60, 60d supply, fill #1
  Filled 2024-08-11: qty 60, 60d supply, fill #2
  Filled 2024-10-02: qty 60, 60d supply, fill #3

## 2024-04-19 ENCOUNTER — Other Ambulatory Visit (HOSPITAL_BASED_OUTPATIENT_CLINIC_OR_DEPARTMENT_OTHER): Payer: Self-pay

## 2024-04-29 ENCOUNTER — Other Ambulatory Visit (HOSPITAL_BASED_OUTPATIENT_CLINIC_OR_DEPARTMENT_OTHER): Payer: Self-pay

## 2024-05-07 DIAGNOSIS — H4720 Unspecified optic atrophy: Secondary | ICD-10-CM | POA: Diagnosis not present

## 2024-05-07 DIAGNOSIS — H52203 Unspecified astigmatism, bilateral: Secondary | ICD-10-CM | POA: Diagnosis not present

## 2024-05-07 DIAGNOSIS — H524 Presbyopia: Secondary | ICD-10-CM | POA: Diagnosis not present

## 2024-05-07 DIAGNOSIS — Z961 Presence of intraocular lens: Secondary | ICD-10-CM | POA: Diagnosis not present

## 2024-05-07 DIAGNOSIS — H43813 Vitreous degeneration, bilateral: Secondary | ICD-10-CM | POA: Diagnosis not present

## 2024-05-07 DIAGNOSIS — H35372 Puckering of macula, left eye: Secondary | ICD-10-CM | POA: Diagnosis not present

## 2024-05-07 DIAGNOSIS — H04123 Dry eye syndrome of bilateral lacrimal glands: Secondary | ICD-10-CM | POA: Diagnosis not present

## 2024-05-11 ENCOUNTER — Other Ambulatory Visit (HOSPITAL_BASED_OUTPATIENT_CLINIC_OR_DEPARTMENT_OTHER): Payer: Self-pay

## 2024-05-21 DIAGNOSIS — Z09 Encounter for follow-up examination after completed treatment for conditions other than malignant neoplasm: Secondary | ICD-10-CM | POA: Diagnosis not present

## 2024-05-21 DIAGNOSIS — K5730 Diverticulosis of large intestine without perforation or abscess without bleeding: Secondary | ICD-10-CM | POA: Diagnosis not present

## 2024-05-21 DIAGNOSIS — D120 Benign neoplasm of cecum: Secondary | ICD-10-CM | POA: Diagnosis not present

## 2024-05-21 DIAGNOSIS — K649 Unspecified hemorrhoids: Secondary | ICD-10-CM | POA: Diagnosis not present

## 2024-05-21 DIAGNOSIS — D122 Benign neoplasm of ascending colon: Secondary | ICD-10-CM | POA: Diagnosis not present

## 2024-05-21 DIAGNOSIS — D123 Benign neoplasm of transverse colon: Secondary | ICD-10-CM | POA: Diagnosis not present

## 2024-05-21 DIAGNOSIS — Z860101 Personal history of adenomatous and serrated colon polyps: Secondary | ICD-10-CM | POA: Diagnosis not present

## 2024-05-22 ENCOUNTER — Other Ambulatory Visit (HOSPITAL_BASED_OUTPATIENT_CLINIC_OR_DEPARTMENT_OTHER): Payer: Self-pay

## 2024-05-22 MED ORDER — ZOLPIDEM TARTRATE 5 MG PO TABS
5.0000 mg | ORAL_TABLET | Freq: Every evening | ORAL | 0 refills | Status: DC | PRN
  Filled 2024-05-22: qty 30, 30d supply, fill #0

## 2024-06-03 ENCOUNTER — Other Ambulatory Visit (HOSPITAL_BASED_OUTPATIENT_CLINIC_OR_DEPARTMENT_OTHER): Payer: Self-pay

## 2024-06-05 ENCOUNTER — Other Ambulatory Visit (HOSPITAL_BASED_OUTPATIENT_CLINIC_OR_DEPARTMENT_OTHER): Payer: Self-pay

## 2024-06-06 ENCOUNTER — Other Ambulatory Visit (HOSPITAL_BASED_OUTPATIENT_CLINIC_OR_DEPARTMENT_OTHER): Payer: Self-pay

## 2024-06-06 MED ORDER — METHOCARBAMOL 750 MG PO TABS
750.0000 mg | ORAL_TABLET | Freq: Three times a day (TID) | ORAL | 11 refills | Status: AC
  Filled 2024-06-06: qty 90, 30d supply, fill #0

## 2024-06-07 ENCOUNTER — Emergency Department (HOSPITAL_BASED_OUTPATIENT_CLINIC_OR_DEPARTMENT_OTHER)

## 2024-06-07 ENCOUNTER — Emergency Department (HOSPITAL_BASED_OUTPATIENT_CLINIC_OR_DEPARTMENT_OTHER)
Admission: EM | Admit: 2024-06-07 | Discharge: 2024-06-07 | Disposition: A | Discharge status: Home / Self Care | Attending: Emergency Medicine | Admitting: Emergency Medicine

## 2024-06-07 ENCOUNTER — Encounter (HOSPITAL_BASED_OUTPATIENT_CLINIC_OR_DEPARTMENT_OTHER): Payer: Self-pay

## 2024-06-07 ENCOUNTER — Other Ambulatory Visit (HOSPITAL_BASED_OUTPATIENT_CLINIC_OR_DEPARTMENT_OTHER): Payer: Self-pay

## 2024-06-07 ENCOUNTER — Other Ambulatory Visit: Payer: Self-pay

## 2024-06-07 DIAGNOSIS — Z7982 Long term (current) use of aspirin: Secondary | ICD-10-CM | POA: Diagnosis not present

## 2024-06-07 DIAGNOSIS — M5416 Radiculopathy, lumbar region: Secondary | ICD-10-CM | POA: Diagnosis not present

## 2024-06-07 DIAGNOSIS — M545 Low back pain, unspecified: Secondary | ICD-10-CM | POA: Diagnosis present

## 2024-06-07 DIAGNOSIS — M5136 Other intervertebral disc degeneration, lumbar region with discogenic back pain only: Secondary | ICD-10-CM | POA: Diagnosis not present

## 2024-06-07 DIAGNOSIS — R200 Anesthesia of skin: Secondary | ICD-10-CM | POA: Diagnosis not present

## 2024-06-07 DIAGNOSIS — R202 Paresthesia of skin: Secondary | ICD-10-CM | POA: Diagnosis not present

## 2024-06-07 DIAGNOSIS — M48061 Spinal stenosis, lumbar region without neurogenic claudication: Secondary | ICD-10-CM | POA: Diagnosis not present

## 2024-06-07 MED ORDER — PREDNISONE 50 MG PO TABS
50.0000 mg | ORAL_TABLET | Freq: Once | ORAL | Status: AC
  Administered 2024-06-07: 50 mg via ORAL
  Filled 2024-06-07: qty 1

## 2024-06-07 MED ORDER — HYDROMORPHONE HCL 1 MG/ML IJ SOLN
1.0000 mg | Freq: Once | INTRAMUSCULAR | Status: AC
  Administered 2024-06-07: 1 mg via INTRAMUSCULAR
  Filled 2024-06-07: qty 1

## 2024-06-07 MED ORDER — PREDNISONE 10 MG PO TABS
50.0000 mg | ORAL_TABLET | Freq: Every day | ORAL | 0 refills | Status: AC
Start: 2024-06-07 — End: 2024-06-09
  Filled 2024-06-07: qty 10, 2d supply, fill #0

## 2024-06-07 NOTE — ED Triage Notes (Signed)
 Pt caox4 c/o lower back pain radiating down R leg with numbness/tingling. Pt states pain has worsened since Monday when he was doing yardwork. Sees pain management for back, has had back surgery years ago.

## 2024-06-07 NOTE — ED Provider Notes (Signed)
 [name] [facility]ERGENCY DEPARTMENT AT [facility] Provider Note   CSN: [id number] Arrival date & time: 06/07/24  1021     Patient presents with: Back Pain   Roger Durham is a 68 y.o. male.   HPI Patient reports he has prior history of distant back surgery with laminectomy and cage stabilization.  Patient reports he does have chronic pain and is treated with methadone .  Typically however he is functional and active.  He reports he was working in the yard planting some plants the past 2 days.  He did not have a specific injury.  However, he reports that he started to get an intense pain from the right SI region radiating around to the hip and anterior leg toward the knee.  He reports has become excruciating and has been unable to rest or sleep.  His regular pain medications are not helping.  Patient contacted his neurosurgery team and was told to come to the emergency department to get an MRI.    Prior to Admission medications   Medication Sig Start Date End Date Taking? Authorizing Provider  acetaminophen  (TYLENOL ) 500 MG tablet Take 1,000 mg by mouth every 6 (six) hours as needed for pain.    [provider]  albuterol  (VENTOLIN  HFA) 108 (90 Base) MCG/ACT inhaler Inhale 2 puffs into the lungs every 4 (four) hours as needed 12/23/21     albuterol  (VENTOLIN  HFA) 108 (90 Base) MCG/ACT inhaler Inhale 2 puffs into the lungs every 4 (four) hours as needed. 03/02/22     albuterol  (VENTOLIN  HFA) 108 (90 Base) MCG/ACT inhaler Inhale 2 puffs into the lungs every 4 (four) hours as needed. 09/19/22     albuterol  (VENTOLIN  HFA) 108 (90 Base) MCG/ACT inhaler Inhale 2 puffs into the lungs every 4 (four) hours. 04/09/24     amLODipine -benazepril  (LOTREL ) 5-10 MG capsule TAKE 1 CAPSULE BY MOUTH ONCE A DAY 08/18/20 08/18/21  Signa Rush, MD  amLODipine -benazepril  (LOTREL ) 5-10 MG per capsule Take 1 capsule by mouth every morning.    [provider]  amLODipine -benazepril  (LOTREL ) 5-20 MG  capsule Take 1 capsule by mouth daily. 01/05/23     amLODipine -benazepril  (LOTREL ) 5-20 MG capsule Take 1 capsule by mouth 2 (two) times daily. 11/23/23     amoxicillin -clavulanate (AUGMENTIN ) 875-125 MG tablet Take 1 tablet by mouth every 12 (twelve) hours. 08/22/23     aspirin  81 MG tablet Take 1 tablet (81 mg total) by mouth daily. 10/17/18   Nieves Cough, MD  atorvastatin  (LIPITOR) 10 MG tablet Take 10 mg by mouth at bedtime.    [provider]  atorvastatin  (LIPITOR) 20 MG tablet Take 1 tablet (20 mg total) by mouth daily. 03/02/22     atorvastatin  (LIPITOR) 20 MG tablet Take 1 tablet (20 mg total) by mouth daily. 04/15/24     beclomethasone (QVAR  REDIHALER) 80 MCG/ACT inhaler Inhale 2 puffs into the lungs daily if needed 08/03/21     beclomethasone (QVAR  REDIHALER) 80 MCG/ACT inhaler Inhale 2 puffs into the lungs daily as needed. 04/10/24     benazepril  (LOTENSIN ) 20 MG tablet Take 1 tablet (20 mg total) by mouth daily. 03/28/24     bisacodyl  (DULCOLAX) 5 MG EC tablet 2 tablets Orally as directed take with trilyle for prep 03/29/24     cephALEXin  (KEFLEX ) 500 MG capsule Take 1 capsule (500 mg total) by mouth 3 (three) times daily. 05/17/23   Raford Lenis, MD  COVID-19 At Home Antigen Test [facility] COVID-19 HOME TEST) KIT Use  as directed 10/11/21   Deane Garnette SAUNDERS, RPH  COVID-19 At Home Antigen Test [facility] AT-HOME COVID-19 TEST) KIT Use as directed. 05/20/21   Deane Garnette SAUNDERS, RPH  diazepam  (VALIUM ) 2 MG tablet Take 1-2 tablets by mouth up to twice daily as needed for anxiety for 2 days. 03/20/23     diclofenac  Sodium (VOLTAREN ) 1 % GEL Apply 1 application topically 4 (four) times daily as needed. 02/10/21     diclofenac  Sodium (VOLTAREN ) 1 % GEL Apply 4 g topically to lower extremity joint 4 (four) times daily as needed 03/31/22     diphenhydrAMINE  (BENADRYL ) 25 mg capsule Take 25 mg by mouth as needed for allergies.    [provider]  DULoxetine  (CYMBALTA ) 20 MG capsule Take 1  capsule (20 mg total) by mouth daily. 11/18/21     DULoxetine  (CYMBALTA ) 20 MG capsule Take 1 capsule (20 mg total) by mouth daily. 02/16/22     DULoxetine  (CYMBALTA ) 20 MG capsule Take 1 capsule (20 mg total) by mouth daily. 03/28/23     EPINEPHrine  (EPIPEN  2-PAK) 0.3 mg/0.3 mL IJ SOAJ injection Inject as directed as needed. 12/14/21     EPINEPHrine  0.3 mg/0.3 mL IJ SOAJ injection Inject 0.3 mg into the muscle as needed for anaphylaxis or allergies. 08/01/16   [provider]  EPINEPHrine  0.3 mg/0.3 mL IJ SOAJ injection use as directed as needed 12/31/20     EPINEPHrine  0.3 mg/0.3 mL IJ SOAJ injection Inject 0.3 mg as directed as needed for allergic reaction. 03/18/24     famotidine  (PEPCID ) 20 MG tablet Take 1 tablet (20 mg total) by mouth 2 (two) times a day. For GI protection while on Prednisone  06/03/23     fluticasone  (FLOVENT  HFA) 110 MCG/ACT inhaler Inhale 2 puffs into the lungs 2 (two) times daily. 08/17/21     fluticasone  (FLOVENT  HFA) 110 MCG/ACT inhaler Inhale 2 puffs into the lungs 2 (two) times daily as needed. 12/23/21     fluticasone  (FLOVENT  HFA) 110 MCG/ACT inhaler Inhale 2 puffs into the lungs 2 (two) times daily. 03/02/22     fluticasone  (FLOVENT  HFA) 110 MCG/ACT inhaler Inhale 2 puffs into the lungs 2 (two) times daily. 09/15/22   Charlott Dorn LABOR, MD  fluticasone  (FLOVENT  HFA) 110 MCG/ACT inhaler Inhale 2 puffs into the lungs 2 (two) times daily. 09/19/22     fluticasone  (FLOVENT  HFA) 110 MCG/ACT inhaler Inhale 2 puffs into the lungs 2 (two) times daily as needed. 04/09/24     influenza vaccine adjuvanted (FLUAD  QUADRIVALENT) 0.5 ML injection Inject 0.5 mLs into the muscle. 05/26/22   Luiz Channel, MD  levothyroxine  (SYNTHROID ) 112 MCG tablet Take 1 tablet (112 mcg total) by mouth daily in the morning on an empty stomach 07/19/21     levothyroxine  (SYNTHROID ) 125 MCG tablet Take 1 tablet (125 mcg total) by mouth every morning on an empty stomach. 07/05/23     levothyroxine   (SYNTHROID ) 125 MCG tablet Take 1 tablet (125 mcg total) by mouth daily before breakfast on an empty stomach. 07/05/23     levothyroxine  (SYNTHROID ) 125 MCG tablet Take 1 tablet (125 mcg total) by mouth every morning on an empty stomach. 04/18/24     methadone  (DOLOPHINE ) 10 MG tablet Take 1 tablet (10 mg total) by mouth every 8 (eight) hours for chronic pain. 03/21/21     methadone  (DOLOPHINE ) 10 MG tablet Take 1 tablet by mouth every 8 hours as needed for chronic pain (DNF 02/20/21) 02/20/21     methadone  (  DOLOPHINE ) 10 MG tablet Take 1 tablet (10 mg total) by mouth every 8 (eight) hours for chronic pain 06/04/21     methadone  (DOLOPHINE ) 10 MG tablet Take 1 tablet (10 mg total) by mouth every 8 (eight) hours for chronic pain 07/03/21     methadone  (DOLOPHINE ) 10 MG tablet Take 1 tablet (10 mg total) by mouth every 8 (eight) hours for chronic pain 05/05/21     methadone  (DOLOPHINE ) 10 MG tablet Take 1 tablet (10 mg total) by mouth every 8 (eight) hours for chronic pain 09/09/21     methadone  (DOLOPHINE ) 10 MG tablet Take 1 tablet (10 mg total) by mouth every 8 (eight) hours for chronic pain (DNF 08/11/21) 08/11/21     methadone  (DOLOPHINE ) 10 MG tablet Take 1 tablet (10 mg total) by mouth every 8 (eight) hours for chronic pain 11/18/21     methadone  (DOLOPHINE ) 10 MG tablet Take 1 tablet (10 mg total) by mouth every 8 (eight) hours for chronic pain 01/16/22     methadone  (DOLOPHINE ) 10 MG tablet Take 1 tablet (10 mg total) by mouth every 8 (eight) hours for chronic pain 12/18/21     methadone  (DOLOPHINE ) 10 MG tablet Take 1 tablet (10 mg total) by mouth every 8 (eight) hours for chronic pain 03/18/22     methadone  (DOLOPHINE ) 10 MG tablet Take 1 tablet (10 mg total) by mouth every 8 (eight) hours for chronic pain 04/17/22     methadone  (DOLOPHINE ) 10 MG tablet Take 1 tablet (10 mg total) by mouth every 8 (eight) hours for chronic pain 02/17/22     methadone  (DOLOPHINE ) 10 MG tablet Take 1 tablet (10 mg total) by mouth  every 8 (eight) hours for chronic pain 05/27/22     methadone  (DOLOPHINE ) 10 MG tablet Take 1 tablet (10 mg total) by mouth every 8 (eight) hours for chronic pain 06/25/22     methadone  (DOLOPHINE ) 10 MG tablet Take 1 tablet (10 mg total) by mouth every 8 (eight) hours as needed for chronic pain (07-25-22) 07/25/22     methadone  (DOLOPHINE ) 10 MG tablet Take 1 tablet (10 mg total) by mouth every 8 (eight) hours for chronic pain 09/29/22 09/29/22     methadone  (DOLOPHINE ) 10 MG tablet Take 1 tablet (10 mg total) by mouth every 8 (eight) hours for chronic pain 10/29/22     methadone  (DOLOPHINE ) 10 MG tablet Take 1 tablet (10 mg total) by mouth every 8 (eight) hours for chronic pain 08/30/22 08/30/22     methadone  (DOLOPHINE ) 10 MG tablet Take 1 tablet (10 mg total) by mouth every 8 (eight) hours for chronic pain (DNF 11/26/22) 11/26/22     methadone  (DOLOPHINE ) 10 MG tablet Take 1 tablet (10 mg total) by mouth every 8 (eight) hours for chronic pain (DNF 12/26/22) 12/26/22     methadone  (DOLOPHINE ) 10 MG tablet Take 1 tablet (10 mg total) by mouth every 8 (eight) hours for chronic pain 02/03/23     methadone  (DOLOPHINE ) 10 MG tablet Take 1 tablet (10 mg total) by mouth every 8 (eight) hours for chronic pain (DNF 04/03/23) 04/03/23     methadone  (DOLOPHINE ) 10 MG tablet Take 1 tablet (10 mg total) by mouth every 8 (eight) hours for chronic pain 03/05/23     methadone  (DOLOPHINE ) 10 MG tablet Take 1 tablet (10 mg total) by mouth every 8 (eight) hours for chronic pain (DNF 05/17/23) 05/17/23     methadone  (DOLOPHINE ) 10 MG tablet Take 1 tablet (10 mg  total) by mouth every 8 (eight) hours. 07/15/23     methadone  (DOLOPHINE ) 10 MG tablet Take 1 tablet (10 mg total) by mouth every 8 (eight) hours for chronic pain. 06/15/23     methadone  (DOLOPHINE ) 10 MG tablet Take 1 tablet (10 mg total) by mouth every 8 (eight) hours for chronic pain. 09/25/23     methadone  (DOLOPHINE ) 10 MG tablet Take 1 tablet (10 mg total) by mouth every 8  (eight) hours for chronic pain (DNF 10/25/23) 10/25/23     methadone  (DOLOPHINE ) 10 MG tablet Take 1 tablet (10 mg total) by mouth every 8 (eight) hours for chronic pain 08/26/23     methadone  (DOLOPHINE ) 10 MG tablet Take 1 tablet (10 mg total) by mouth every 8 (eight) hours for chronic pain. 12/09/23     methadone  (DOLOPHINE ) 10 MG tablet Take 1 tablet (10 mg total) by mouth every 8 (eight) hours for chronic pain. 01/08/24     methadone  (DOLOPHINE ) 10 MG tablet Take 1 tablet (10 mg total) by mouth every 8 (eight) hours for chronic pain. 03/16/24     methadone  (DOLOPHINE ) 10 MG tablet Take 1 tablet (10 mg total) by mouth every 8 (eight) hours for chronic pain. 02/16/24     methadone  (DOLOPHINE ) 10 MG tablet Take 1 tablet (10 mg total) by mouth every 8 (eight) hours as needed for chronic pain. 04/15/24     methadone  (DOLOPHINE ) 10 MG tablet Take 1 tablet (10 mg total) by mouth every 8 (eight) hours for chronic pain. 07/24/24     methadone  (DOLOPHINE ) 10 MG tablet Take 1 tablet (10 mg total) by mouth every 8 (eight) hours for chronic pain. 06/23/24     methadone  (DOLOPHINE ) 10 MG tablet Take 1 tablet (10 mg total) by mouth every 8 (eight) hours for chronic pain. 05/25/24     methocarbamol  (ROBAXIN ) 750 MG tablet Take 1 tablet (750 mg total) by mouth every 8 (eight) hours. 02/16/22     methocarbamol  (ROBAXIN ) 750 MG tablet Take 1 tablet (750 mg total) by mouth every 8 (eight) hours. 05/17/22     methocarbamol  (ROBAXIN ) 750 MG tablet Take 1 tablet (750 mg total) by mouth every 8 (eight) hours. 08/03/23     methocarbamol  (ROBAXIN ) 750 MG tablet Take 1 tablet (750 mg total) by mouth every 8 (eight) hours. 01/25/24     methocarbamol  (ROBAXIN ) 750 MG tablet Take 1 tablet (750 mg total) by mouth every 8 (eight) hours. 06/06/24     nirmatrelvir /ritonavir  (PAXLOVID , 300/100,) 20 x 150 MG & 10 x 100MG  TBPK Take 3 tablets by mouth 2 (two) times daily. 03/19/24     polyethylene glycol-electrolytes (NULYTELY) 420 g solution Take  4000 mL Orally as directed 03/29/24     predniSONE  (DELTASONE ) 5 MG tablet Take 1 tablet (5 mg total) by mouth daily. 06/03/23     Semaglutide , 1 MG/DOSE, (OZEMPIC , 1 MG/DOSE,) 4 MG/3ML SOPN Inject 1 mg into the skin once a week. 10/18/22     Semaglutide , 1 MG/DOSE, (OZEMPIC , 1 MG/DOSE,) 4 MG/3ML SOPN Inject 1 mg into the skin once a week. 03/27/24     Semaglutide , 2 MG/DOSE, (OZEMPIC , 2 MG/DOSE,) 8 MG/3ML SOPN Inject 2 mg into the skin once a week. 11/23/22     Semaglutide , 2 MG/DOSE, (OZEMPIC , 2 MG/DOSE,) 8 MG/3ML SOPN Inject 2 mg into the skin once a week. 03/28/24     Semaglutide ,0.25 or 0.5MG /DOS, (OZEMPIC , 0.25 OR 0.5 MG/DOSE,) 2 MG/3ML SOPN Inject 0.25 mg into the skin once  a week for 4 weeks 09/19/22     Semaglutide ,0.25 or 0.5MG /DOS, (OZEMPIC , 0.25 OR 0.5 MG/DOSE,) 2 MG/3ML SOPN 0.25mg  once weekly for 2 weeks, then 0.5mg  once weekly for 2 weeks Subcutaneous 30 days 10/05/23     Suzetrigine  (JOURNAVX ) 50 MG TABS First dose: take 2 tablets on an empty stomach, then take 1 tablet (50 mg) by mouth 2 (two) times daily for acute pain. 03/05/24     Suzetrigine  (JOURNAVX ) 50 MG TABS Take 1 tablet (50 mg total) by mouth 2 (two) times a day. 03/18/24     Suzetrigine  (JOURNAVX ) 50 MG TABS Take 50 mg by mouth 2 (two) times daily for 14 days for acute pain. Take first two tablets two (2) hours before procedure. 04/16/24     temazepam  (RESTORIL ) 15 MG capsule Take 1 capsule (15 mg total) by mouth at bedtime as needed. 08/03/21     temazepam  (RESTORIL ) 15 MG capsule Take 1 capsule (15 mg total) by mouth at bedtime as needed. 03/02/22     temazepam  (RESTORIL ) 15 MG capsule Take 1 capsule (15 mg total) by mouth at bedtime as needed. 07/05/23     tirzepatide  (MOUNJARO ) 2.5 MG/0.5ML Pen Inject 2.5 mg into the skin once a week. 03/06/23     tirzepatide  (MOUNJARO ) 5 MG/0.5ML Pen Inject 5 mg into the skin once a week as directed 03/28/23     tirzepatide  (MOUNJARO ) 7.5 MG/0.5ML Pen Inject 7.5 mg into the skin once a week. 04/24/23      Vibegron  (GEMTESA ) 75 MG TABS Take 1 tablet (75 mg total) by mouth daily. 08/24/23     zolpidem  (AMBIEN ) 5 MG tablet Take 1 tablet (5 mg total) by mouth at bedtime as needed. 05/22/24       Allergies: Patient has no active allergies.    Review of Systems  Updated Vital Signs BP (!) 151/85   Pulse 67   Temp 98.2 F (36.8 C) (Oral)   Resp 16   SpO2 97%   Physical Exam Constitutional:      Comments: Alert nontoxic clinically well in appearance.  HENT:     Mouth/Throat:     Pharynx: Oropharynx is clear.  Cardiovascular:     Rate and Rhythm: Normal rate.  Pulmonary:     Effort: Pulmonary effort is normal.     Breath sounds: Normal breath sounds.  Abdominal:     General: There is no distension.     Palpations: Abdomen is soft.  Musculoskeletal:     Comments: No focal reproducible tenderness to palpation in the back.  Patient does endorse that source of pain is more or less at the top of the SI joint on the right.  Then radiation around towards the hip and anterior thigh.  Patient can transition to standing.  He is using a cane.  He is standing with his back flexed forward.  No lower extremity edema.  Calf soft and nontender.  Skin:    General: Skin is warm and dry.  Neurological:     General: No focal deficit present.     Mental Status: He is oriented to person, place, and time.     (all labs ordered are listed, but only abnormal results are displayed) Labs Reviewed - No data to display  EKG: None  Radiology: No results found.   Procedures   Medications Ordered in the ED  HYDROmorphone  (DILAUDID ) injection 1 mg (has no administration in time range)  HYDROmorphone  (DILAUDID ) injection 1 mg (1 mg Intramuscular Given  06/07/24 1355)                                    Medical Decision Making Amount and/or Complexity of Data Reviewed Radiology: ordered.  Risk Prescription drug management.   Patient has history of prior back surgery and chronic pain but acute  significant exacerbation with radiation.  He does take methadone  but has not been able to control pain on normal regimen.  Patient spoke with his provider and was recommended to come to the emergency department for MRI per his report.  At this time patient is neurologically intact but has very antalgic movements and gait.  MRI is available today we will proceed with lumbar MRI.  Dilaudid  1 mg IM given for pain control.  Repeat dose given prior to MRI.  Dr. Gayl to follow-up on MRI results.  I anticipate if no emergent findings are present, patient will be appropriate for discharge.  He does have outpatient providers for pain management.  May need some acute additional pain management.     Final diagnoses:  Lumbar back pain with radiculopathy affecting right lower extremity    ED Discharge Orders     None          Armenta Canning, MD 06/07/24 1554

## 2024-06-07 NOTE — ED Notes (Signed)
 Report received assumed care of patient at this time

## 2024-06-07 NOTE — ED Provider Notes (Signed)
  Physical Exam  BP (!) 151/85   Pulse 67   Temp 98.2 F (36.8 C) (Oral)   Resp 16   SpO2 97%   Physical Exam Vitals and nursing note reviewed.  HENT:     Head: Normocephalic and atraumatic.  Eyes:     Pupils: Pupils are equal, round, and reactive to light.  Cardiovascular:     Rate and Rhythm: Normal rate and regular rhythm.  Pulmonary:     Effort: Pulmonary effort is normal.     Breath sounds: Normal breath sounds.  Abdominal:     Palpations: Abdomen is soft.     Tenderness: There is no abdominal tenderness.  Skin:    General: Skin is warm and dry.  Neurological:     Mental Status: He is alert.  Psychiatric:        Mood and Affect: Mood normal.     Procedures  Procedures  ED Course / MDM   Clinical Course as of 06/07/24 1757  Fri Jun 07, 2024  1756 MRI lumbar spine shows chronic changes.  Some edema over L2-L3 but no fluid collection or be worrisome for abscess.  Reviewed MRI report with the patient and his wife.  Will give a dose of prednisone  here and discharged with a couple days of prednisone  to help with the inflammation.  He will follow-up with his PCP.  Return precautions were worrisome for red flag back pain symptoms were discussed in detail. [MP]    Clinical Course User Index [MP] Pamella Ozell LABOR, DO   Medical Decision Making I, Ozell Pamella DO, have assumed care of this patient from the previous provider pending MRI lumbar spine reevaluation and disposition  Amount and/or Complexity of Data Reviewed Radiology: ordered.  Risk Prescription drug management.          Pamella Ozell LABOR, DO 06/07/24 1757

## 2024-06-07 NOTE — Discharge Instructions (Signed)
 You were seen emerged right for back pain The MRI showed chronic changes and spinal cord narrowing (stenosis) but no acute changes that would require neurosurgeon There are some swelling at the level of L3 For this reason we gave you dose of steroids here and called in a couple more days of steroids for you to pick up in your pharmacy begin taking to help with the swelling and pain Follow-up with your primary doctor Return to the emergency room for severe pain or if you are unable to walk

## 2024-06-11 ENCOUNTER — Other Ambulatory Visit: Payer: Self-pay

## 2024-06-11 ENCOUNTER — Other Ambulatory Visit (HOSPITAL_BASED_OUTPATIENT_CLINIC_OR_DEPARTMENT_OTHER): Payer: Self-pay

## 2024-06-11 DIAGNOSIS — M5416 Radiculopathy, lumbar region: Secondary | ICD-10-CM | POA: Diagnosis not present

## 2024-06-11 DIAGNOSIS — M48061 Spinal stenosis, lumbar region without neurogenic claudication: Secondary | ICD-10-CM | POA: Diagnosis not present

## 2024-06-11 DIAGNOSIS — Z981 Arthrodesis status: Secondary | ICD-10-CM | POA: Diagnosis not present

## 2024-06-11 DIAGNOSIS — M51369 Other intervertebral disc degeneration, lumbar region without mention of lumbar back pain or lower extremity pain: Secondary | ICD-10-CM | POA: Diagnosis not present

## 2024-06-11 MED ORDER — JOURNAVX 50 MG PO TABS
ORAL_TABLET | ORAL | 0 refills | Status: DC
  Filled 2024-06-11: qty 30, 15d supply, fill #0

## 2024-06-11 MED ORDER — CELECOXIB 200 MG PO CAPS
200.0000 mg | ORAL_CAPSULE | Freq: Two times a day (BID) | ORAL | 0 refills | Status: DC
  Filled 2024-06-11: qty 60, 30d supply, fill #0

## 2024-06-12 ENCOUNTER — Other Ambulatory Visit: Payer: Self-pay

## 2024-06-17 ENCOUNTER — Other Ambulatory Visit: Payer: Self-pay

## 2024-06-17 ENCOUNTER — Other Ambulatory Visit (HOSPITAL_BASED_OUTPATIENT_CLINIC_OR_DEPARTMENT_OTHER): Payer: Self-pay

## 2024-06-17 DIAGNOSIS — M48061 Spinal stenosis, lumbar region without neurogenic claudication: Secondary | ICD-10-CM | POA: Diagnosis not present

## 2024-06-17 DIAGNOSIS — M5416 Radiculopathy, lumbar region: Secondary | ICD-10-CM | POA: Diagnosis not present

## 2024-06-17 DIAGNOSIS — Z79899 Other long term (current) drug therapy: Secondary | ICD-10-CM | POA: Diagnosis not present

## 2024-06-17 MED ORDER — PREGABALIN 75 MG PO CAPS
75.0000 mg | ORAL_CAPSULE | Freq: Two times a day (BID) | ORAL | 2 refills | Status: DC
  Filled 2024-06-17: qty 60, 30d supply, fill #0

## 2024-06-27 ENCOUNTER — Other Ambulatory Visit (HOSPITAL_COMMUNITY): Payer: Self-pay

## 2024-06-27 MED ORDER — PREGABALIN 150 MG PO CAPS
150.0000 mg | ORAL_CAPSULE | Freq: Three times a day (TID) | ORAL | 2 refills | Status: DC
  Filled 2024-06-27 – 2024-06-28 (×2): qty 90, 30d supply, fill #0

## 2024-06-28 ENCOUNTER — Other Ambulatory Visit (HOSPITAL_BASED_OUTPATIENT_CLINIC_OR_DEPARTMENT_OTHER): Payer: Self-pay

## 2024-06-28 ENCOUNTER — Other Ambulatory Visit (HOSPITAL_COMMUNITY): Payer: Self-pay

## 2024-07-01 ENCOUNTER — Other Ambulatory Visit (HOSPITAL_COMMUNITY): Payer: Self-pay

## 2024-07-01 DIAGNOSIS — M5416 Radiculopathy, lumbar region: Secondary | ICD-10-CM | POA: Diagnosis not present

## 2024-07-02 ENCOUNTER — Other Ambulatory Visit (HOSPITAL_BASED_OUTPATIENT_CLINIC_OR_DEPARTMENT_OTHER): Payer: Self-pay

## 2024-07-03 ENCOUNTER — Other Ambulatory Visit (HOSPITAL_BASED_OUTPATIENT_CLINIC_OR_DEPARTMENT_OTHER): Payer: Self-pay

## 2024-07-03 DIAGNOSIS — M48061 Spinal stenosis, lumbar region without neurogenic claudication: Secondary | ICD-10-CM | POA: Diagnosis not present

## 2024-07-03 DIAGNOSIS — M4326 Fusion of spine, lumbar region: Secondary | ICD-10-CM | POA: Diagnosis not present

## 2024-07-03 DIAGNOSIS — M5441 Lumbago with sciatica, right side: Secondary | ICD-10-CM | POA: Diagnosis not present

## 2024-07-03 DIAGNOSIS — G8929 Other chronic pain: Secondary | ICD-10-CM | POA: Diagnosis not present

## 2024-07-03 DIAGNOSIS — Z981 Arthrodesis status: Secondary | ICD-10-CM | POA: Diagnosis not present

## 2024-07-03 DIAGNOSIS — M51369 Other intervertebral disc degeneration, lumbar region without mention of lumbar back pain or lower extremity pain: Secondary | ICD-10-CM | POA: Diagnosis not present

## 2024-07-03 DIAGNOSIS — M5416 Radiculopathy, lumbar region: Secondary | ICD-10-CM | POA: Diagnosis not present

## 2024-07-03 MED ORDER — CELECOXIB 200 MG PO CAPS
200.0000 mg | ORAL_CAPSULE | Freq: Two times a day (BID) | ORAL | 0 refills | Status: AC
  Filled 2024-07-03 – 2024-07-04 (×2): qty 60, 30d supply, fill #0

## 2024-07-04 ENCOUNTER — Other Ambulatory Visit (HOSPITAL_BASED_OUTPATIENT_CLINIC_OR_DEPARTMENT_OTHER): Payer: Self-pay

## 2024-07-06 ENCOUNTER — Other Ambulatory Visit (HOSPITAL_BASED_OUTPATIENT_CLINIC_OR_DEPARTMENT_OTHER): Payer: Self-pay

## 2024-07-20 ENCOUNTER — Other Ambulatory Visit (HOSPITAL_BASED_OUTPATIENT_CLINIC_OR_DEPARTMENT_OTHER): Payer: Self-pay

## 2024-07-30 ENCOUNTER — Other Ambulatory Visit (HOSPITAL_BASED_OUTPATIENT_CLINIC_OR_DEPARTMENT_OTHER): Payer: Self-pay

## 2024-08-01 ENCOUNTER — Other Ambulatory Visit (HOSPITAL_BASED_OUTPATIENT_CLINIC_OR_DEPARTMENT_OTHER): Payer: Self-pay

## 2024-08-01 MED ORDER — DICLOFENAC SODIUM 1 % EX GEL
4.0000 g | CUTANEOUS | 3 refills | Status: AC | PRN
  Filled 2024-08-01: qty 100, 30d supply, fill #0
  Filled 2024-08-02: qty 400, 100d supply, fill #0

## 2024-08-01 MED ORDER — METHADONE HCL 10 MG PO TABS
10.0000 mg | ORAL_TABLET | Freq: Three times a day (TID) | ORAL | 0 refills | Status: AC
  Filled 2024-09-02: qty 90, 30d supply, fill #0

## 2024-08-01 MED ORDER — METHADONE HCL 10 MG PO TABS: 10.0000 mg | ORAL_TABLET | Freq: Three times a day (TID) | ORAL | 0 refills | Status: AC | PRN

## 2024-08-01 MED ORDER — METHADONE HCL 10 MG PO TABS
10.0000 mg | ORAL_TABLET | Freq: Three times a day (TID) | ORAL | 0 refills | Status: AC | PRN
  Filled 2024-08-01: qty 90, 30d supply, fill #0

## 2024-08-02 ENCOUNTER — Other Ambulatory Visit (HOSPITAL_BASED_OUTPATIENT_CLINIC_OR_DEPARTMENT_OTHER): Payer: Self-pay

## 2024-08-12 ENCOUNTER — Other Ambulatory Visit: Payer: Self-pay

## 2024-08-12 ENCOUNTER — Other Ambulatory Visit (HOSPITAL_BASED_OUTPATIENT_CLINIC_OR_DEPARTMENT_OTHER): Payer: Self-pay

## 2024-08-13 ENCOUNTER — Other Ambulatory Visit (HOSPITAL_BASED_OUTPATIENT_CLINIC_OR_DEPARTMENT_OTHER): Payer: Self-pay

## 2024-08-13 MED ORDER — ZOLPIDEM TARTRATE 5 MG PO TABS
5.0000 mg | ORAL_TABLET | Freq: Every evening | ORAL | 0 refills | Status: AC | PRN
  Filled 2024-08-13: qty 30, 30d supply, fill #0

## 2024-08-15 ENCOUNTER — Other Ambulatory Visit (HOSPITAL_BASED_OUTPATIENT_CLINIC_OR_DEPARTMENT_OTHER): Payer: Self-pay

## 2024-08-15 MED ORDER — ROSUVASTATIN CALCIUM 10 MG PO TABS
10.0000 mg | ORAL_TABLET | Freq: Every day | ORAL | 3 refills | Status: AC
  Filled 2024-08-15: qty 90, 90d supply, fill #0

## 2024-08-15 MED ORDER — COMIRNATY 30 MCG/0.3ML IM SUSY
0.3000 mL | PREFILLED_SYRINGE | Freq: Once | INTRAMUSCULAR | 0 refills | Status: AC
  Filled 2024-08-15: qty 0.3, 1d supply, fill #0

## 2024-08-19 ENCOUNTER — Other Ambulatory Visit (HOSPITAL_BASED_OUTPATIENT_CLINIC_OR_DEPARTMENT_OTHER): Payer: Self-pay

## 2024-09-02 ENCOUNTER — Other Ambulatory Visit (HOSPITAL_BASED_OUTPATIENT_CLINIC_OR_DEPARTMENT_OTHER): Payer: Self-pay

## 2024-09-09 ENCOUNTER — Other Ambulatory Visit (HOSPITAL_BASED_OUTPATIENT_CLINIC_OR_DEPARTMENT_OTHER): Payer: Self-pay

## 2024-09-09 MED ORDER — JOURNAVX 50 MG PO TABS
ORAL_TABLET | ORAL | 2 refills | Status: AC
  Filled 2024-09-09: qty 30, 14d supply, fill #0

## 2024-09-13 ENCOUNTER — Other Ambulatory Visit (HOSPITAL_BASED_OUTPATIENT_CLINIC_OR_DEPARTMENT_OTHER): Payer: Self-pay

## 2024-09-14 ENCOUNTER — Other Ambulatory Visit (HOSPITAL_BASED_OUTPATIENT_CLINIC_OR_DEPARTMENT_OTHER): Payer: Self-pay

## 2024-09-16 ENCOUNTER — Other Ambulatory Visit (HOSPITAL_BASED_OUTPATIENT_CLINIC_OR_DEPARTMENT_OTHER): Payer: Self-pay

## 2024-09-16 MED ORDER — OZEMPIC (2 MG/DOSE) 8 MG/3ML ~~LOC~~ SOPN
2.0000 mg | PEN_INJECTOR | SUBCUTANEOUS | 11 refills | Status: AC
  Filled 2024-09-16: qty 3, 28d supply, fill #0

## 2024-09-19 ENCOUNTER — Other Ambulatory Visit (HOSPITAL_BASED_OUTPATIENT_CLINIC_OR_DEPARTMENT_OTHER): Payer: Self-pay

## 2024-09-23 ENCOUNTER — Other Ambulatory Visit (HOSPITAL_BASED_OUTPATIENT_CLINIC_OR_DEPARTMENT_OTHER): Payer: Self-pay

## 2024-10-02 ENCOUNTER — Other Ambulatory Visit: Payer: Self-pay

## 2024-10-02 ENCOUNTER — Other Ambulatory Visit (HOSPITAL_BASED_OUTPATIENT_CLINIC_OR_DEPARTMENT_OTHER): Payer: Self-pay

## 2024-10-02 MED ORDER — MOUNJARO 2.5 MG/0.5ML ~~LOC~~ SOAJ
2.5000 mg | SUBCUTANEOUS | 0 refills | Status: AC
  Filled 2024-10-02: qty 2, 28d supply, fill #0

## 2024-10-03 ENCOUNTER — Other Ambulatory Visit (HOSPITAL_BASED_OUTPATIENT_CLINIC_OR_DEPARTMENT_OTHER): Payer: Self-pay
